# Patient Record
Sex: Female | Born: 1965
Health system: Southern US, Community
[De-identification: ages and names within clinical notes are randomized; demographics above are authoritative.]

## PROBLEM LIST (undated history)

## (undated) DIAGNOSIS — Z8742 Personal history of other diseases of the female genital tract: Secondary | ICD-10-CM

## (undated) DIAGNOSIS — E785 Hyperlipidemia, unspecified: Secondary | ICD-10-CM

## (undated) DIAGNOSIS — J329 Chronic sinusitis, unspecified: Secondary | ICD-10-CM

## (undated) DIAGNOSIS — F32A Depression, unspecified: Secondary | ICD-10-CM

## (undated) DIAGNOSIS — R739 Hyperglycemia, unspecified: Secondary | ICD-10-CM

## (undated) DIAGNOSIS — F329 Major depressive disorder, single episode, unspecified: Secondary | ICD-10-CM

## (undated) DIAGNOSIS — N390 Urinary tract infection, site not specified: Secondary | ICD-10-CM

## (undated) HISTORY — DX: Urinary tract infection, site not specified: N39.0

## (undated) HISTORY — PX: CERVICAL BIOPSY: SHX590

## (undated) HISTORY — DX: Personal history of other diseases of the female genital tract: Z87.42

## (undated) HISTORY — DX: Depression, unspecified: F32.A

## (undated) HISTORY — PX: BREAST EXCISIONAL BIOPSY: SUR124

## (undated) HISTORY — DX: Hyperlipidemia, unspecified: E78.5

## (undated) HISTORY — DX: Hyperglycemia, unspecified: R73.9

## (undated) HISTORY — DX: Chronic sinusitis, unspecified: J32.9

## (undated) HISTORY — DX: Major depressive disorder, single episode, unspecified: F32.9

---

## 1985-12-31 HISTORY — PX: BREAST BIOPSY: SHX20

## 2000-02-14 ENCOUNTER — Other Ambulatory Visit: Admission: RE | Admit: 2000-02-14 | Discharge: 2000-02-14 | Payer: Self-pay | Admitting: Internal Medicine

## 2001-03-03 ENCOUNTER — Other Ambulatory Visit: Admission: RE | Admit: 2001-03-03 | Discharge: 2001-03-03 | Payer: Self-pay | Admitting: Internal Medicine

## 2002-06-22 ENCOUNTER — Other Ambulatory Visit: Admission: RE | Admit: 2002-06-22 | Discharge: 2002-06-22 | Payer: Self-pay | Admitting: Internal Medicine

## 2004-05-23 ENCOUNTER — Other Ambulatory Visit: Admission: RE | Admit: 2004-05-23 | Discharge: 2004-05-23 | Payer: Self-pay | Admitting: Internal Medicine

## 2004-09-07 ENCOUNTER — Encounter: Admission: RE | Admit: 2004-09-07 | Discharge: 2004-09-07 | Payer: Self-pay | Admitting: Internal Medicine

## 2004-11-08 ENCOUNTER — Ambulatory Visit: Payer: Self-pay | Admitting: Internal Medicine

## 2005-06-12 ENCOUNTER — Other Ambulatory Visit: Admission: RE | Admit: 2005-06-12 | Discharge: 2005-06-12 | Payer: Self-pay | Admitting: Internal Medicine

## 2005-06-12 ENCOUNTER — Ambulatory Visit: Payer: Self-pay | Admitting: Internal Medicine

## 2005-06-25 ENCOUNTER — Ambulatory Visit: Payer: Self-pay | Admitting: Internal Medicine

## 2005-08-13 ENCOUNTER — Ambulatory Visit: Payer: Self-pay | Admitting: Internal Medicine

## 2005-08-20 ENCOUNTER — Ambulatory Visit: Payer: Self-pay | Admitting: Internal Medicine

## 2005-09-13 ENCOUNTER — Encounter: Admission: RE | Admit: 2005-09-13 | Discharge: 2005-09-13 | Payer: Self-pay | Admitting: Internal Medicine

## 2006-07-23 ENCOUNTER — Ambulatory Visit: Payer: Self-pay | Admitting: Internal Medicine

## 2006-07-30 ENCOUNTER — Ambulatory Visit: Payer: Self-pay | Admitting: Internal Medicine

## 2006-07-30 ENCOUNTER — Encounter: Payer: Self-pay | Admitting: Internal Medicine

## 2006-07-30 ENCOUNTER — Other Ambulatory Visit: Admission: RE | Admit: 2006-07-30 | Discharge: 2006-07-30 | Payer: Self-pay | Admitting: Internal Medicine

## 2006-09-16 ENCOUNTER — Encounter: Admission: RE | Admit: 2006-09-16 | Discharge: 2006-09-16 | Payer: Self-pay | Admitting: Internal Medicine

## 2006-09-16 IMAGING — MG MM SCREEN MAMMOGRAM BILATERAL
4 series · 4 of 4 positions shown · non-contrast
Comparison: none

DG SCREEN MAMMOGRAM BILATERAL
Bilateral CC and MLO view(s) were taken.

DIGITAL SCREENING MAMMOGRAM WITH CAD:
There is a fibroglandular pattern.  No masses or malignant type calcifications are identified.  
Compared with prior studies.

[R CC]
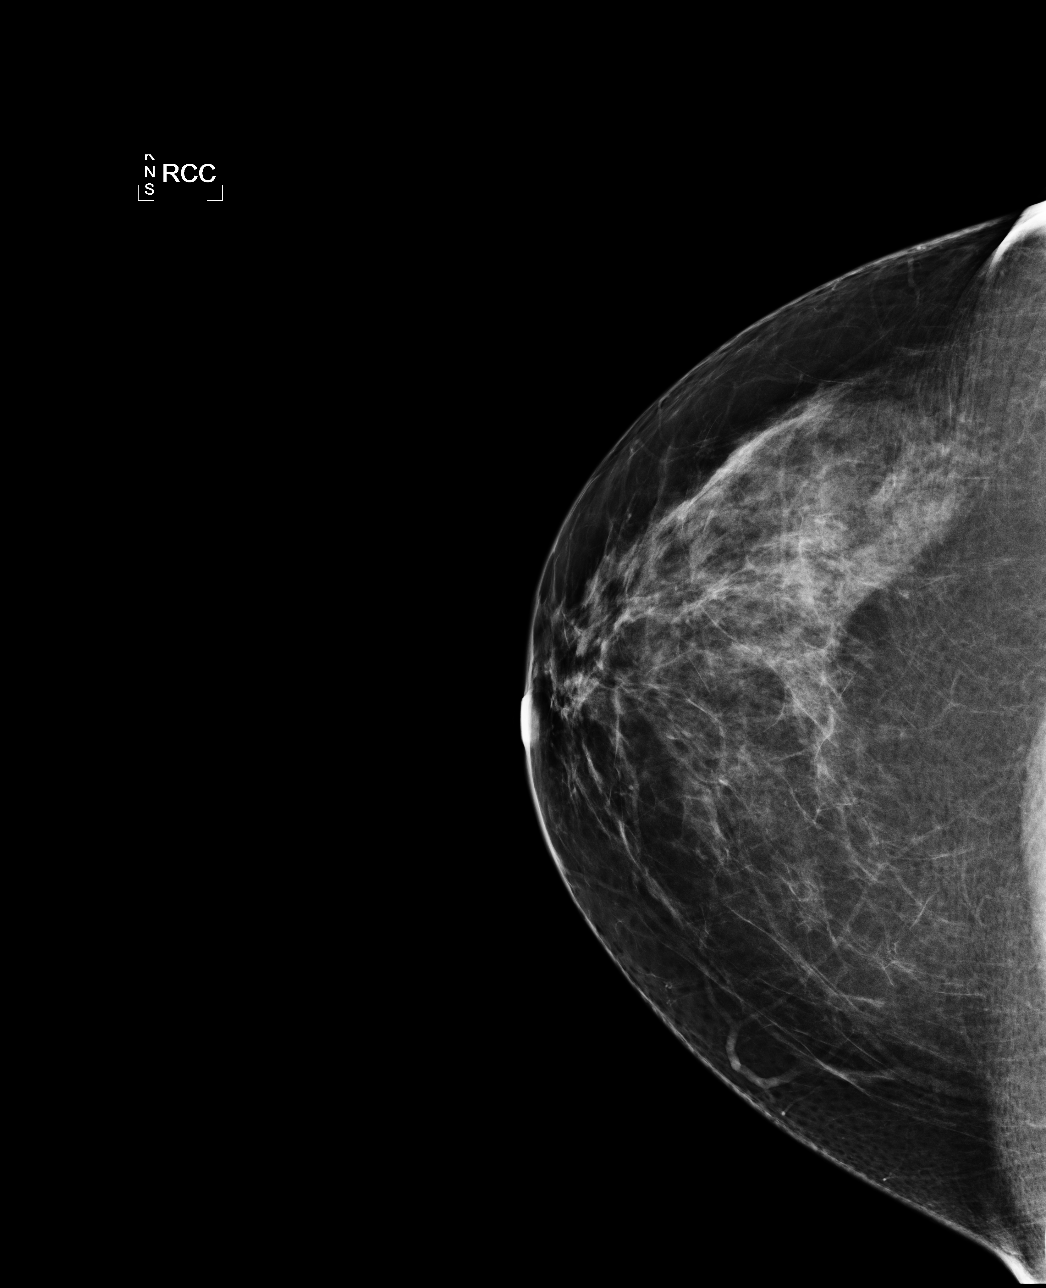

[L CC]
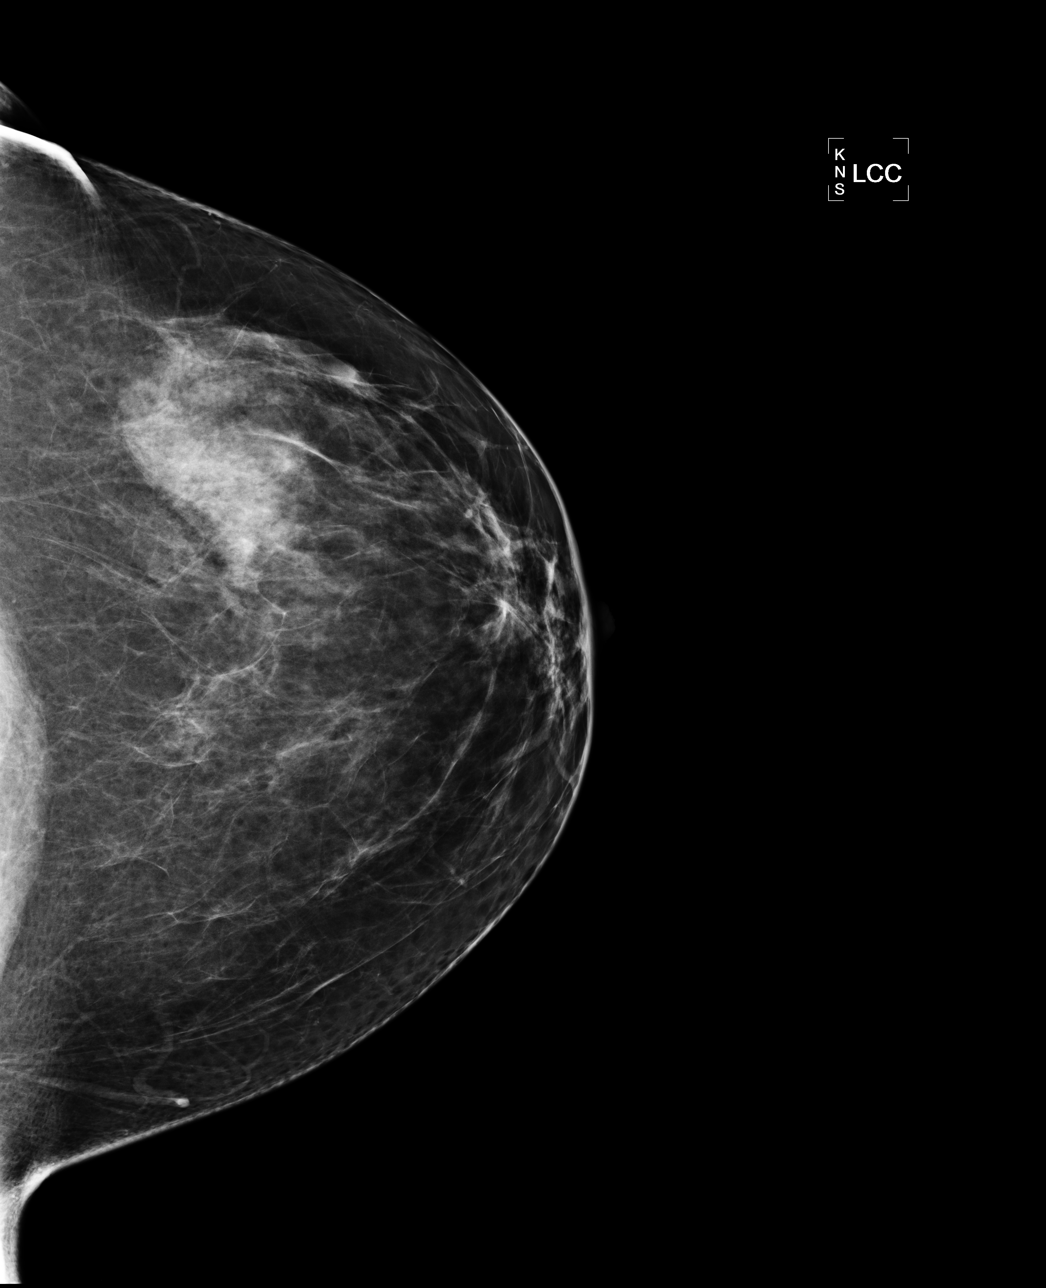

[L MLO]
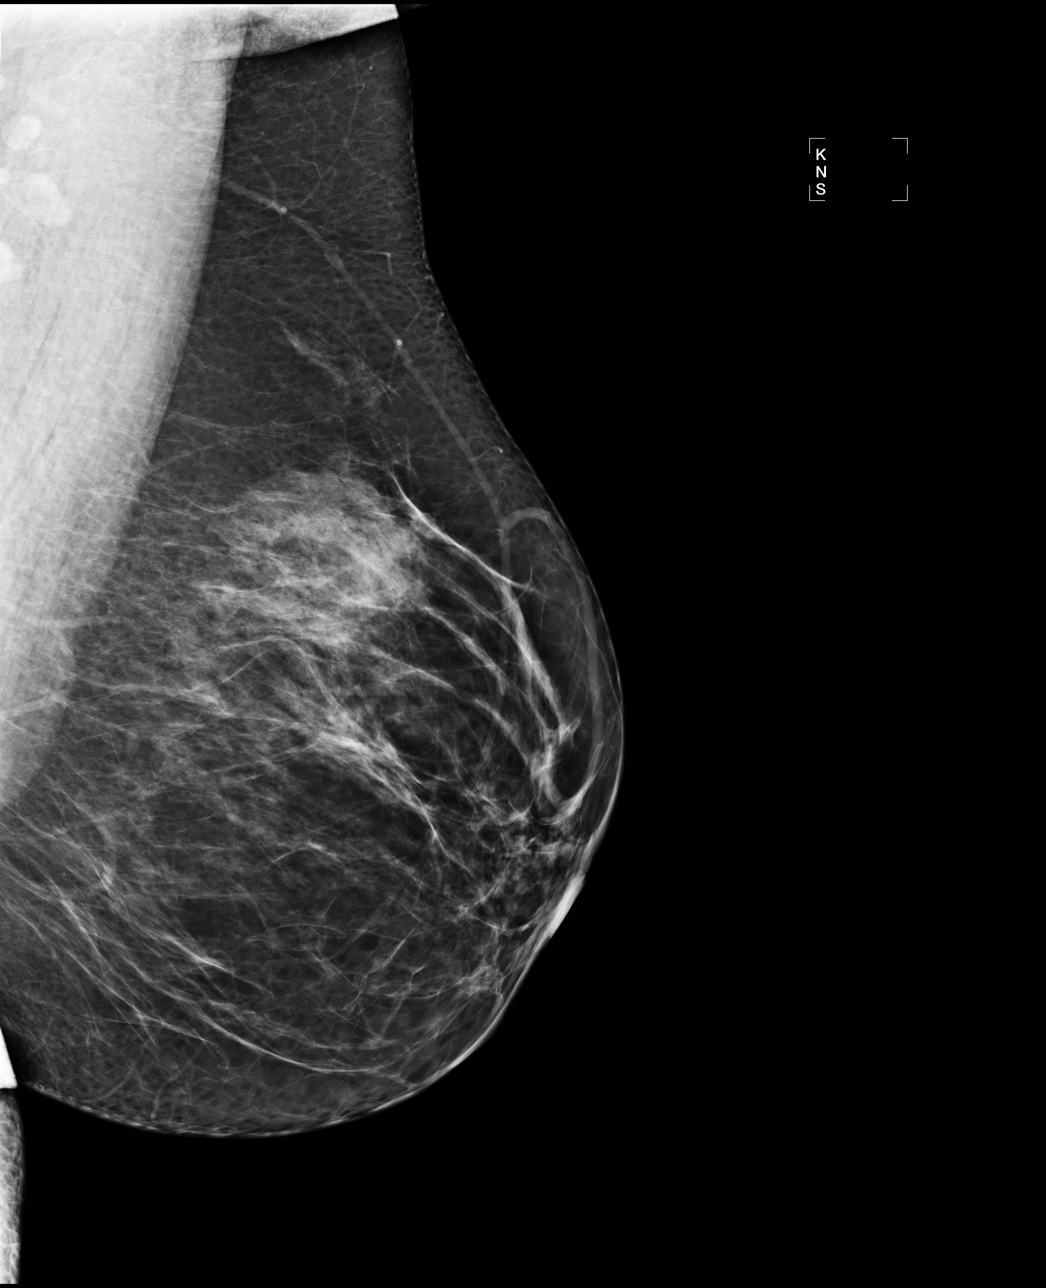

[R MLO]
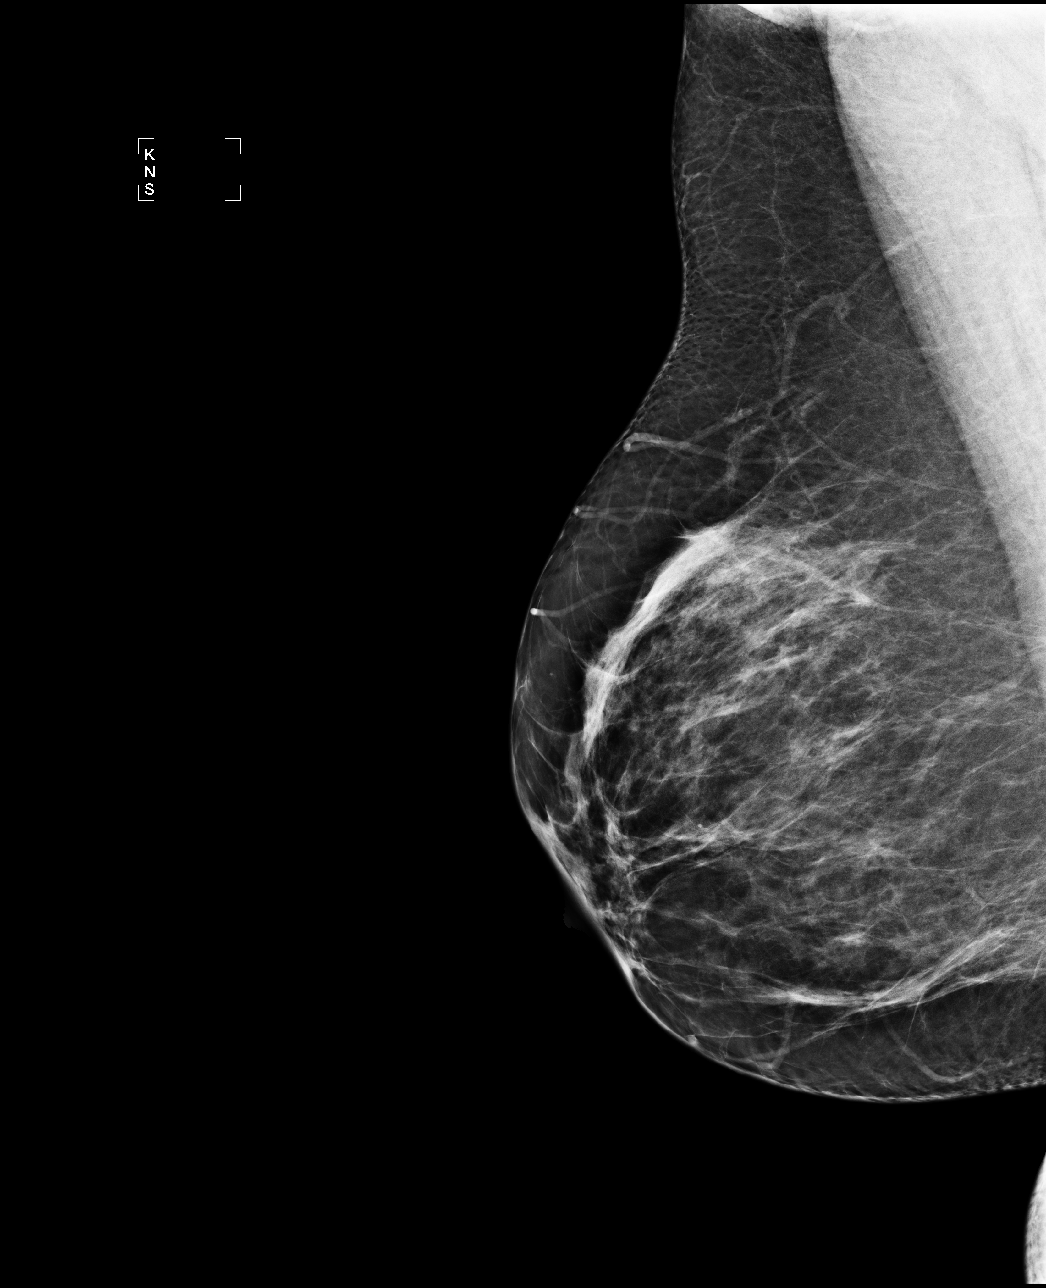

[4 of 4 positions shown; findings below may reference images not displayed]

IMPRESSION: No specific mammographic evidence of malignancy.  Next screening mammogram is recommended in one 
year.

ASSESSMENT: Negative - BI-RADS 1

Screening mammogram in 1 year.
ANALYZED BY COMPUTER AIDED DETECTION. , THIS PROCEDURE WAS A DIGITAL MAMMOGRAM.

## 2006-10-25 ENCOUNTER — Ambulatory Visit: Payer: Self-pay | Admitting: Internal Medicine

## 2007-09-18 ENCOUNTER — Encounter: Admission: RE | Admit: 2007-09-18 | Discharge: 2007-09-18 | Payer: Self-pay | Admitting: Internal Medicine

## 2007-09-18 IMAGING — MG MM SCREEN MAMMOGRAM BILATERAL
4 series · 4 of 4 positions shown · non-contrast
Comparison: none

DG SCREEN MAMMOGRAM BILATERAL
Bilateral CC and MLO view(s) were taken.

DIGITAL SCREENING MAMMOGRAM WITH CAD:
There are scattered fibroglandular densities.  No masses or malignant type calcifications are 
identified.  Compared with prior studies.

[R CC]
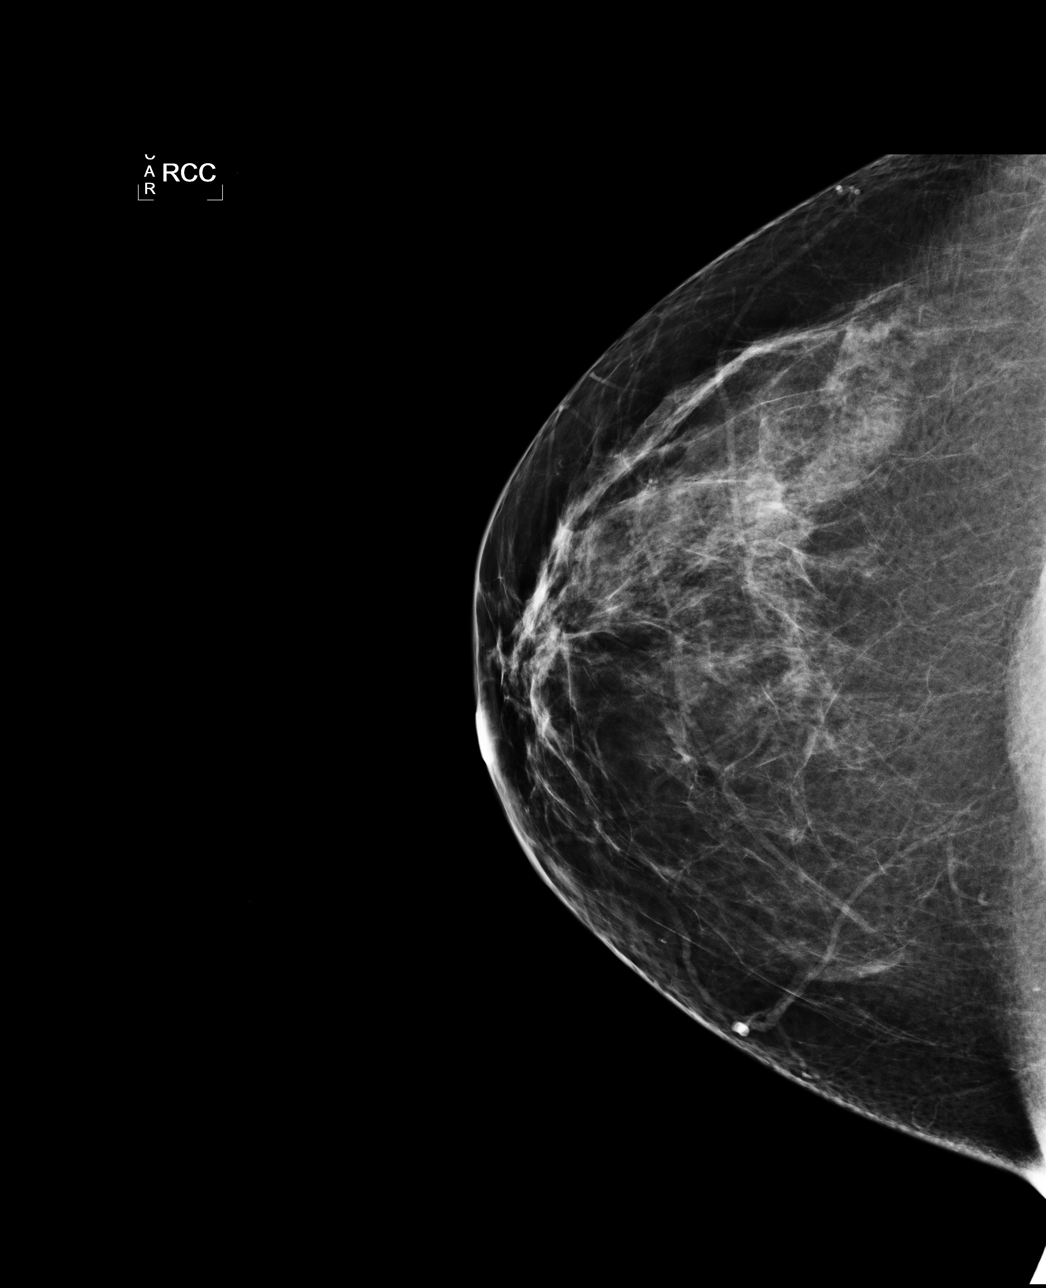

[L CC]
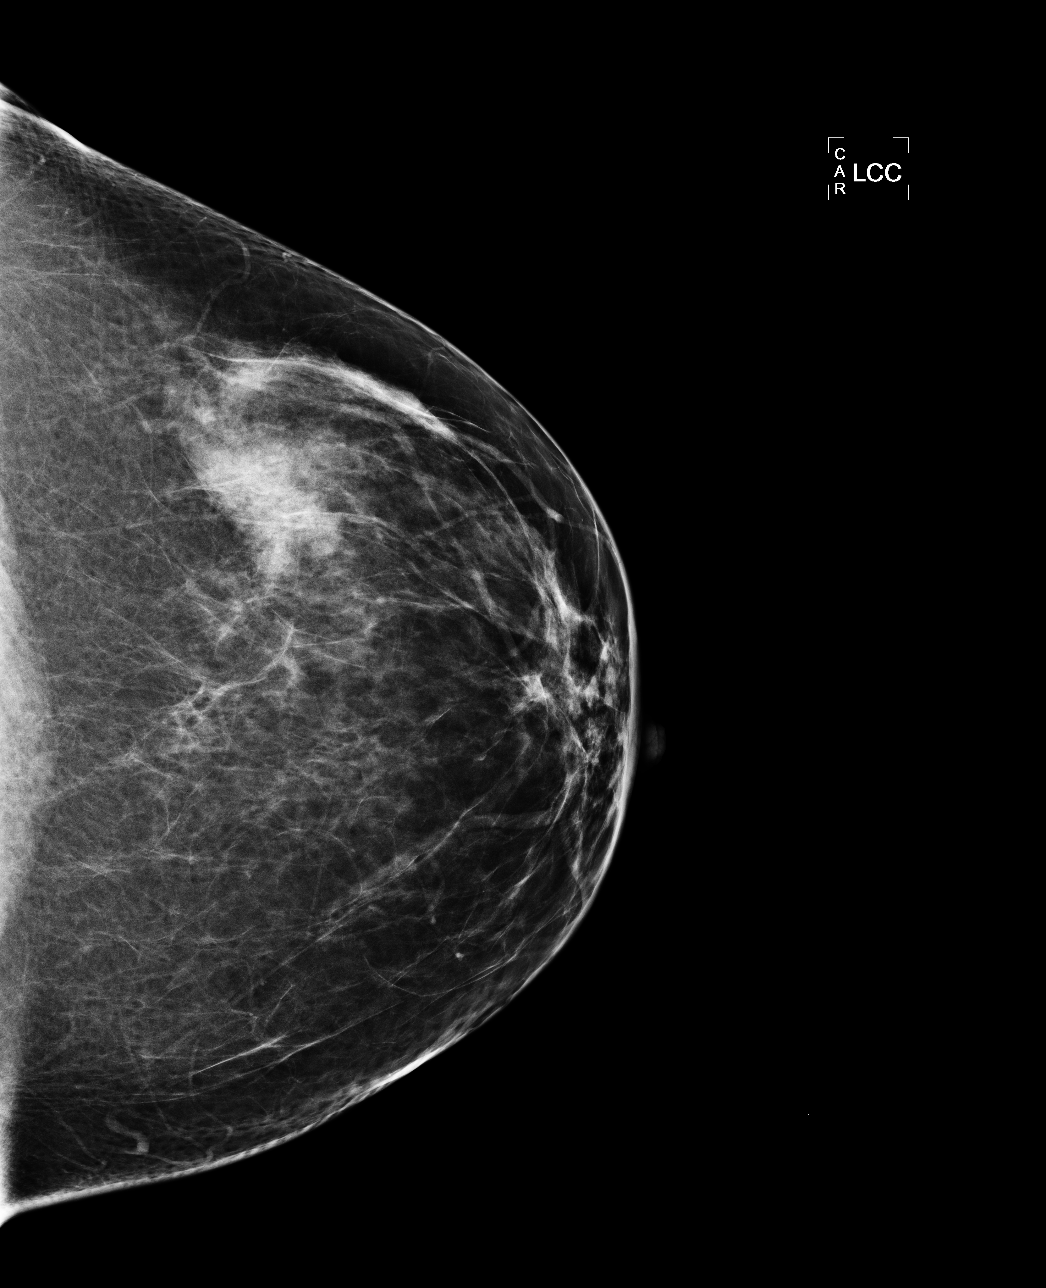

[L MLO]
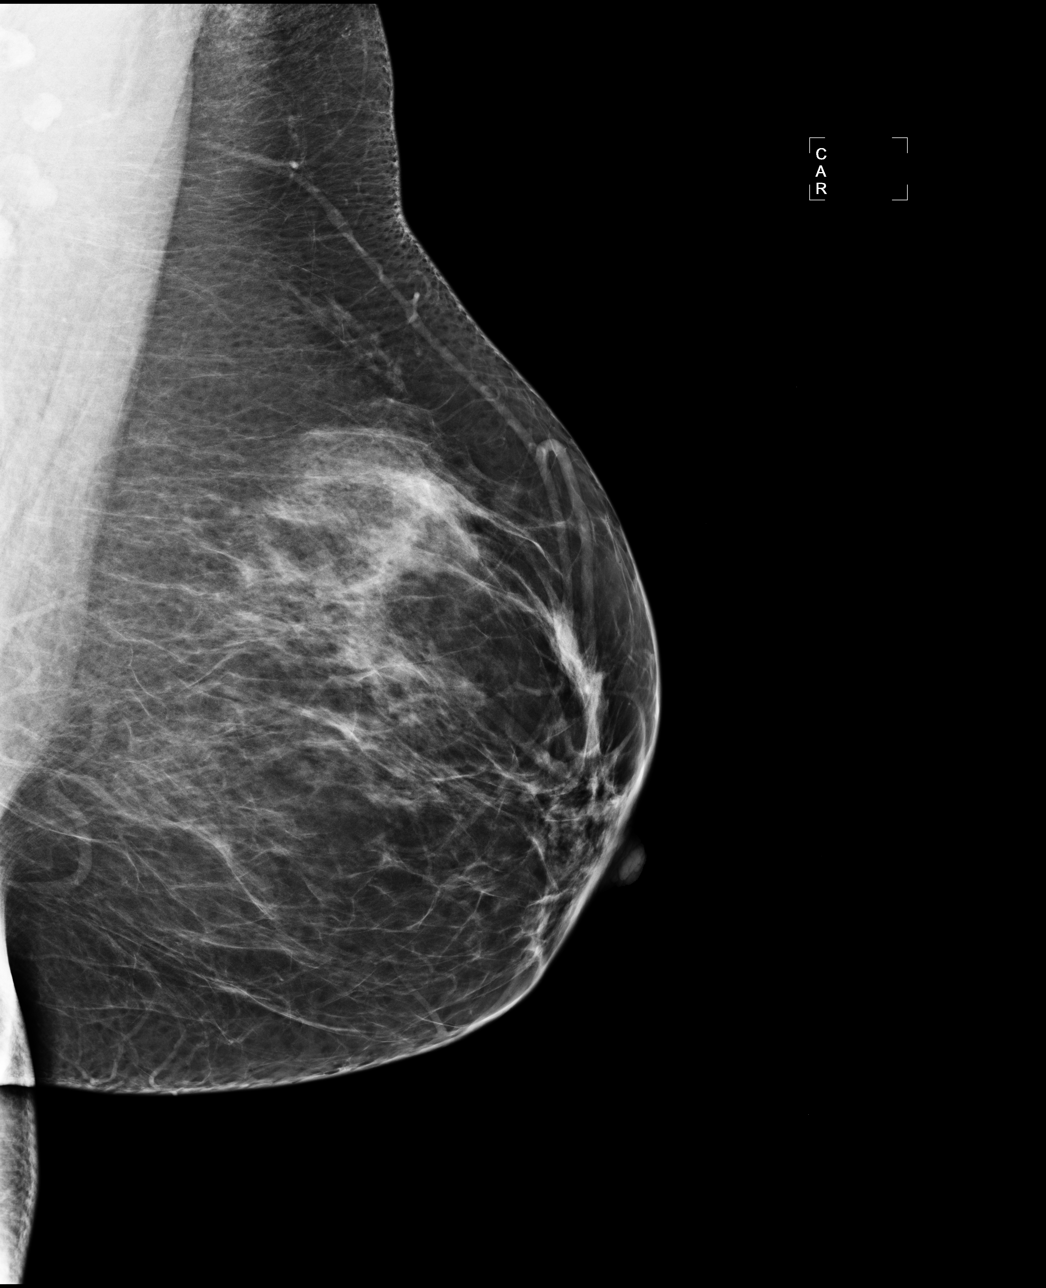

[R MLO]
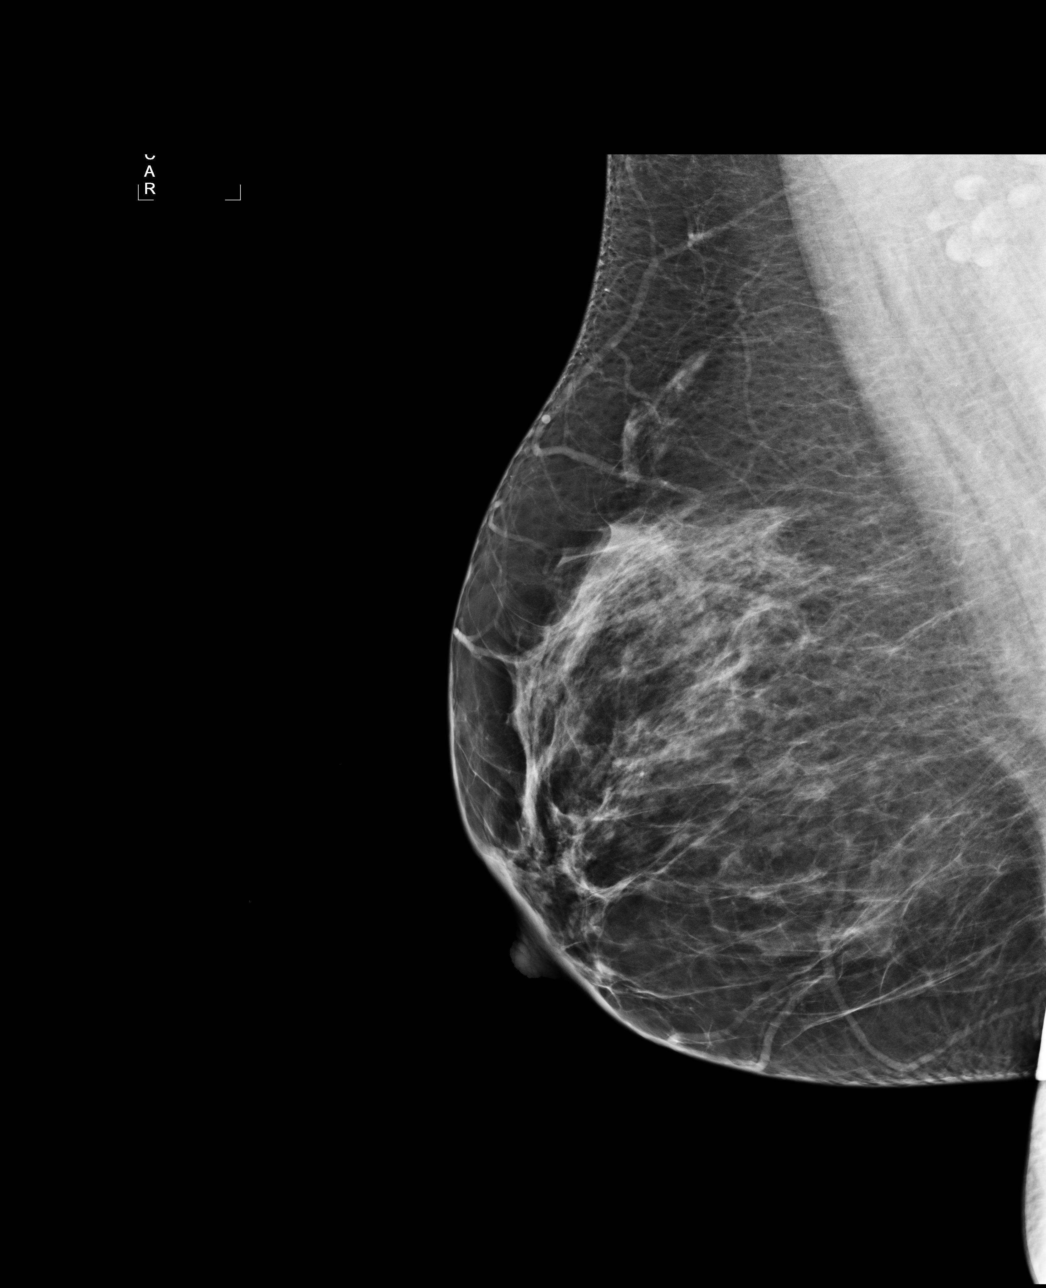

[4 of 4 positions shown; findings below may reference images not displayed]

IMPRESSION: No specific mammographic evidence of malignancy.  Next screening mammogram is recommended in one 
year.

ASSESSMENT: Negative - BI-RADS 1

Screening mammogram in 1 year.
ANALYZED BY COMPUTER AIDED DETECTION. , THIS PROCEDURE WAS A DIGITAL MAMMOGRAM.

## 2007-10-01 DIAGNOSIS — F329 Major depressive disorder, single episode, unspecified: Secondary | ICD-10-CM | POA: Insufficient documentation

## 2007-10-01 DIAGNOSIS — J309 Allergic rhinitis, unspecified: Secondary | ICD-10-CM | POA: Insufficient documentation

## 2007-10-30 ENCOUNTER — Ambulatory Visit: Payer: Self-pay | Admitting: Internal Medicine

## 2007-12-02 ENCOUNTER — Telehealth: Payer: Self-pay | Admitting: *Deleted

## 2007-12-22 ENCOUNTER — Ambulatory Visit: Payer: Self-pay | Admitting: Internal Medicine

## 2007-12-22 LAB — CONVERTED CEMR LAB
Bilirubin Urine: NEGATIVE
Glucose, Urine, Semiquant: NEGATIVE
Ketones, urine, test strip: NEGATIVE
Nitrite: NEGATIVE
Protein, U semiquant: NEGATIVE
Specific Gravity, Urine: 1.02
Urobilinogen, UA: 0.2
WBC Urine, dipstick: NEGATIVE
pH: 5

## 2007-12-25 LAB — CONVERTED CEMR LAB
ALT: 31 units/L (ref 0–35)
AST: 27 units/L (ref 0–37)
Albumin: 4.2 g/dL (ref 3.5–5.2)
Alkaline Phosphatase: 51 units/L (ref 39–117)
BUN: 12 mg/dL (ref 6–23)
Basophils Absolute: 0 10*3/uL (ref 0.0–0.1)
Basophils Relative: 0.3 % (ref 0.0–1.0)
Bilirubin, Direct: 0.1 mg/dL (ref 0.0–0.3)
CO2: 25 meq/L (ref 19–32)
Calcium: 9.3 mg/dL (ref 8.4–10.5)
Chloride: 104 meq/L (ref 96–112)
Cholesterol: 145 mg/dL (ref 0–200)
Creatinine, Ser: 0.9 mg/dL (ref 0.4–1.2)
Eosinophils Absolute: 0.3 10*3/uL (ref 0.0–0.6)
Eosinophils Relative: 4.2 % (ref 0.0–5.0)
GFR calc Af Amer: 88 mL/min
GFR calc non Af Amer: 73 mL/min
Glucose, Bld: 108 mg/dL — ABNORMAL HIGH (ref 70–99)
HCT: 38.5 % (ref 36.0–46.0)
HDL: 27.8 mg/dL — ABNORMAL LOW (ref >39.0)
Hemoglobin: 13.2 g/dL (ref 12.0–15.0)
LDL Cholesterol: 105 mg/dL — ABNORMAL HIGH (ref 0–99)
Lymphocytes Relative: 28.9 % (ref 12.0–46.0)
MCHC: 34.1 g/dL (ref 30.0–36.0)
MCV: 88.8 fL (ref 78.0–100.0)
Monocytes Absolute: 0.5 10*3/uL (ref 0.2–0.7)
Monocytes Relative: 7.7 % (ref 3.0–11.0)
Neutro Abs: 3.5 10*3/uL (ref 1.4–7.7)
Neutrophils Relative %: 58.9 % (ref 43.0–77.0)
Platelets: 246 10*3/uL (ref 150–400)
Potassium: 4.2 meq/L (ref 3.5–5.1)
RBC: 4.34 M/uL (ref 3.87–5.11)
RDW: 12.8 % (ref 11.5–14.6)
Sodium: 138 meq/L (ref 135–145)
TSH: 3.48 microintl units/mL (ref 0.35–5.50)
Total Bilirubin: 0.7 mg/dL (ref 0.3–1.2)
Total CHOL/HDL Ratio: 5.2
Total Protein: 6.4 g/dL (ref 6.0–8.3)
Triglycerides: 62 mg/dL (ref 0–149)
VLDL: 12 mg/dL (ref 0–40)
WBC: 6.1 10*3/uL (ref 4.5–10.5)

## 2007-12-30 ENCOUNTER — Other Ambulatory Visit: Admission: RE | Admit: 2007-12-30 | Discharge: 2007-12-30 | Payer: Self-pay | Admitting: Internal Medicine

## 2007-12-30 ENCOUNTER — Encounter: Payer: Self-pay | Admitting: Internal Medicine

## 2007-12-30 ENCOUNTER — Ambulatory Visit: Payer: Self-pay | Admitting: Internal Medicine

## 2007-12-30 DIAGNOSIS — E782 Mixed hyperlipidemia: Secondary | ICD-10-CM | POA: Insufficient documentation

## 2007-12-30 DIAGNOSIS — N946 Dysmenorrhea, unspecified: Secondary | ICD-10-CM | POA: Insufficient documentation

## 2007-12-30 DIAGNOSIS — R7309 Other abnormal glucose: Secondary | ICD-10-CM | POA: Insufficient documentation

## 2008-09-20 ENCOUNTER — Encounter: Admission: RE | Admit: 2008-09-20 | Discharge: 2008-09-20 | Payer: Self-pay | Admitting: Internal Medicine

## 2008-09-20 IMAGING — MG MM SCREEN MAMMOGRAM BILATERAL
4 series · 4 of 4 positions shown · non-contrast
Comparison: Prior studies.

DG SCREEN MAMMOGRAM BILATERAL
Bilateral CC and MLO view(s) were taken.
Prior study comparison: September 16, 2006, DG screen mammogram bilateral.

DIGITAL SCREENING MAMMOGRAM WITH CAD:

[R CC]
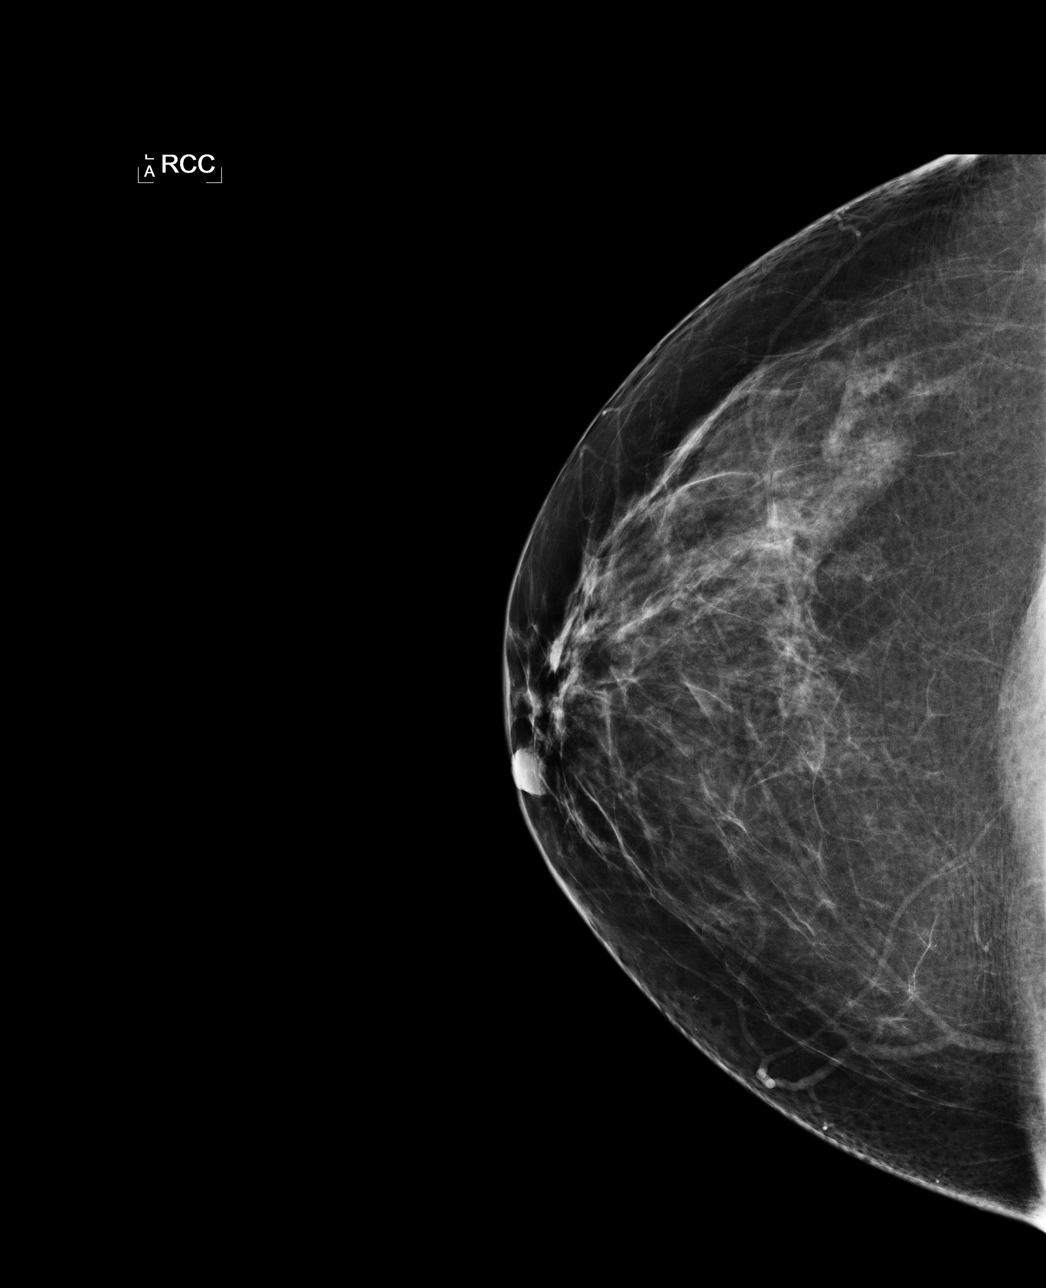

[L CC]
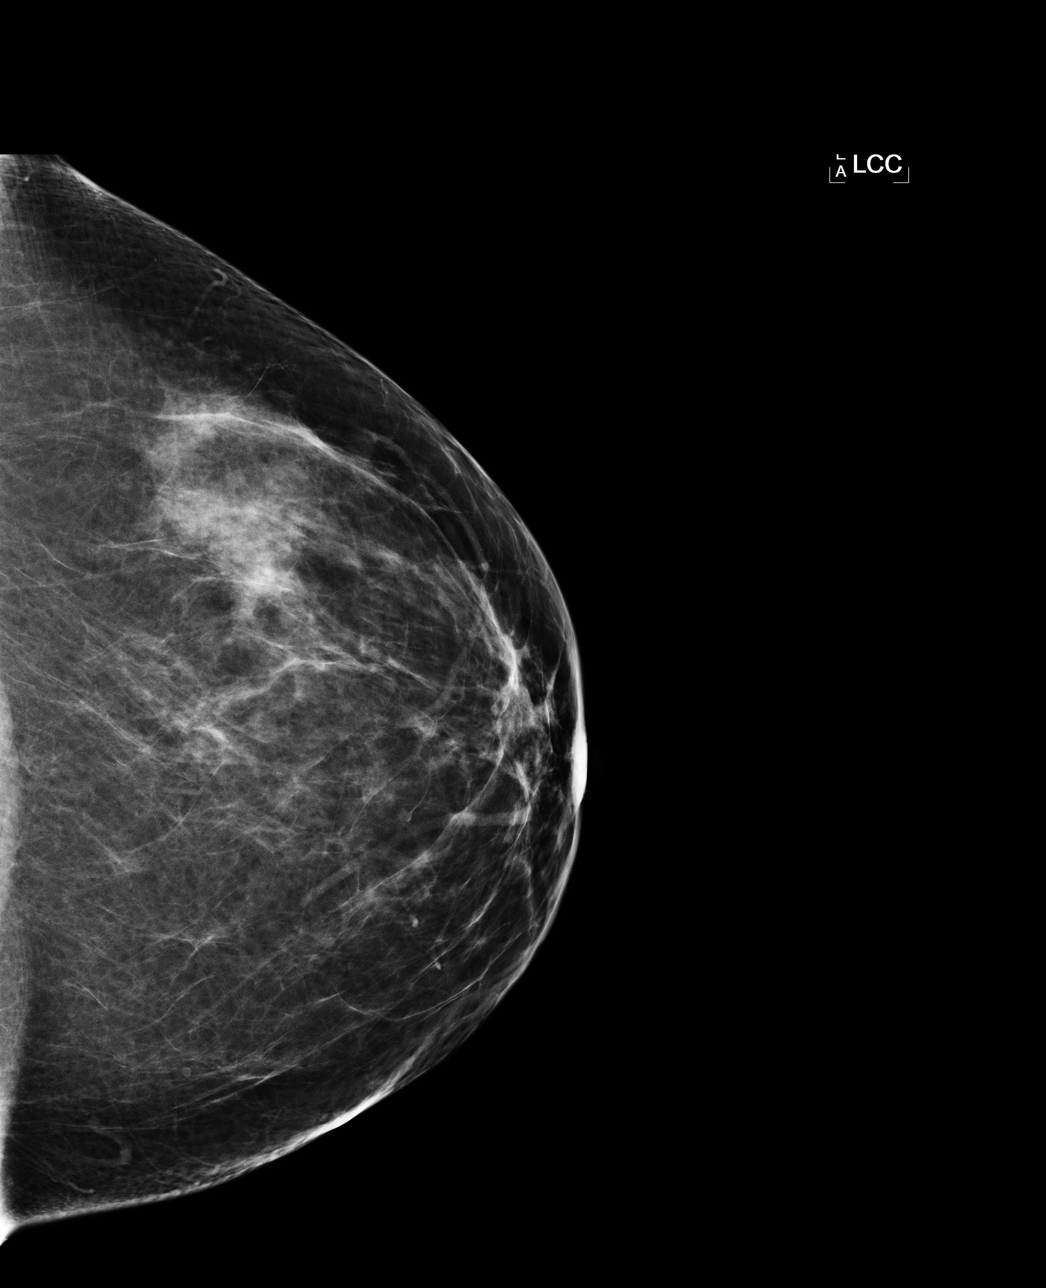

[L MLO]
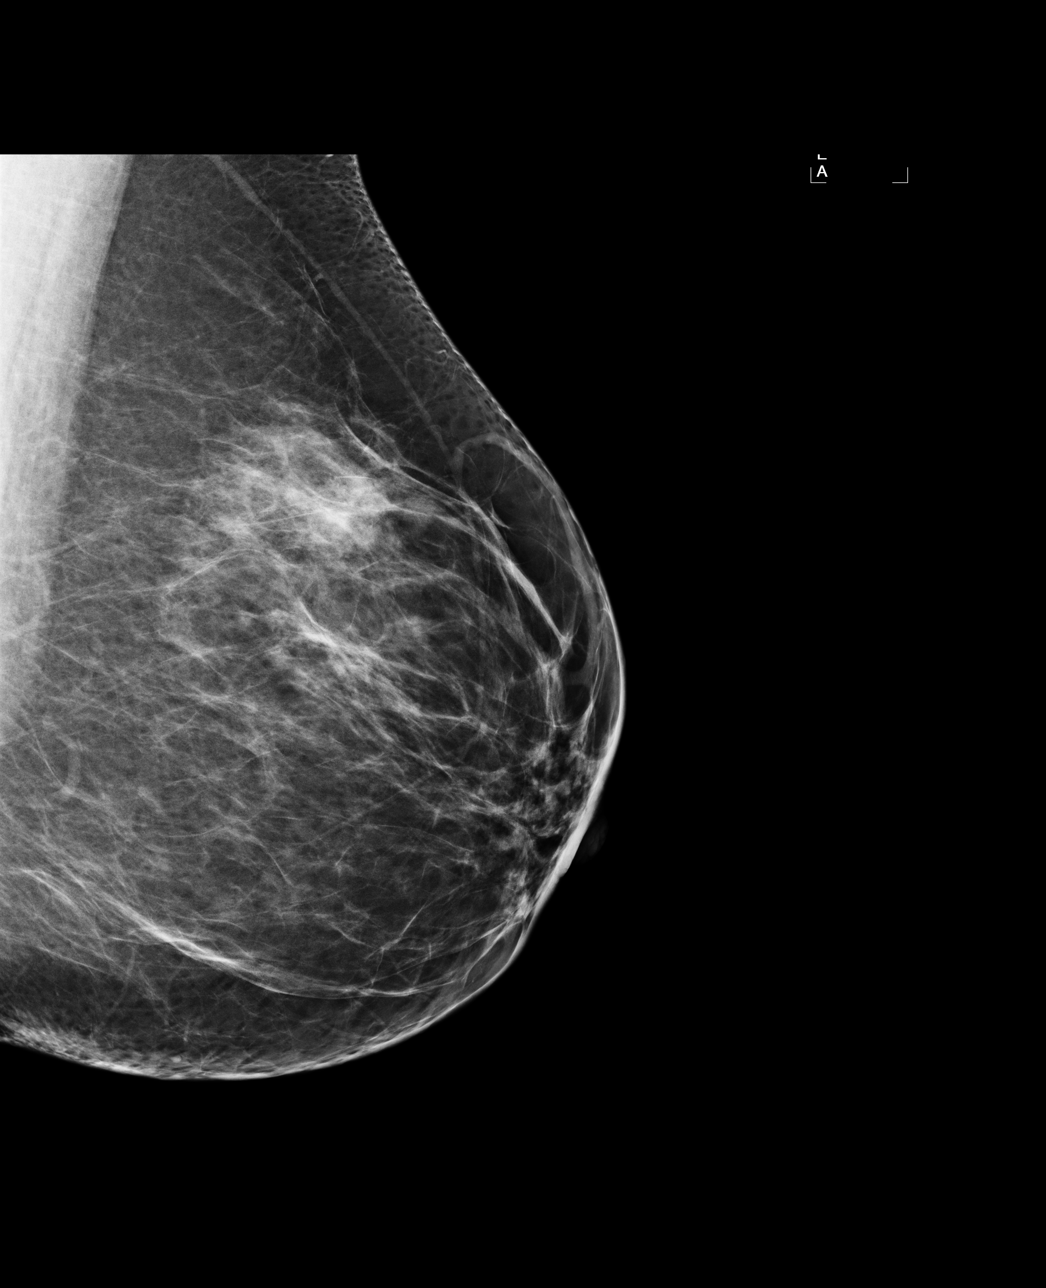

[R MLO]
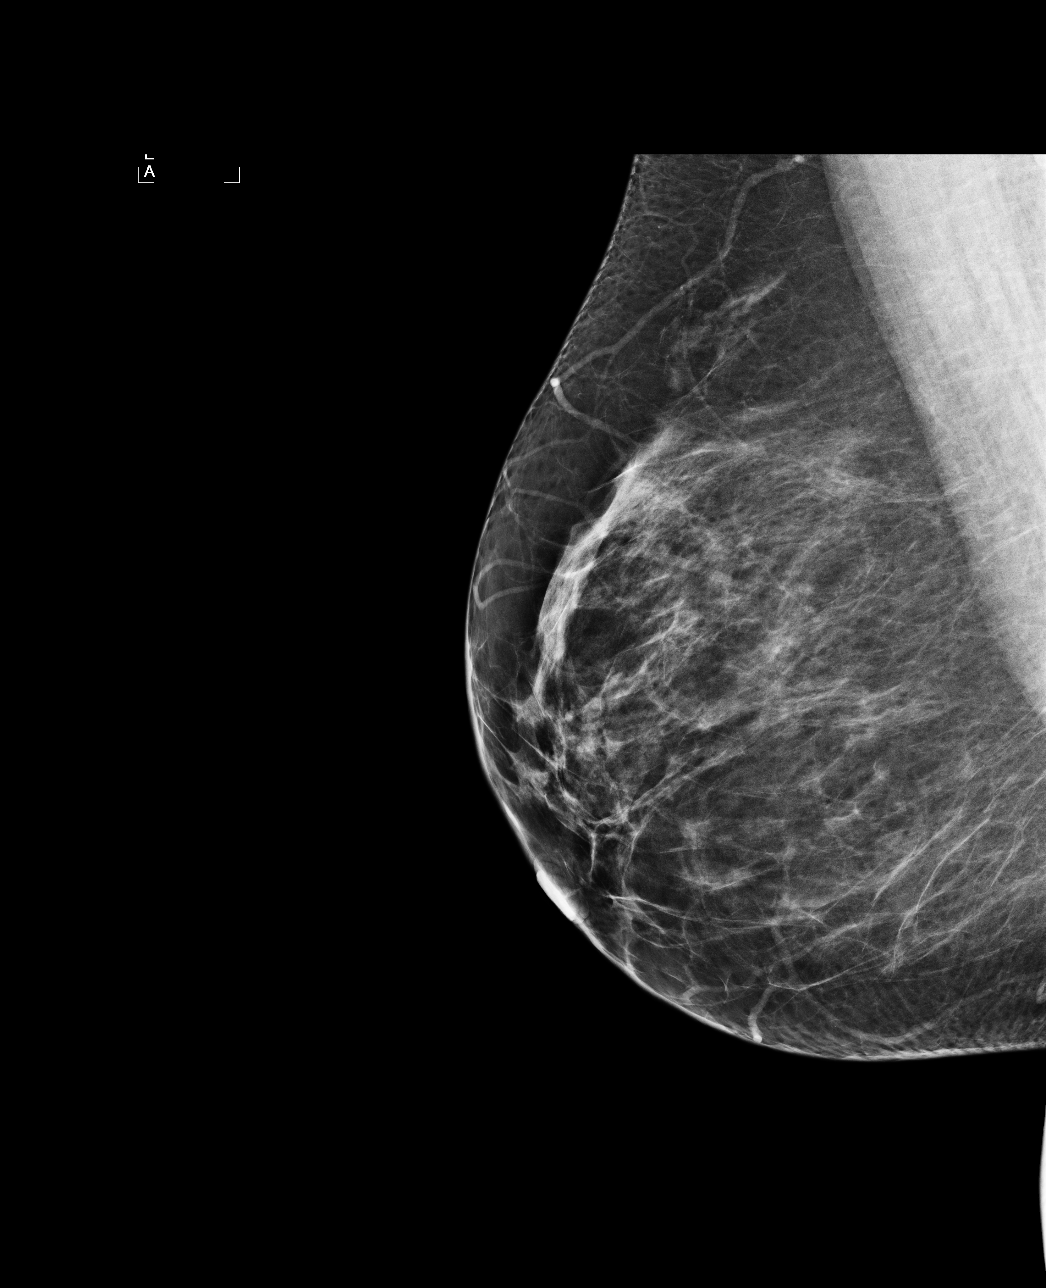

[4 of 4 positions shown; findings below may reference images not displayed]

There are scattered fibroglandular densities.  There is no dominant mass, architectural distortion 
or calcification to suggest malignancy.
IMPRESSION: No mammographic evidence of malignancy.  Suggest yearly screening mammography.

ASSESSMENT: Negative - BI-RADS 1

Screening mammogram in 1 year.
ANALYZED BY COMPUTER AIDED DETECTION. , THIS PROCEDURE WAS A DIGITAL MAMMOGRAM.

## 2008-10-01 ENCOUNTER — Ambulatory Visit: Payer: Self-pay | Admitting: Internal Medicine

## 2008-12-15 ENCOUNTER — Ambulatory Visit: Payer: Self-pay | Admitting: Internal Medicine

## 2008-12-15 LAB — CONVERTED CEMR LAB
ALT: 32 units/L (ref 0–35)
AST: 27 units/L (ref 0–37)
Albumin: 3.9 g/dL (ref 3.5–5.2)
Alkaline Phosphatase: 50 units/L (ref 39–117)
BUN: 15 mg/dL (ref 6–23)
Basophils Relative: 0 % (ref 0.0–3.0)
Bilirubin Urine: NEGATIVE
Bilirubin, Direct: 0.1 mg/dL (ref 0.0–0.3)
CO2: 27 meq/L (ref 19–32)
Calcium: 9.1 mg/dL (ref 8.4–10.5)
Chloride: 109 meq/L (ref 96–112)
Cholesterol: 124 mg/dL (ref 0–200)
Creatinine, Ser: 0.9 mg/dL (ref 0.4–1.2)
Eosinophils Relative: 5.5 % — ABNORMAL HIGH (ref 0.0–5.0)
GFR calc Af Amer: 87 mL/min
GFR calc non Af Amer: 72 mL/min
Glucose, Bld: 114 mg/dL — ABNORMAL HIGH (ref 70–99)
Glucose, Urine, Semiquant: NEGATIVE
HCT: 37.1 % (ref 36.0–46.0)
HDL: 36.1 mg/dL — ABNORMAL LOW (ref >39.0)
Hemoglobin: 12.6 g/dL (ref 12.0–15.0)
Ketones, urine, test strip: NEGATIVE
LDL Cholesterol: 75 mg/dL (ref 0–99)
Lymphocytes Relative: 30 % (ref 12.0–46.0)
MCHC: 34 g/dL (ref 30.0–36.0)
MCV: 88.8 fL (ref 78.0–100.0)
Monocytes Relative: 7.8 % (ref 3.0–12.0)
Neutrophils Relative %: 56.7 % (ref 43.0–77.0)
Nitrite: NEGATIVE
Platelets: 213 10*3/uL (ref 150–400)
Potassium: 4.2 meq/L (ref 3.5–5.1)
Protein, U semiquant: NEGATIVE
RBC: 4.18 M/uL (ref 3.87–5.11)
RDW: 12.5 % (ref 11.5–14.6)
Sodium: 141 meq/L (ref 135–145)
Specific Gravity, Urine: 1.01
TSH: 2.72 microintl units/mL (ref 0.35–5.50)
Total Bilirubin: 0.5 mg/dL (ref 0.3–1.2)
Total CHOL/HDL Ratio: 3.4
Total Protein: 6.5 g/dL (ref 6.0–8.3)
Triglycerides: 65 mg/dL (ref 0–149)
Urobilinogen, UA: 0.2
VLDL: 13 mg/dL (ref 0–40)
WBC Urine, dipstick: NEGATIVE
WBC: 5 10*3/uL (ref 4.5–10.5)
pH: 5.5

## 2008-12-22 ENCOUNTER — Ambulatory Visit: Payer: Self-pay | Admitting: Internal Medicine

## 2008-12-22 ENCOUNTER — Other Ambulatory Visit: Admission: RE | Admit: 2008-12-22 | Discharge: 2008-12-22 | Payer: Self-pay | Admitting: Internal Medicine

## 2009-09-22 ENCOUNTER — Ambulatory Visit: Payer: Self-pay | Admitting: Internal Medicine

## 2009-09-26 ENCOUNTER — Encounter: Admission: RE | Admit: 2009-09-26 | Discharge: 2009-09-26 | Payer: Self-pay | Admitting: Internal Medicine

## 2009-09-26 IMAGING — MG MM SCREEN MAMMOGRAM BILATERAL
4 series · 4 of 4 positions shown · non-contrast
Comparison: Prior studies.

DG SCREEN MAMMOGRAM BILATERAL
Bilateral CC and MLO view(s) were taken.

DIGITAL SCREENING MAMMOGRAM WITH CAD:

[R CC]
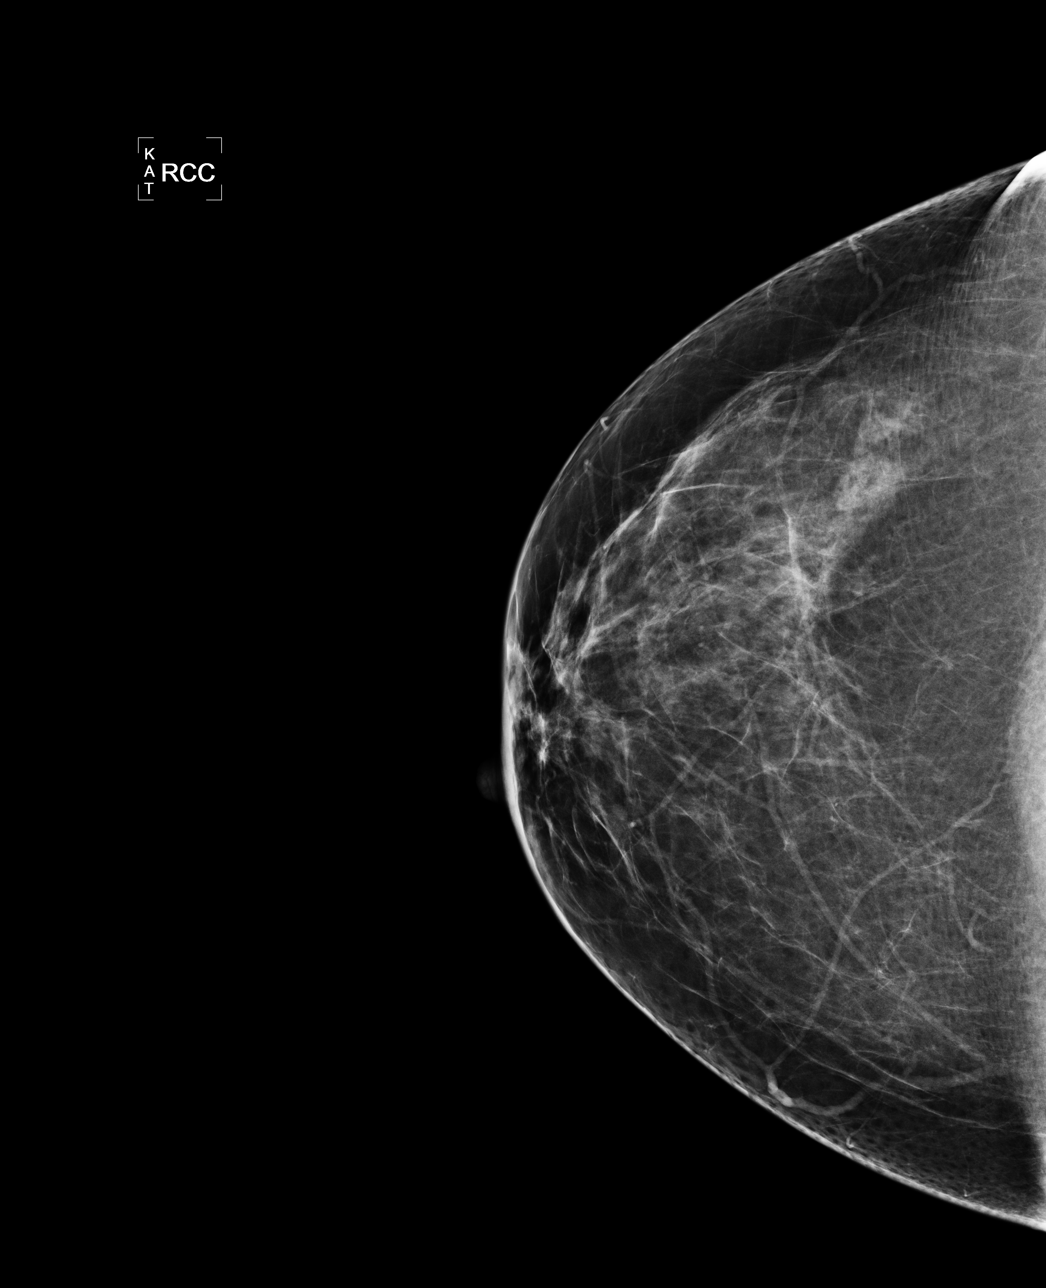

[L CC]
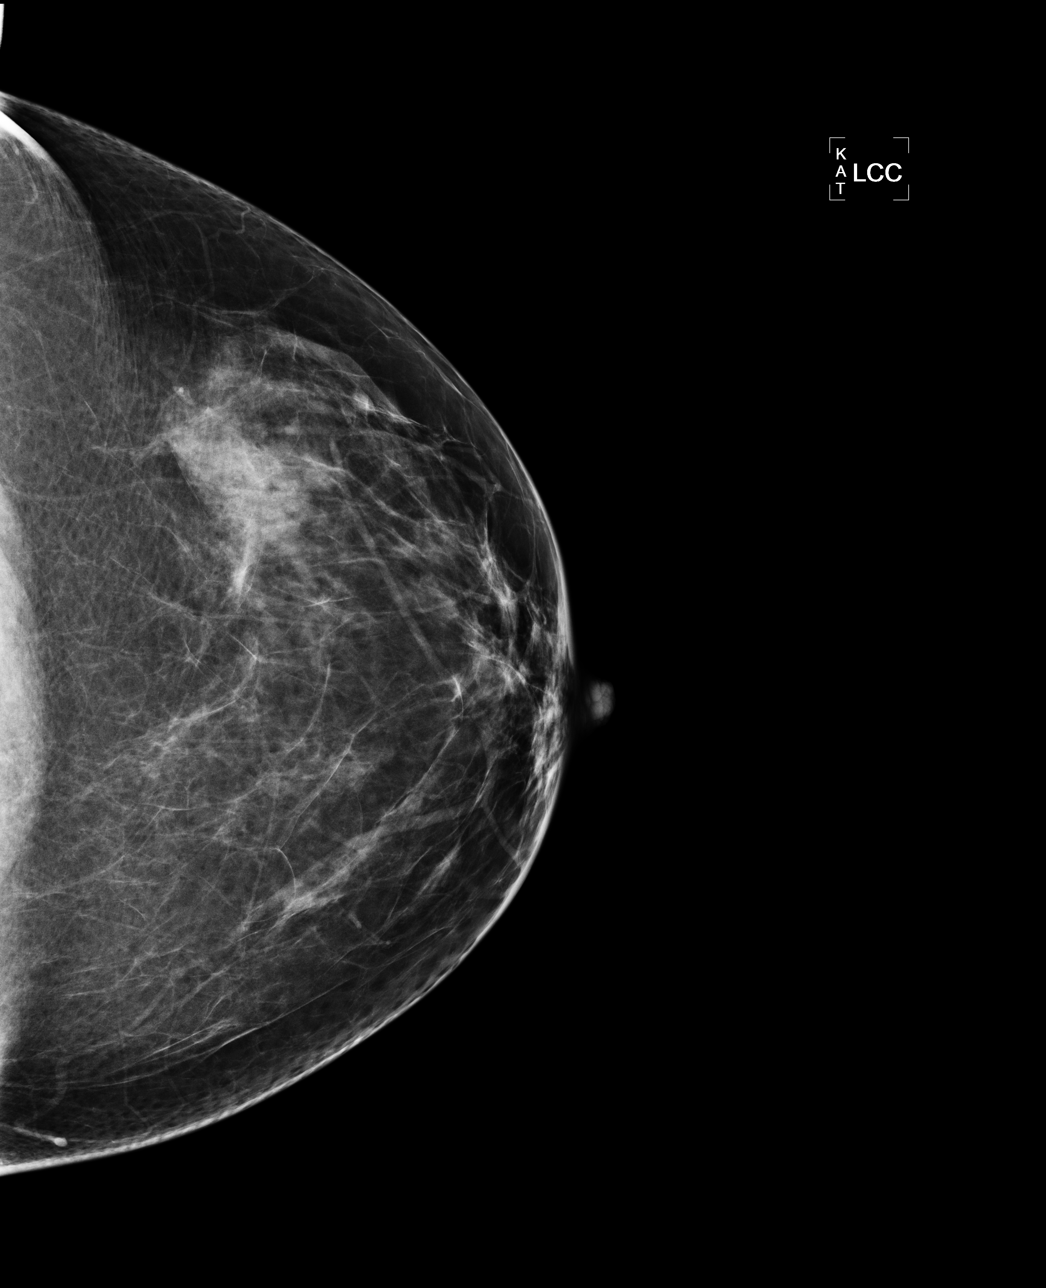

[L MLO]
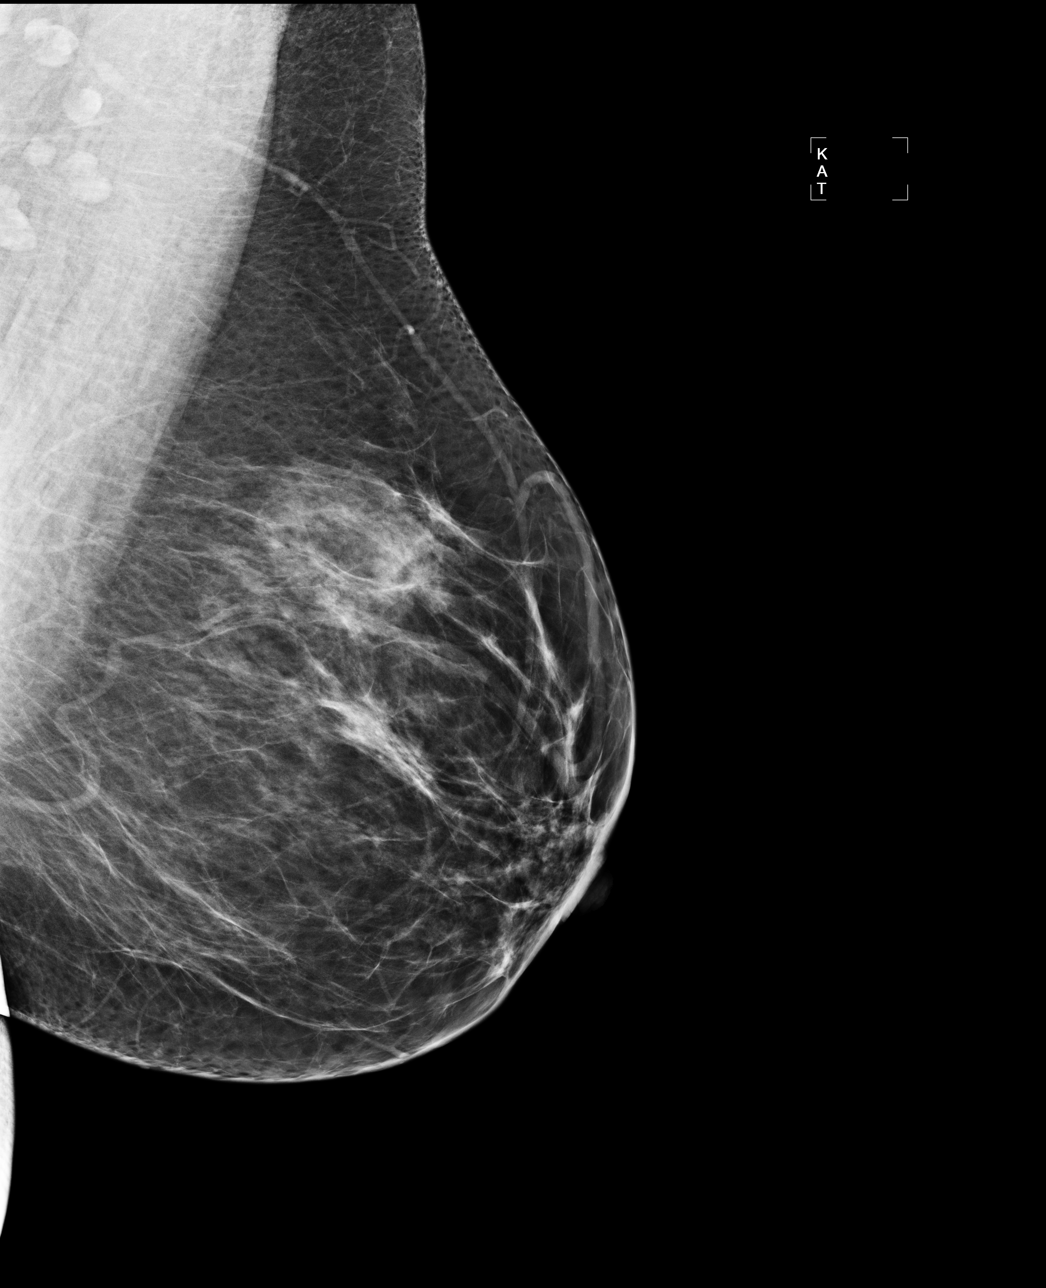

[R MLO]
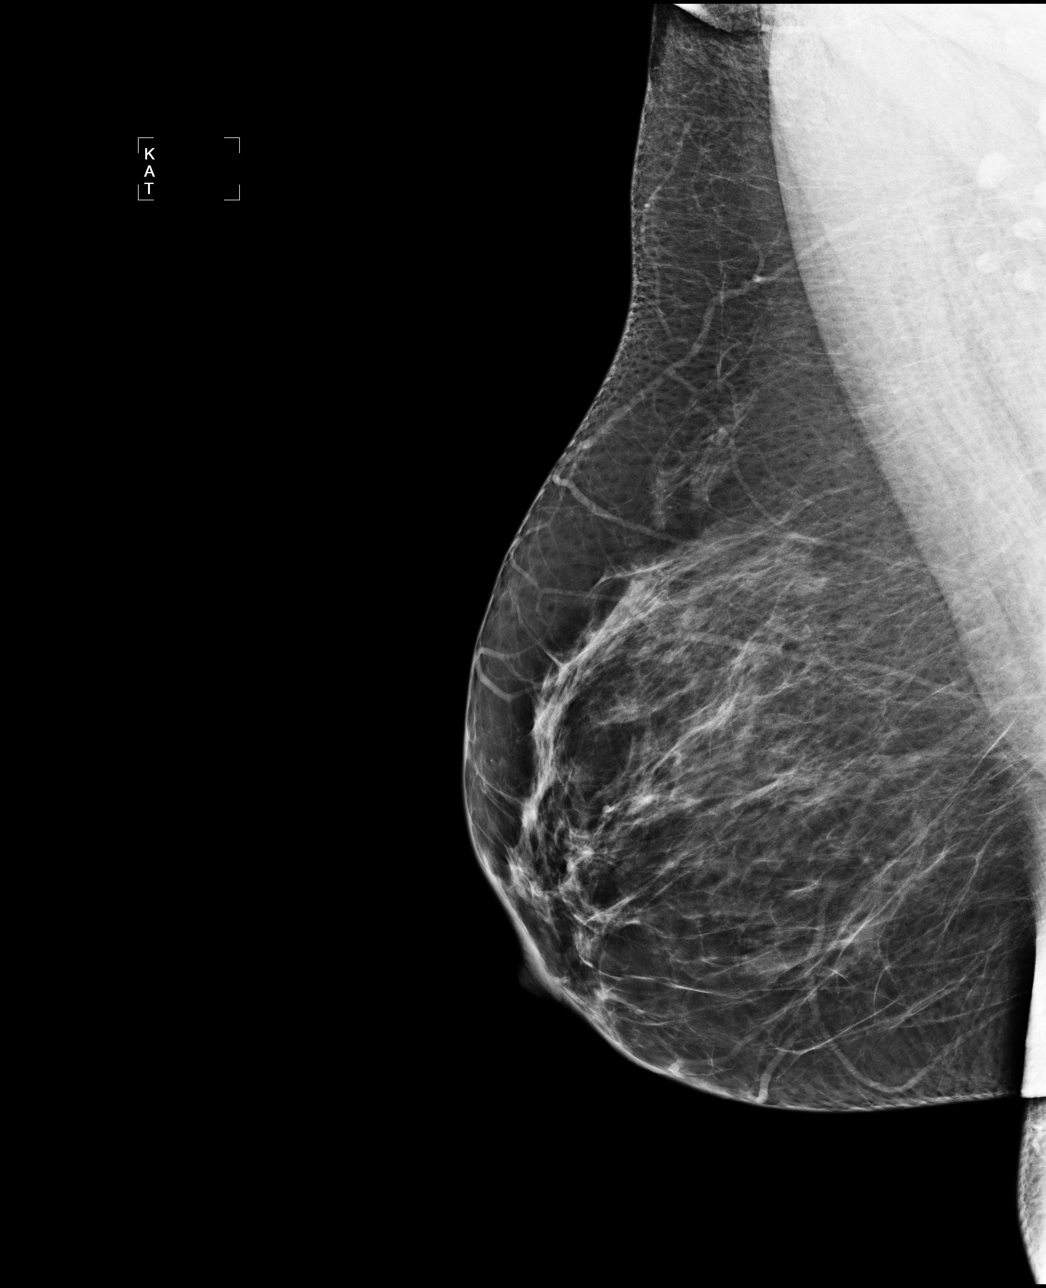

[4 of 4 positions shown; findings below may reference images not displayed]

There are scattered fibroglandular densities.  There is no dominant mass, architectural distortion 
or calcification to suggest malignancy.

Images were processed with CAD.
IMPRESSION: No mammographic evidence of malignancy.  Suggest yearly screening mammography.

A result letter of this screening mammogram will be mailed directly to the patient.

ASSESSMENT: Negative - BI-RADS 1

Screening mammogram in 1 year.
,

## 2010-01-30 ENCOUNTER — Ambulatory Visit: Payer: Self-pay | Admitting: Internal Medicine

## 2010-01-30 LAB — CONVERTED CEMR LAB
ALT: 27 units/L (ref 0–35)
AST: 24 units/L (ref 0–37)
Albumin: 4.2 g/dL (ref 3.5–5.2)
Alkaline Phosphatase: 53 units/L (ref 39–117)
BUN: 13 mg/dL (ref 6–23)
Basophils Absolute: 0 10*3/uL (ref 0.0–0.1)
Basophils Relative: 0.6 % (ref 0.0–3.0)
Bilirubin Urine: NEGATIVE
Bilirubin, Direct: 0.1 mg/dL (ref 0.0–0.3)
CO2: 26 meq/L (ref 19–32)
Calcium: 9 mg/dL (ref 8.4–10.5)
Chloride: 107 meq/L (ref 96–112)
Cholesterol: 147 mg/dL (ref 0–200)
Creatinine, Ser: 0.9 mg/dL (ref 0.4–1.2)
Eosinophils Absolute: 0.3 10*3/uL (ref 0.0–0.7)
Eosinophils Relative: 4.8 % (ref 0.0–5.0)
GFR calc non Af Amer: 71.7 mL/min (ref >60)
Glucose, Bld: 111 mg/dL — ABNORMAL HIGH (ref 70–99)
Glucose, Urine, Semiquant: NEGATIVE
HCT: 37.6 % (ref 36.0–46.0)
HDL: 49.1 mg/dL (ref >39.00)
Hemoglobin: 12.9 g/dL (ref 12.0–15.0)
Ketones, urine, test strip: NEGATIVE
LDL Cholesterol: 81 mg/dL (ref 0–99)
Lymphocytes Relative: 25.2 % (ref 12.0–46.0)
Lymphs Abs: 1.3 10*3/uL (ref 0.7–4.0)
MCHC: 34.4 g/dL (ref 30.0–36.0)
MCV: 89.4 fL (ref 78.0–100.0)
Monocytes Absolute: 0.4 10*3/uL (ref 0.1–1.0)
Monocytes Relative: 7.1 % (ref 3.0–12.0)
Neutro Abs: 3.3 10*3/uL (ref 1.4–7.7)
Neutrophils Relative %: 62.3 % (ref 43.0–77.0)
Nitrite: NEGATIVE
Platelets: 220 10*3/uL (ref 150.0–400.0)
Potassium: 4 meq/L (ref 3.5–5.1)
Protein, U semiquant: NEGATIVE
RBC: 4.2 M/uL (ref 3.87–5.11)
RDW: 12.8 % (ref 11.5–14.6)
Sodium: 138 meq/L (ref 135–145)
Specific Gravity, Urine: 1.01
TSH: 3.73 microintl units/mL (ref 0.35–5.50)
Total Bilirubin: 0.3 mg/dL (ref 0.3–1.2)
Total CHOL/HDL Ratio: 3
Total Protein: 7 g/dL (ref 6.0–8.3)
Triglycerides: 83 mg/dL (ref 0.0–149.0)
Urobilinogen, UA: 0.2
VLDL: 16.6 mg/dL (ref 0.0–40.0)
WBC Urine, dipstick: NEGATIVE
WBC: 5.3 10*3/uL (ref 4.5–10.5)
pH: 5

## 2010-02-07 ENCOUNTER — Other Ambulatory Visit: Admission: RE | Admit: 2010-02-07 | Discharge: 2010-02-07 | Payer: Self-pay | Admitting: Internal Medicine

## 2010-02-07 ENCOUNTER — Ambulatory Visit: Payer: Self-pay | Admitting: Internal Medicine

## 2010-02-07 DIAGNOSIS — R82998 Other abnormal findings in urine: Secondary | ICD-10-CM | POA: Insufficient documentation

## 2010-02-07 LAB — CONVERTED CEMR LAB
Bilirubin Urine: NEGATIVE
Glucose, Urine, Semiquant: NEGATIVE
Ketones, urine, test strip: NEGATIVE
Nitrite: NEGATIVE
Protein, U semiquant: NEGATIVE
Specific Gravity, Urine: >=1.03
Urobilinogen, UA: 0.2
WBC Urine, dipstick: NEGATIVE
pH: 5

## 2010-02-07 LAB — HM PAP SMEAR

## 2010-02-13 LAB — CONVERTED CEMR LAB: Pap Smear: NEGATIVE

## 2010-02-20 ENCOUNTER — Ambulatory Visit: Payer: Self-pay | Admitting: Internal Medicine

## 2010-02-21 LAB — CONVERTED CEMR LAB
Bilirubin Urine: NEGATIVE
Ketones, ur: NEGATIVE mg/dL
Leukocytes, UA: NEGATIVE
Nitrite: NEGATIVE
Specific Gravity, Urine: 1.02 (ref 1.000–1.030)
Total Protein, Urine: NEGATIVE mg/dL
Urine Glucose: NEGATIVE mg/dL
Urobilinogen, UA: 0.2 (ref 0.0–1.0)
pH: 5.5 (ref 5.0–8.0)

## 2010-09-21 ENCOUNTER — Ambulatory Visit: Payer: Self-pay | Admitting: Internal Medicine

## 2010-10-02 ENCOUNTER — Encounter: Admission: RE | Admit: 2010-10-02 | Discharge: 2010-10-02 | Payer: Self-pay | Admitting: Internal Medicine

## 2010-10-02 LAB — HM MAMMOGRAPHY

## 2010-10-03 IMAGING — MG MM DIGITAL SCREENING
5 series · 5 of 5 positions shown · non-contrast
Comparison: none

DG SCREEN MAMMOGRAM BILATERAL
Bilateral CC and MLO view(s) were taken.

DIGITAL SCREENING MAMMOGRAM WITH CAD:
There are scattered fibroglandular densities.  No masses or malignant type calcifications are 
identified.  Compared with prior studies.
Images were processed with CAD.

[R CC (1 of 2)]
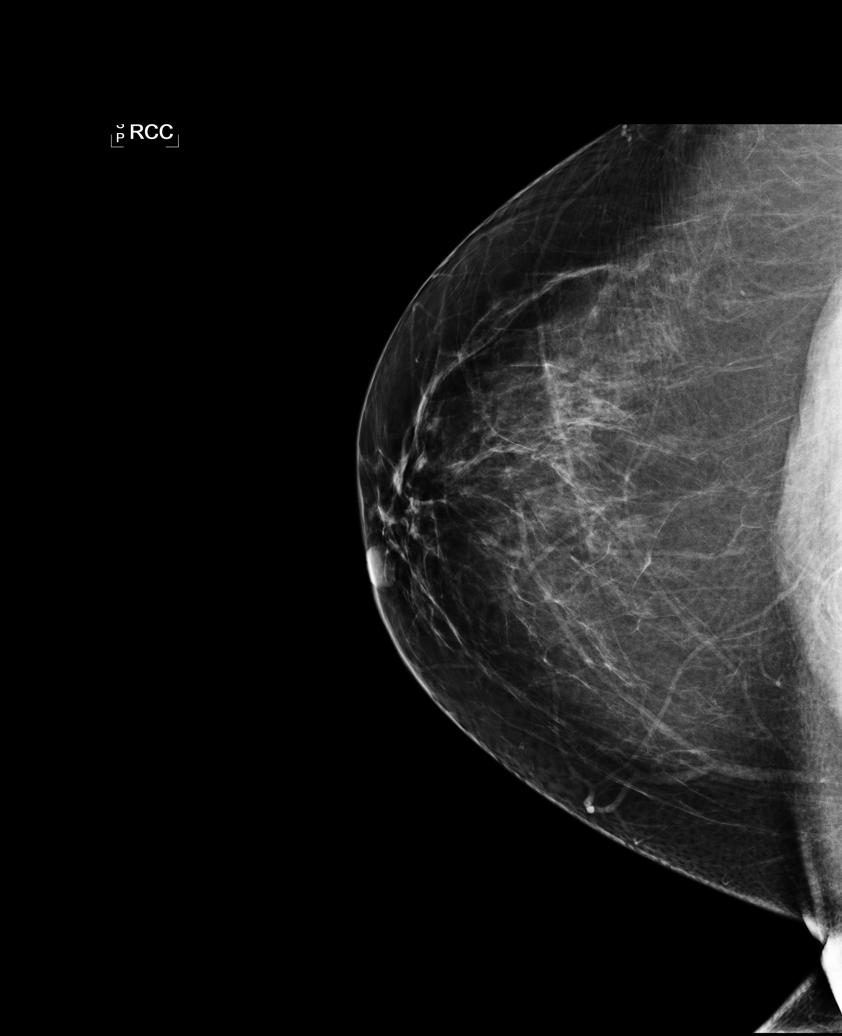

[L CC]
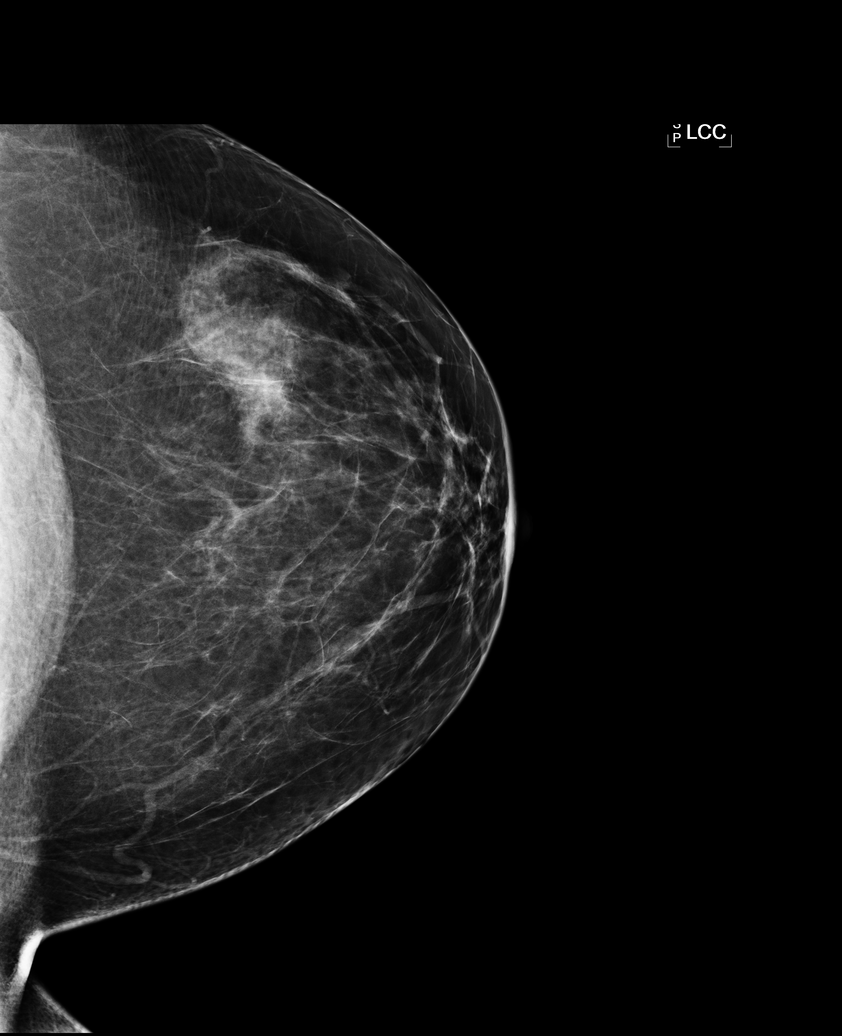

[L MLO]
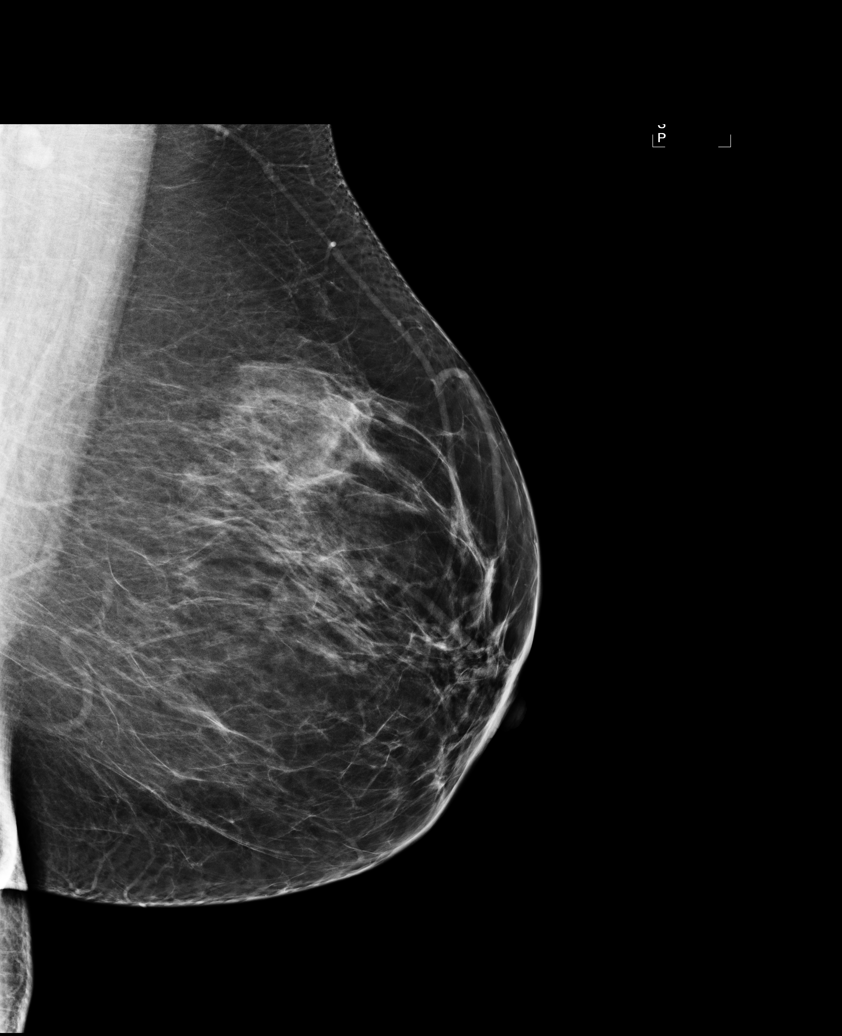

[R MLO]
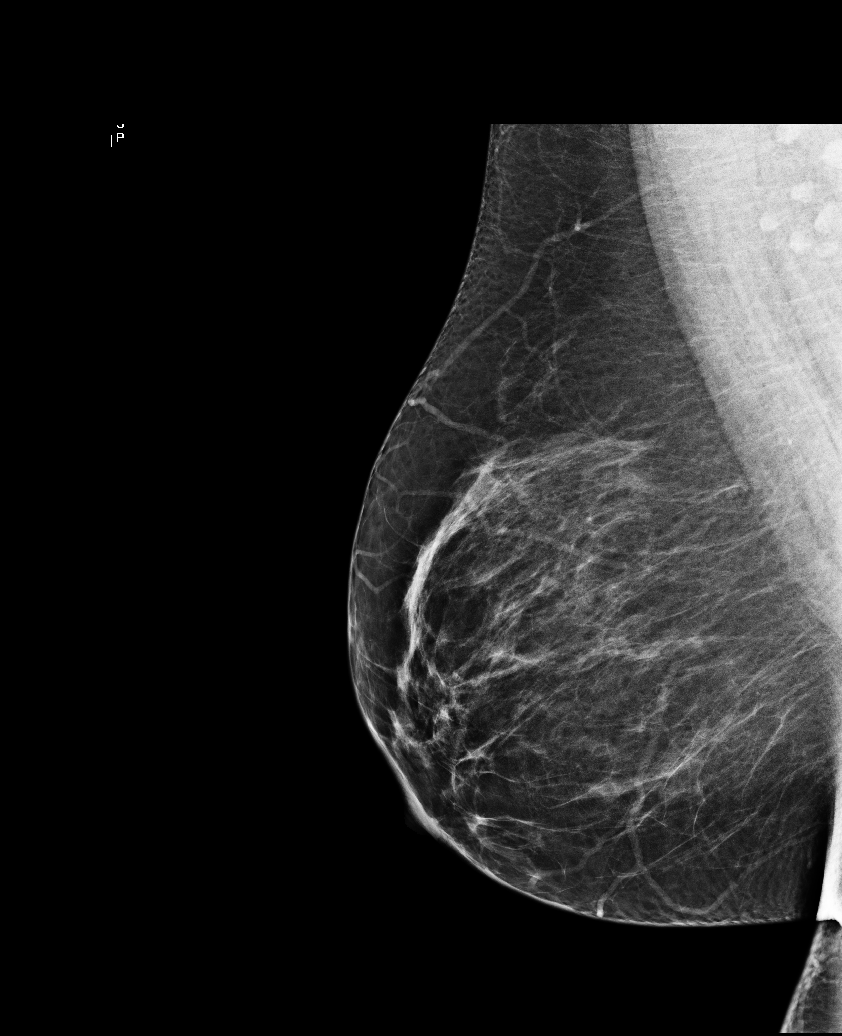

[R CC (2 of 2)]
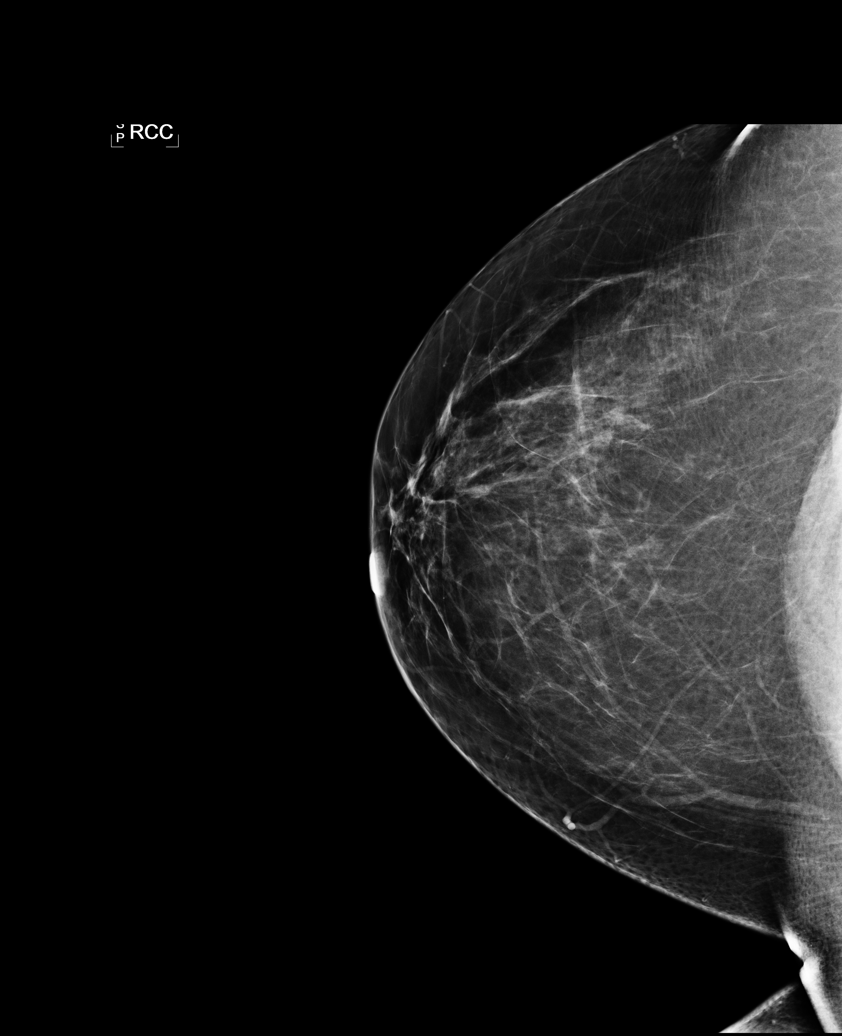

[5 of 5 positions shown; findings below may reference images not displayed]

IMPRESSION: No specific mammographic evidence of malignancy.  Next screening mammogram is recommended in one 
year.

A result letter of this screening mammogram will be mailed directly to the patient.

ASSESSMENT: Negative - BI-RADS 1

Screening mammogram in 1 year.
,

## 2011-01-28 LAB — CONVERTED CEMR LAB
Cholesterol, target level: 200 mg/dL
HDL goal, serum: 40 mg/dL
LDL Goal: 160 mg/dL

## 2011-01-30 NOTE — Assessment & Plan Note (Signed)
Summary: cpx/pap/cjr   Vital Signs:  Patient profile:   46 year old female Menstrual status:  irregular LMP:     01/14/2010 Height:      67 inches Weight:      233 pounds Pulse rate:   72 / minute Pulse rhythm:   regular BP sitting:   100 / 70  (right arm) Cuff size:   regular  Vitals Entered By: Romualdo Bolk, CMA (AAMA) (February 07, 2010 9:16 AM) CC: cpx with pap LMP (date): 01/14/2010 LMP - Character: heavy    Menses interval (days): 20-32 Menstrual flow (days): 5 Enter LMP: 01/14/2010 Last PAP Result NEGATIVE FOR INTRAEPITHELIAL LESIONS OR MALIGNANCY.   History of Present Illness: Victoria Carter comesin today for   fr preventive visit . Since last visit  here  there have been no major changes in health status  . She know she needs to lose weight.   she aslo has medical problems being followed. LIPIDS: nose of medications PMS mood signs   helps   was cycling the prozac but now is taking every day  .  no se of meds.  Takes diclofenac for her cramps.  needs refill     Preventive Care Screening  Prior Values:    Pap Smear:  NEGATIVE FOR INTRAEPITHELIAL LESIONS OR MALIGNANCY. (12/22/2008)    Mammogram:  ASSESSMENT: Negative - BI-RADS 1^MM DIGITAL SCREENING (09/26/2009)    Last Tetanus Booster:  Historical (05/31/2002)   Preventive Screening-Counseling & Management  Alcohol-Tobacco     Alcohol drinks/day: <1     Alcohol type: beer     Smoking Status: quit     Year Quit: 2003  Caffeine-Diet-Exercise     Caffeine use/day: 0     Does Patient Exercise: no  Hep-HIV-STD-Contraception     Dental Visit-last 6 months yes     Sun Exposure-Excessive: no  Safety-Violence-Falls     Seat Belt Use: yes     Firearms in the Home: no firearms in the home     Smoke Detectors: yes  EKG  Procedure date:  12/30/2007  Findings:      Normal sinus rhythm with rate of:  66  Current Medications (verified): 1)  Fluoxetine Hcl 10 Mg  Caps (Fluoxetine Hcl) .Marland Kitchen.. 1 By  Mouth Two Times A Day or As Directed. 2)  Diclofenac Potassium 50 Mg Tabs (Diclofenac Potassium) .Marland Kitchen.. 1 By Mouth Every 4 To 6 Hours As Needed Pain 3)  Lipitor 40 Mg  Tabs (Atorvastatin Calcium) .Marland Kitchen.. 1 By Mouth Once Daily  Allergies (verified): No Known Drug Allergies  Past History:  Past medical, surgical, family and social histories (including risk factors) reviewed, and no changes noted (except as noted below).  Past Medical History: Reviewed history from 12/22/2008 and no changes required. G2P2 Hx of abnormal pap  used cryo when first married and pregnant.  Sinusitis High cholesterol Allergic rhinitis Depression UTIs hyperglycemia        LAST LMP: 12/07/08 Husband with vascectomy Mammogram: 09/20/08 Pap: 12/29/08 Td: 05/2002 Colonscopy: n/a EKG: 12/19/08 Dexa: n/a Eye Exam: 2009 Smoking: former  Consults: Dr. Cain Saupe Dr Elmon Else  Past Surgical History: CB x2 Breast Biopsy   1987  Past History:  Care Management: Dermatology: Emily Filbert ENT: Christella Hartigan  Family History: Reviewed history from 12/22/2008 and no changes required. Family History Diabetes 1st degree relative Family History High cholesterol Brother had MI age 49.   Family History of CAD Female 1st degree relative <50 Father: None Mother:  None  Siblings:  Brother MI at age 43    Social History: Reviewed history from 12/22/2008 and no changes required. 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    Seat Belt Use:  yes Dental Care w/in 6 mos.:  yes Sun Exposure-Excessive:  no  Review of Systems  The patient denies anorexia, fever, weight loss, vision loss, decreased hearing, hoarseness, chest pain, syncope, dyspnea on exertion, peripheral edema, prolonged cough, headaches, hemoptysis, abdominal pain, melena, hematochezia, severe indigestion/heartburn, hematuria, incontinence, muscle weakness, difficulty walking,  unusual weight change, angioedema, and breast masses.         PMS signs   Physical Exam General Appearance: well developed, well nourished, no acute distress Eyes: conjunctiva and lids normal, PERRLA, EOMI,  WNL Ears, Nose, Mouth, Throat: TM clear, nares clear, oral exam WNL Neck: supple, no lymphadenopathy, no thyromegaly, no JVD Respiratory: clear to auscultation and percussion, respiratory effort normal Cardiovascular: regular rate and rhythm, S1-S2, no murmur, rub or gallop, no bruits, peripheral pulses normal and symmetric, no cyanosis, clubbing, edema or varicosities Chest: no scars, masses, tenderness; no asymmetry, skin changes, nipple discharge   Gastrointestinal: soft, non-tender; no hepatosplenomegaly, masses; active bowel sounds all quadrants, guaiac negative stool; no masses, tenderness, hemorrhoids  Genitourinary: no vaginal discharge, lesions; no masses or tenderness  pap done   Lymphatic: no cervical, axillary or inguinal adenopathy Musculoskeletal: gait normal, muscle tone and strength WNL, no joint swelling, effusions, discoloration, crepitus  Skin: clear, good turgor, color WNL, no rashes, lesions, or ulcerations Neurologic: normal mental status, normal reflexes, normal strength, sensation, and motion Psychiatric: alert; oriented to person, place and time Other Exam:   see labs     Impression & Recommendations:  Problem # 1:  PREVENTIVE HEALTH CARE (ICD-V70.0) Discussed nutrition,exercise,diet,healthy weight, vitamin D and calcium.   Problem # 2:  ROUTINE GYNECOLOGICAL EXAMINATION (ICD-V72.31)  pap done  mammo UTD  Orders: Pap Smear, Thin Prep ( Collection of) (Z5638)  Problem # 3:  HYPERLIPIDEMIA (ICD-272.2)  Her updated medication list for this problem includes:    Lipitor 40 Mg Tabs (Atorvastatin calcium) .Marland Kitchen... 1 by mouth once daily  Labs Reviewed: SGOT: 24 (01/30/2010)   SGPT: 27 (01/30/2010)  Lipid Goals: Chol Goal: 200 (12/30/2007)   HDL Goal: 40  (12/30/2007)   LDL Goal: 160 (12/30/2007)   TG Goal: 150 (12/30/2007)  Prior 10 Yr Risk Heart Disease: 3 % (12/30/2007)   HDL:49.10 (01/30/2010), 36.1 (12/15/2008)  LDL:81 (01/30/2010), 75 (12/15/2008)  Chol:147 (01/30/2010), 124 (12/15/2008)  Trig:83.0 (01/30/2010), 65 (12/15/2008)  Problem # 4:  HYPERGLYCEMIA, FASTING (ICD-790.29) continues    counseled about her risk of getting  Dm . lose weight and exercise.   has lost 7 # since last ov.  Problem # 5:  DYSMENORRHEA (ICD-625.3) continue meds as needed   Problem # 6:  DEPRESSION (ICD-311)   pms  aggravations  Assessment: Unchanged seems stable       continue meds for now   Problem # 7:  URINALYSIS, ABNORMAL (ICD-791.9) Assessment: New pos dx for blood     repeat today   but done after pelvic exam.   no symptoms . Orders: UA Dipstick w/o Micro (automated)  (81003)  Complete Medication List: 1)  Fluoxetine Hcl 10 Mg Caps (Fluoxetine hcl) .Marland Kitchen.. 1 by mouth two times a day or as directed. 2)  Diclofenac Potassium 50 Mg Tabs (Diclofenac potassium) .Marland Kitchen.. 1 by mouth every 4 to  6 hours as needed pain 3)  Lipitor 40 Mg Tabs (Atorvastatin calcium) .Marland Kitchen.. 1 by mouth once daily  Patient Instructions: 1)  You will be informed of lab results when available.  2)  Continue weight loss with life style intervention   decrease calories increase exercise.  to prevent diabetes and other health problems. 3)  cpx in a year  earlier if needed.     Prescriptions: DICLOFENAC POTASSIUM 50 MG TABS (DICLOFENAC POTASSIUM) 1 by mouth every 4 to 6 hours as needed pain  #30 x 6   Entered and Authorized by:   Madelin Headings MD   Signed by:   Madelin Headings MD on 02/07/2010   Method used:   Electronically to        [facility] [phone]* (retail)       [address]       [place], [place]  [address]       Ph: [phone] or [phone]       Fax: [phone]   RxID:   [id number] LIPITOR 40 MG  TABS (ATORVASTATIN CALCIUM) 1 by mouth once daily  #30 Tablet x  12   Entered and Authorized by:   Madelin Headings MD   Signed by:   Madelin Headings MD on 02/07/2010   Method used:   [facility]ronically to        [facility] [phone]* (retail)       [address]       [place], [place]  [address]       Ph: [phone] or [phone]       Fax: [phone]   RxID:   [id number]   Prevention & Chronic Care Immunizations   Influenza vaccine: Fluvax 3+  (10/01/2008)    Tetanus booster: 05/31/2002: Historical    Pneumococcal vaccine: Not documented  Other Screening   Pap smear: NEGATIVE FOR INTRAEPITHELIAL LESIONS OR MALIGNANCY.  (12/22/2008)    Mammogram: ASSESSMENT: Negative - BI-RADS 1^MM DIGITAL SCREENING  (09/26/2009)   Smoking status: quit  (02/07/2010)  Lipids   Total Cholesterol: 147  (01/30/2010)   LDL: 81  (01/30/2010)   LDL Direct: Not documented   HDL: 49.10  (01/30/2010)   Triglycerides: 83.0  (01/30/2010)    SGOT (AST): 24  (01/30/2010)   SGPT (ALT): 27  (01/30/2010)   Alkaline phosphatase: 53  (01/30/2010)   Total bilirubin: 0.3  (01/30/2010)  Self-Management Support :    Lipid self-management support: Not documented    Laboratory Results   Urine Tests    Routine Urinalysis   Color: yellow Glucose: negative   (Normal Range: Negative) Bilirubin: negative   (Normal Range: Negative) Ketone: negative   (Normal Range: Negative) Spec. Gravity: >=1.030   (Normal Range: 1.003-1.035) Blood: moderate   (Normal Range: Negative) pH: 5.0   (Normal Range: 5.0-8.0) Protein: negative   (Normal Range: Negative) Urobilinogen: 0.2   (Normal Range: 0-1) Nitrite: negative   (Normal Range: Negative) Leukocyte Esterace: negative   (Normal Range: Negative)    Comments: Romualdo Bolk, CMA (AAMA)  February 07, 2010 10:38 AM

## 2011-01-30 NOTE — Assessment & Plan Note (Signed)
Summary: cpx w/pap/ccm/PT RESCD FROM [facility] PER [facility]/CCM   Vital Signs:  Patient Profile:   46 Years Old Female Height:     66.75 inches Weight:      235 pounds Pulse rate:   78 / minute BP sitting:   100 / 60  (left arm) Cuff size:   regular  Vitals Entered By: Romualdo Bolk, CMA (December 30, 2007 10:11 AM)                 Chief Complaint:  Preventive Care.  History of Present Illness: Victoria Carter is here for CPX with pap. LMP: 12/14/07. Pt is starting to have irregulair periods. Pt needs rx on all meds. Losing weight and feeling better. 15 pounds since November. Doesn't drink milk or take vit d supp Sleep is good.    No interim hx.   Periods  irreg 20-40 days lasting max 5-6 days sometimes heavy.     Uses cataflam as needed cramps No CP sob.  Walking. Plans to exercise more. Fluoxetine once daily and two times a day premenstrually helps a lot. No side effect of lipitor  has calf stiffness in am sometimes.   Lipid Management History:      Positive NCEP/ATP III risk factors include HDL cholesterol less than 40 and family history for ischemic heart disease (males less than 39 years old).  Negative NCEP/ATP III risk factors include female age less than 77 years old, no history of early menopause without estrogen hormone replacement, non-diabetic, non-tobacco-user status, non-hypertensive, no ASHD (atherosclerotic heart disease), no prior stroke/TIA, no peripheral vascular disease, and no history of aortic aneurysm.        The patient states that she knows about the "Therapeutic Lifestyle Change" diet.  Adjunctive measures started by the patient include weight reduction.  The patient denies any symptoms to suggest myopathy or liver disease from her "statin" therapy.     Current Allergies (reviewed today): No known allergies   Past Medical History:    Sinusitis    High cholesterol    Allergic rhinitis    Depression    UTIs    hyperglycemia   Family  History:    Family History Diabetes 1st degree relative    Family History High cholesterol    Brother had MI age 84.      Family History of CAD Female 1st degree relative <50  Social History:    Occupation:   40per week minimum    Married    Former Smoker    Alcohol use-yes    Drug use-no    Regular exercise-yes   Risk Factors:     Type:  beer    Drinks per day:  <1    Comments:  socially Exercise:  no  Family History Risk Factors:    Family History of MI in males < 52 years old:  yes  Mammogram History:     Date of Last Mammogram:  08/01/2007   PAP Smear History:     Date of Last PAP Smear:  07/30/2006    Review of Systems  The patient denies anorexia, fever, vision loss, chest pain, syncope, dyspnea on exhertion, peripheral edema, prolonged cough, and abdominal pain.         no numbness or weakness.   Physical Exam  General:     alert, well-developed, well-nourished, and well-hydrated.   Head:     normocephalic and atraumatic.   Eyes:     vision grossly intact, pupils equal,  pupils round, and pupils reactive to light.   Ears:     R ear normal and L ear normal.   Nose:     no nasal discharge.   Mouth:     pharynx pink and moist.   Neck:     No deformities, masses, or tenderness noted. Breasts:     No mass, nodules, thickening, tenderness, bulging, retraction, inflamation, nipple discharge or skin changes noted.   Lungs:     Normal respiratory effort, chest expands symmetrically. Lungs are clear to auscultation, no crackles or wheezes. Heart:     Normal rate and regular rhythm. S1 and S2 normal without gallop, murmur, click, rub or other extra sounds. Abdomen:     Bowel sounds positive,abdomen soft and non-tender without masses, organomegaly or hernias noted. Rectal:     No external abnormalities noted. Normal sphincter tone. No rectal masses or tenderness. Genitalia:     Pelvic Exam:        External: normal female genitalia without lesions or masses         Vagina: normal without lesions .        Cervix: hard to visualize sems normal         Adnexa: normal bimanual exam without masses         Uterus: normal by palpation exam limited by abd wall        Pap smear: performed Msk:     no acute  changes or effusions Pulses:     R and L carotid,radial,femoral,dorsalis pedis and posterior tibial pulses are full and equal bilaterally Extremities:     no cce Neurologic:     grossly normal  Skin:     turgor normal and color normal.   Cervical Nodes:     No lymphadenopathy noted Axillary Nodes:     No palpable lymphadenopathy Inguinal Nodes:     No significant adenopathy Psych:     normally interactive, good eye contact, not anxious appearing, and not depressed appearing.   Additional Exam:     EKG nsr    Impression & Recommendations:  Problem # 1:  PREVENTIVE HEALTH CARE (ICD-V70.0) counseled  continue mammograms   diet low in vit d and will checlk vit d at next lab. Orders: EKG w/ Interpretation (93000)   Problem # 2:  ROUTINE GYNECOLOGICAL EXAMINATION (ICD-V72.31) PAP done today    Problem # 3:  HYPERLIPIDEMIA (ICD-272.2) Assessment: Deteriorated low hdl   intensify statin med and needs to lose weight and exercise     to reach goal   The following medications were removed from the medication list:    Lipitor 20 Mg Tabs (Atorvastatin calcium) .Marland Kitchen... 1 by mouth once daily  Her updated medication list for this problem includes:    Lipitor 40 Mg Tabs (Atorvastatin calcium) .Marland Kitchen... 1 by mouth once daily  Orders: EKG w/ Interpretation (93000)   Problem # 4:  DEPRESSION (ICD-311) with pms Assessment: Unchanged continue meds The following medications were removed from the medication list:    Prozac 20 Mg Caps (Fluoxetine hcl)  Her updated medication list for this problem includes:    Fluoxetine Hcl 10 Mg Caps (Fluoxetine hcl) .Marland Kitchen... 1 by mouth two times a day or as directed.   Problem # 5:  HYPERGLYCEMIA, FASTING  (ICD-790.29) Assessment: Unchanged counseled about continued weight loss and sugar avoidance to prevent diabetes. Orders: EKG w/ Interpretation (93000)   Problem # 6:  FAMILY HISTORY OF CAD FEMALE 1ST DEGREE RELATIVE <50 (ICD-V17.3) Assessment:  Comment Only  Problem # 7:  DYSMENORRHEA (ICD-625.3) Assessment: Unchanged refill nsaid and use as needed.  Complete Medication List: 1)  Fluoxetine Hcl 10 Mg Caps (Fluoxetine hcl) .Marland Kitchen.. 1 by mouth two times a day or as directed. 2)  Diclofenac Potassium 50 Mg Tabs (Diclofenac potassium) .Marland Kitchen.. 1 by mouth every 4 to 6 hours as needed pain 3)  Lipitor 40 Mg Tabs (Atorvastatin calcium) .Marland Kitchen.. 1 by mouth once daily  Lipid Assessment/Plan:      Based on NCEP/ATP III, the patient's risk factor category is "2 or more risk factors and a calculated 10 year CAD risk of < 20%".  From this information, the patient's calculated lipid goals are as follows: Total cholesterol goal is 200; LDL cholesterol goal is 130; HDL cholesterol goal is 40; Triglyceride goal is 150.     Patient Instructions: 1)  Lipid Panel prior to visit, ICD-9: 2)  HbgA1C prior to visit, ICD-9: 3)  Sgot SGPT  Vitamin d level 4)  Please schedule a follow-up appointment in 3 months. 5)  continue weight  loss begin exercise.    Prescriptions: FLUOXETINE HCL 10 MG  CAPS (FLUOXETINE HCL) 1 by mouth two times a day or as directed.  #60 x 12   Entered and Authorized by:   Madelin Headings MD   Signed by:   Madelin Headings MD on 12/30/2007   Method used:   [facility] to ...       [id number]       [address]       [place], [place]  [address]       Ph: [phone] or [phone]       Fax: [phone]   RxID:   [id number] DICLOFENAC POTASSIUM 50 MG TABS (DICLOFENAC POTASSIUM) 1 by mouth every 4 to 6 hours as needed pain  #30 x 6   Entered and Authorized by:   Madelin Headings MD   Signed by:   Madelin Headings MD on 12/30/2007   Method used:   Electronically sent to  ...       [facility] [id number]       [address]       [place], [place]  [address]       Ph: [phone] or [phone]       Fax: [phone]   RxID:   [id number] LIPITOR 40 MG  TABS (ATORVASTATIN CALCIUM) 1 by mouth once daily  #30 x 6   Entered and Authorized by:   Madelin Headings MD   Signed by:   Madelin Headings MD on 12/30/2007   Method used:   Electronically sent to ...       [facility] [id number]       [address]       [place], [place]  [address]       Ph: [phone] or [phone]       Fax: [phone]   RxID:   [id number]  ]

## 2011-01-30 NOTE — Assessment & Plan Note (Signed)
Summary: cpx/pap/jls pt rsc pt is aware last cpx not yr to date/njr   Vital Signs:  Patient Profile:   46 Years Old Female LMP:     12/07/2008 Height:     66.75 inches Weight:      237 pounds BMI:     37.53 Pulse rate:   78 / minute BP sitting:   100 / 70  (left arm) Cuff size:   regular  Vitals Entered By: Romualdo Bolk, CMA (December 22, 2008 11:04 AM)  Menstrual History: LMP (date): 12/07/2008 LMP - Character: heavy Menses interval: 20-32 days Menstrual flow: 5 days                 Chief Complaint:  Preventive Care.  History of Present Illness: Victoria Carter is here for a cpx with pap.  Current Problems:  HYPERGLYCEMIA, FASTING (ICD-790.29) HYPERLIPIDEMIA (ICD-272.2)  No se of meds  PREVENTIVE HEALTH CARE (ICD-V70.0) exercising now every day   and 1 month of changing diet.   Does mood eating. working on this.   takes mvi    ROUTINE GYNECOLOGICAL EXAMINATION (ICD-V72.31) FAMILY HISTORY OF CAD FEMALE 1ST DEGREE RELATIVE <50 (ICD-V17.3) FAMILY HISTORY DIABETES 1ST DEGREE RELATIVE (ICD-V18.0) DEPRESSION (ICD-311)  Doing well  on medication ALLERGIC RHINITIS (ICD-477.9)   Recently treated for cellulitis of uvula   better on augmentin.   saw Dr Christella Hartigan for this.       Prior Medications Reviewed Using: Patient Recall  Prior Medication List:  FLUOXETINE HCL 10 MG  CAPS (FLUOXETINE HCL) 1 by mouth two times a day or as directed. DICLOFENAC POTASSIUM 50 MG TABS (DICLOFENAC POTASSIUM) 1 by mouth every 4 to 6 hours as needed pain LIPITOR 40 MG  TABS (ATORVASTATIN CALCIUM) 1 by mouth once daily   Current Allergies (reviewed today): No known allergies   Past Medical History:    G2P2    Hx of abnormal pap  used cryo when first married and pregnant.     Sinusitis    High cholesterol    Allergic rhinitis    Depression    UTIs    hyperglycemia              LAST    LMP: 12/07/08 Husband with vascectomy    Mammogram: 09/20/08    Pap: 12/29/08    Td: 05/2002  Colonscopy: n/a    EKG: 12/19/08    Dexa: n/a    Eye Exam: 2009    Smoking: former        Consults:    Dr. Cain Saupe    Dr Elmon Else      Past Surgical History:    CB x2    Breast Biopsy     Family History:    Family History Diabetes 1st degree relative    Family History High cholesterol    Brother had MI age 46.      Family History of CAD Female 1st degree relative <50    Father: None    Mother:  None    Siblings:  Brother MI at age 85  Social History:    Occupation:   40per week minimum    Married    Former Smoker    Alcohol use-yes    Drug use-no    Regular exercise-yes        Risk Factors: Tobacco use:  quit    Year quit:  2003 Drug use:  no Alcohol use:  yes    Type:  beer  Drinks per day:  <1 Exercise:  no  Family History Risk Factors:    Family History of MI in males < 33 years old:  yes  Mammogram History:    Date of Last Mammogram:  08/01/2007  PAP Smear History:    Date of Last PAP Smear:  07/30/2006   Review of Systems  The patient denies anorexia, fever, hemoptysis, abdominal pain, melena, hematochezia, severe indigestion/heartburn, hematuria, incontinence, genital sores, muscle weakness, suspicious skin lesions, transient blindness, difficulty walking, depression, unusual weight change, abnormal bleeding, enlarged lymph nodes, angioedema, and breast masses.         bening   skin lesion   Physical Exam  General:     Well-developed,well-nourished,in no acute distress; alert,appropriate and cooperative throughout examination Head:     normocephalic and atraumatic.   Eyes:     vision grossly intact, pupils equal, and pupils round.   Ears:     R ear normal and L ear normal.   Nose:     External nasal examination shows no deformity or inflammation. Nasal mucosa are pink and moist without lesions or exudates. Neck:     No deformities, masses, or tenderness noted. Chest Wall:     No deformities, masses, or tenderness  noted. Breasts:     No mass, nodules, thickening, tenderness, bulging, retraction, inflamation, nipple discharge or skin changes noted.   Lungs:     Normal respiratory effort, chest expands symmetrically. Lungs are clear to auscultation, no crackles or wheezes. Heart:     Normal rate and regular rhythm. S1 and S2 normal without gallop, murmur, click, rub or other extra sounds. Abdomen:     Bowel sounds positive,abdomen soft and non-tender without masses, organomegaly or hernias noted. Rectal:     No external abnormalities noted. Normal sphincter tone. No rectal masses or tenderness. Genitalia:     Pelvic Exam:        External: normal female genitalia without lesions or masses        Vagina: normal without lesions or masses        Cervix: normal without lesions or masses posterior        Adnexa: normal bimanual exam without masses or fullness        Uterus: normal by palpation        Pap smear: performed Msk:     normal ROM, no joint swelling, no joint warmth, and no redness over joints.   Pulses:     R and L carotid,radial,femoral,dorsalis pedis and posterior tibial pulses are full and equal bilaterally Extremities:     no cce  Neurologic:     alert & oriented X3 and gait normal.  non focal Skin:     turgor normal, color normal, no ecchymoses, and no petechiae.   Cervical Nodes:     No lymphadenopathy noted Axillary Nodes:     No palpable lymphadenopathy Inguinal Nodes:     No significant adenopathy Psych:     Oriented X3, good eye contact, and not anxious appearing.   Additional Exam:     see Labs     Impression & Recommendations:  Problem # 1:  PREVENTIVE HEALTH CARE (ICD-V70.0) Discussed nutrition,exercise,diet,healthy weight, vitamin D and calcium.    Problem # 2:  ROUTINE GYNECOLOGICAL EXAMINATION (ICD-V72.31)  Orders: Obtaining Screening PAP Smear (Y6948)   Problem # 3:  DYSMENORRHEA (ICD-625.3) Assessment: Comment Only  Problem # 4:  HYPERGLYCEMIA,  FASTING (ICD-790.29)  Labs Reviewed: Creat: 0.9 (12/15/2008)  Problem # 5:  HYPERLIPIDEMIA (ICD-272.2) no se of meds Her updated medication list for this problem includes:    Lipitor 40 Mg Tabs (Atorvastatin calcium) .Marland Kitchen... 1 by mouth once daily  Labs Reviewed: Chol: 124 (12/15/2008)   HDL: 36.1 (12/15/2008)   LDL: 75 (12/15/2008)   TG: 65 (12/15/2008) SGOT: 27 (12/15/2008)   SGPT: 32 (12/15/2008)  Lipid Goals: Chol Goal: 200 (12/30/2007)   HDL Goal: 40 (12/30/2007)   LDL Goal: 160 (12/30/2007)   TG Goal: 150 (12/30/2007)  Prior 10 Yr Risk Heart Disease: 3 % (12/30/2007)   Problem # 6:  FAMILY HISTORY DIABETES 1ST DEGREE RELATIVE (ICD-V18.0) Assessment: Comment Only  Problem # 7:  DEPRESSION (ICD-311) stable  Her updated medication list for this problem includes:    Fluoxetine Hcl 10 Mg Caps (Fluoxetine hcl) .Marland Kitchen... 1 by mouth two times a day or as directed.   Complete Medication List: 1)  Fluoxetine Hcl 10 Mg Caps (Fluoxetine hcl) .Marland Kitchen.. 1 by mouth two times a day or as directed. 2)  Diclofenac Potassium 50 Mg Tabs (Diclofenac potassium) .Marland Kitchen.. 1 by mouth every 4 to 6 hours as needed pain 3)  Lipitor 40 Mg Tabs (Atorvastatin calcium) .Marland Kitchen.. 1 by mouth once daily   Patient Instructions: 1)  goal is 800 to 1000 international units of vit d per day.  2)  Decrease sugar and simple carbohydrates to avoid diabetes . Continue to exercise and lose weight.    Prescriptions: DICLOFENAC POTASSIUM 50 MG TABS (DICLOFENAC POTASSIUM) 1 by mouth every 4 to 6 hours as needed pain  #30 x 6   Entered and Authorized by:   Madelin Headings MD   Signed by:   Madelin Headings MD on 12/22/2008   Method used:   Electronically to        [facility] [phone][facility] (retail)       [address]       [place], [place]  [address]       Ph: [phone] or [phone]       Fax: [phone]   RxID:   [id number]  ]

## 2011-01-30 NOTE — Assessment & Plan Note (Signed)
Summary: sore throat//ccm   Vital Signs:  Patient profile:   46 year old female Menstrual status:  irregular LMP:     09/20/2009 Weight:      240 pounds BMI:     38.01 Temp:     98.1 degrees F oral Pulse rate:   66 / minute BP sitting:   100 / 70  (right arm) Cuff size:   regular  Vitals Entered By: Romualdo Bolk, CMA (AAMA) (September 22, 2009 9:44 AM) CC: Sore throat and uvlua is swollen. This started on 09/21/09. Pt states that this happened back in 11/2008 and was [facility] nystatin and a antibiotic. LMP (date): 09/20/2009 LMP - Character: heavy    Menses interval (days): 20-32 Menstrual flow (days): 5 Menstrual Status irregular Enter LMP: 09/20/2009 Last PAP Result NEGATIVE FOR INTRAEPITHELIAL LESIONS OR MALIGNANCY.   History of Present Illness: Victoria Carter comes  in today for   for above   awoke with swollen uvual again and uncomfortable but not as bad as in the past when she had cellulitis of uvula and necrosis findings . required antibiotic and MMW.    Worried that it could recutr.  No ulcers or cold sores . no fever now. Has had mild ur congestion drainage but no pain otherwise. No sore throat otherwise.   No exposures.  No NVD  Preventive Screening-Counseling & Management  Alcohol-Tobacco     Alcohol drinks/day: <1     Alcohol type: beer     Smoking Status: quit     Year Quit: 2003  Caffeine-Diet-Exercise     Caffeine use/day: 0     Does Patient Exercise: no  Current Medications (verified): 1)  Fluoxetine Hcl 10 Mg  Caps (Fluoxetine Hcl) .Marland Kitchen.. 1 By Mouth Two Times A Day or As Directed. 2)  Diclofenac Potassium 50 Mg Tabs (Diclofenac Potassium) .Marland Kitchen.. 1 By Mouth Every 4 To 6 Hours As Needed Pain 3)  Lipitor 40 Mg  Tabs (Atorvastatin Calcium) .Marland Kitchen.. 1 By Mouth Once Daily  Allergies (verified): No Known Drug Allergies  Past History:  Past medical, surgical, family and social histories (including risk factors) reviewed, and no changes noted (except as noted  below).  Past Medical History: Reviewed history from 12/22/2008 and no changes required. G2P2 Hx of abnormal pap  used cryo when first married and pregnant.  Sinusitis High cholesterol Allergic rhinitis Depression UTIs hyperglycemia        LAST LMP: 12/07/08 Husband with vascectomy Mammogram: 09/20/08 Pap: 12/29/08 Td: 05/2002 Colonscopy: n/a EKG: 12/19/08 Dexa: n/a Eye Exam: 2009 Smoking: former  Consults: Dr. Cain Saupe Dr Elmon Else  Past Surgical History: Reviewed history from 12/22/2008 and no changes required. CB x2 Breast Biopsy    Past History:  Care Management: Dermatology: Emily Filbert ENT: Christella Hartigan  Family History: Reviewed history from 12/22/2008 and no changes required. Family History Diabetes 1st degree relative Family History High cholesterol Brother had MI age 55.   Family History of CAD Female 1st degree relative <50 Father: None Mother:  None Siblings:  Brother MI at age 11  Social History: Reviewed history from 12/22/2008 and no changes required. Occupation:   40per week minimum Married Former Smoker Alcohol use-yes Drug use-no Regular exercise-yes  Caffeine use/day:  0  Review of Systems  The patient denies anorexia, fever, weight loss, weight gain, chest pain, prolonged cough, abdominal pain, difficulty walking, unusual weight change, abnormal bleeding, enlarged lymph nodes, syncope, dyspnea on exertion, peripheral edema, and hemoptysis.    Physical Exam  General:  Well-developed,well-nourished,in no acute distress; alert,appropriate and cooperative throughout examination Head:  normocephalic, atraumatic, and no abnormalities observed.   Eyes:  vision grossly intact, pupils equal, and pupils round.   Ears:  R ear normal, L ear normal, and no external deformities.   Nose:  no external deformity, no external erythema, and no nasal discharge.  minimal congestion and no face pain Mouth:  good dentition.  uvula 1+ edema with redness and  noulcer or  renanthem otherwise . i see nodrainage.   airway looks good.  Neck:  shoddy nodes  supple  Lungs:  normal respiratory effort, no intercostal retractions, and no accessory muscle use.   Heart:  normal rate and regular rhythm.   Skin:  turgor normal, color normal, no petechiae, and no purpura.   Cervical Nodes:  shoddy ac no pc nodes  Psych:  Oriented X3, good eye contact, not anxious appearing, and not depressed appearing.     Impression & Recommendations:  Problem # 1:  CELLULITIS  OF ORAL SOFT TISSUES UVULITIS (ICD-528.3) this appears to be mild today but early and by hx had a sever case  in the past . disc options  and can treat empirically  for this at present.  call if persistent and progressive   or alarm symptoms . Her updated medication list for this problem includes:    Keflex 500 Mg Caps (Cephalexin) .Marland Kitchen... 1 by mouth three times a day or as directed  Complete Medication List: 1)  Fluoxetine Hcl 10 Mg Caps (Fluoxetine hcl) .Marland Kitchen.. 1 by mouth two times a day or as directed. 2)  Diclofenac Potassium 50 Mg Tabs (Diclofenac potassium) .Marland Kitchen.. 1 by mouth every 4 to 6 hours as needed pain 3)  Lipitor 40 Mg Tabs (Atorvastatin calcium) .Marland Kitchen.. 1 by mouth once daily 4)  Keflex 500 Mg Caps (Cephalexin) .Marland Kitchen.. 1 by mouth three times a day or as directed  Patient Instructions: 1)  magic mouth wash  gargles  have pharmacy call for refill if needed. 2)  Ibuprofen if needed 3)  andtibiotic as directed .   4)  if recurring call    consider another ENT check.  Prescriptions: KEFLEX 500 MG CAPS (CEPHALEXIN) 1 by mouth three times a day or as directed  #30 x 0   Entered and Authorized by:   Madelin Headings MD   Signed by:   Madelin Headings MD on 09/22/2009   Method used:   [facility] to        [facility] [phone][facility] (retail)       [address]       [place], [place]  [address]       Ph: [phone] or [phone]       Fax: [phone]   RxID:   [id number]

## 2011-01-30 NOTE — Assessment & Plan Note (Signed)
Summary: flu shot/jls  Nurse Visit  Flu Vaccine Consent Questions     Do you have a history of severe allergic reactions to this vaccine? no    Any prior history of allergic reactions to egg and/or gelatin? no    Do you have a sensitivity to the preservative Thimersol? no    Do you have a past history of Guillan-Barre Syndrome? no    Do you currently have an acute febrile illness? no    Have you ever had a severe reaction to latex? no    Vaccine information Levert and explained to patient? yes    Are you currently pregnant? no    Lot Number:[id number]   Site Spink  Left Deltoid IM   Prior Medications: FLUOXETINE HCL 10 MG  CAPS (FLUOXETINE HCL) 1 by mouth two times a day or as directed. DICLOFENAC POTASSIUM 50 MG TABS (DICLOFENAC POTASSIUM) 1 by mouth every 4 to 6 hours as needed pain LIPITOR 40 MG  TABS (ATORVASTATIN CALCIUM) 1 by mouth once daily Current Allergies: No known allergies     Orders Added: 1)  Admin 1st Vaccine [90471] 2)  Flu Vaccine 51yrs + Baden.Dew    ]

## 2011-01-30 NOTE — Progress Notes (Signed)
Summary: refill  Phone Note Call from Patient Call back at Home Phone [phone]   Caller: patient triage message Call For: panosh Summary of Call: has appt for 12 30 for physical  her fluoxetine HCL and Lipitor will run out before the appt  please call in an interim amount of meds Initial call taken by: Roselle Locus,  December 02, 2007 9:45 AM  Follow-up for Phone Call        already sent thru emr. Follow-up by: Romualdo Bolk, CMA,  December 02, 2007 10:03 AM

## 2011-01-30 NOTE — Assessment & Plan Note (Signed)
Summary: flu shot/cjr  Nurse Visit   Allergies: No Known Drug Allergies  Orders Added: 1)  Admin 1st Vaccine [90471] 2)  Flu Vaccine 60yrs + [09811] Flu Vaccine Consent Questions     Do you have a history of severe allergic reactions to this vaccine? no    Any prior history of allergic reactions to egg and/or gelatin? no    Do you have a sensitivity to the preservative Thimersol? no    Do you have a past history of Guillan-Barre Syndrome? no    Do you currently have an acute febrile illness? no    Have you ever had a severe reaction to latex? no    Vaccine information Schone and explained to patient? yes    Are you currently pregnant? no    Lot Number:[id number]   Exp Date:06/30/2011   Site Adolph  Left Deltoid IM 2)  Flu Vaccine 64yrs + [91478] .lbflu

## 2011-01-30 NOTE — Assessment & Plan Note (Signed)
Summary: flu shot/njr  Nurse Visit    Prior Medications: FLUOXETINE HCL 10 MG  CAPS (FLUOXETINE HCL) 1 by mouth two times a day or as directed. Pt needs to schedule a follow up before next refill DICLOFENAC POTASSIUM 50 MG TABS (DICLOFENAC POTASSIUM)  LIPITOR 20 MG TABS (ATORVASTATIN CALCIUM)  PROZAC 20 MG  CAPS (FLUOXETINE HCL)  CATAFLAM 50 MG  TABS (DICLOFENAC POTASSIUM)  Current Allergies: No known allergies    Influenza Vaccine    Vaccine Type: Fluvax Non-MCR    [place] by: Romualdo Bolk, CMA  Flu Vaccine Consent Questions    Do you have a history of severe allergic reactions to this vaccine? no    Any prior history of allergic reactions to egg and/or gelatin? no    Do you have a sensitivity to the preservative Thimersol? no    Do you have a past history of Guillan-Barre Syndrome? no    Do you currently have an acute febrile illness? no    Have you ever had a severe reaction to latex? no    Vaccine information Sinning and explained to patient? yes    Are you currently pregnant? no   Orders Added: 1)  Influenza Vaccine NON MCR [00028]   Impression & Recommendations:  lot [id number], EXP 30 jun 09, sanofi pasteur left deltoid IM, 0.5 cc. ..................................................................Marland KitchenRomualdo Bolk, CMA  October 30, 2007 3:23 PM   Complete Medication List: 1)  Fluoxetine Hcl 10 Mg Caps (Fluoxetine hcl) .Marland Kitchen.. 1 by mouth two times a day or as directed. pt needs to schedule a follow up before next refill 2)  Diclofenac Potassium 50 Mg Tabs (Diclofenac potassium) 3)  Lipitor 20 Mg Tabs (Atorvastatin calcium) 4)  Prozac 20 Mg Caps (Fluoxetine hcl) 5)  Cataflam 50 Mg Tabs (Diclofenac potassium)    ]

## 2011-02-28 ENCOUNTER — Telehealth: Payer: Self-pay | Admitting: *Deleted

## 2011-02-28 MED ORDER — ATORVASTATIN CALCIUM 80 MG PO TABS: 40.0000 mg | ORAL_TABLET | Freq: Every day | ORAL | Status: DC

## 2011-02-28 NOTE — Telephone Encounter (Signed)
Ok to do this but   Realize  No guarantee that it is less expensive . The manufacturer says don't do this but it is a common practice

## 2011-02-28 NOTE — Telephone Encounter (Signed)
Pt aware of this. She is going to see how much the cost is.

## 2011-02-28 NOTE — Telephone Encounter (Signed)
Pt receives generic lipitor 40mg . She wants a rx wrote for 80mg  and take 1/2 tab daily due to cost. Can we do this?

## 2011-03-04 ENCOUNTER — Other Ambulatory Visit: Payer: Self-pay | Admitting: Internal Medicine

## 2011-03-29 ENCOUNTER — Other Ambulatory Visit: Payer: Self-pay | Admitting: Internal Medicine

## 2011-05-21 ENCOUNTER — Other Ambulatory Visit (INDEPENDENT_AMBULATORY_CARE_PROVIDER_SITE_OTHER): Payer: Managed Care, Other (non HMO) | Admitting: Internal Medicine

## 2011-05-21 DIAGNOSIS — Z0000 Encounter for general adult medical examination without abnormal findings: Secondary | ICD-10-CM

## 2011-05-21 LAB — TSH: TSH: 4.68 u[IU]/mL (ref 0.35–5.50)

## 2011-05-21 LAB — BASIC METABOLIC PANEL
BUN: 15 mg/dL (ref 6–23)
CO2: 25 mEq/L (ref 19–32)
Calcium: 8.7 mg/dL (ref 8.4–10.5)
Chloride: 101 mEq/L (ref 96–112)
Creatinine, Ser: 0.9 mg/dL (ref 0.4–1.2)
GFR: 75.13 mL/min (ref >60.00)
Glucose, Bld: 99 mg/dL (ref 70–99)
Potassium: 4.5 mEq/L (ref 3.5–5.1)
Sodium: 135 mEq/L (ref 135–145)

## 2011-05-21 LAB — LIPID PANEL
Cholesterol: 139 mg/dL (ref 0–200)
HDL: 42 mg/dL (ref >39.00)
LDL Cholesterol: 80 mg/dL (ref 0–99)
Total CHOL/HDL Ratio: 3
Triglycerides: 86 mg/dL (ref 0.0–149.0)
VLDL: 17.2 mg/dL (ref 0.0–40.0)

## 2011-05-21 LAB — POCT URINALYSIS DIPSTICK
Bilirubin, UA: NEGATIVE
Blood, UA: NEGATIVE
Glucose, UA: NEGATIVE
Ketones, UA: NEGATIVE
Leukocytes, UA: NEGATIVE
Nitrite, UA: NEGATIVE
Protein, UA: NEGATIVE
Spec Grav, UA: 1.015
Urobilinogen, UA: 0.2
pH, UA: 5

## 2011-05-21 LAB — CBC WITH DIFFERENTIAL/PLATELET
Basophils Absolute: 0 10*3/uL (ref 0.0–0.1)
Basophils Relative: 0.4 % (ref 0.0–3.0)
Eosinophils Absolute: 0.2 10*3/uL (ref 0.0–0.7)
Eosinophils Relative: 3 % (ref 0.0–5.0)
HCT: 36.3 % (ref 36.0–46.0)
Hemoglobin: 12.4 g/dL (ref 12.0–15.0)
Lymphocytes Relative: 26.2 % (ref 12.0–46.0)
Lymphs Abs: 1.6 10*3/uL (ref 0.7–4.0)
MCHC: 34.1 g/dL (ref 30.0–36.0)
MCV: 88.3 fl (ref 78.0–100.0)
Monocytes Absolute: 0.5 10*3/uL (ref 0.1–1.0)
Monocytes Relative: 8.5 % (ref 3.0–12.0)
Neutro Abs: 3.8 10*3/uL (ref 1.4–7.7)
Neutrophils Relative %: 61.9 % (ref 43.0–77.0)
Platelets: 242 10*3/uL (ref 150.0–400.0)
RBC: 4.11 Mil/uL (ref 3.87–5.11)
RDW: 14.3 % (ref 11.5–14.6)
WBC: 6.2 10*3/uL (ref 4.5–10.5)

## 2011-05-21 LAB — HEPATIC FUNCTION PANEL
ALT: 26 U/L (ref 0–35)
AST: 23 U/L (ref 0–37)
Albumin: 3.7 g/dL (ref 3.5–5.2)
Alkaline Phosphatase: 66 U/L (ref 39–117)
Bilirubin, Direct: 0.1 mg/dL (ref 0.0–0.3)
Total Bilirubin: 0.3 mg/dL (ref 0.3–1.2)
Total Protein: 6.1 g/dL (ref 6.0–8.3)

## 2011-06-01 ENCOUNTER — Encounter: Payer: Self-pay | Admitting: Internal Medicine

## 2011-06-01 DIAGNOSIS — Z8742 Personal history of other diseases of the female genital tract: Secondary | ICD-10-CM | POA: Insufficient documentation

## 2011-06-05 ENCOUNTER — Ambulatory Visit (INDEPENDENT_AMBULATORY_CARE_PROVIDER_SITE_OTHER): Payer: Managed Care, Other (non HMO) | Admitting: Internal Medicine

## 2011-06-05 ENCOUNTER — Encounter: Payer: Self-pay | Admitting: Internal Medicine

## 2011-06-05 VITALS — BP 110/70 | HR 66 | Ht 66.75 in | Wt 250.0 lb

## 2011-06-05 DIAGNOSIS — E669 Obesity, unspecified: Secondary | ICD-10-CM

## 2011-06-05 DIAGNOSIS — F329 Major depressive disorder, single episode, unspecified: Secondary | ICD-10-CM

## 2011-06-05 DIAGNOSIS — Z0000 Encounter for general adult medical examination without abnormal findings: Secondary | ICD-10-CM | POA: Insufficient documentation

## 2011-06-05 DIAGNOSIS — E782 Mixed hyperlipidemia: Secondary | ICD-10-CM

## 2011-06-05 DIAGNOSIS — Z8249 Family history of ischemic heart disease and other diseases of the circulatory system: Secondary | ICD-10-CM

## 2011-06-05 DIAGNOSIS — R7309 Other abnormal glucose: Secondary | ICD-10-CM

## 2011-06-05 NOTE — Patient Instructions (Signed)
Ok to try 10 mg prozac  2 weeks of months and  20 mg rest of month.   lifestyle intervention healthy eating and exercise . As we discussed Pap and check up in a year.

## 2011-06-05 NOTE — Progress Notes (Signed)
  Subjective:    Patient ID: Victoria Carter, female    DOB: 03/09/1965, 46 y.o.   MRN: [id number]  HPI Patient comes in today for preventive visit. Also followup of medicine management. Since her last visit she has done generally well and no major changes in her health status. She has been struggling with her weight. She has lost some weight and now has put it back on and she describes herself as a mood eater.   No chest pain shortness of breath sleep apnea symptoms. No major injuries. She is on fluoxetine 20 mg a day. Is doing well with this denies a side affect Lipids:  No side effects of medication. Review of Systems Negative for hearing vision breathing new joint problems skin changes. Bleeding. She has a remote history of an abnormal Pap when she got married otherwise all her Paps have been normal since then and her last one was a year ago.    Objective:   Physical Exam Physical Exam: Vital signs reviewed BJY:NWGN is a well-developed well-nourished alert cooperative  white female who appears her stated age in no acute distress.  HEENT: normocephalic  traumatic , Eyes: PERRL EOM's full, conjunctiva clear, Nares: paten,t no deformity discharge or tenderness., Ears: no deformity EAC's clear TMs with normal landmarks. Mouth: clear OP, no lesions, edema.  Moist mucous membranes. Dentition in adequate repair. NECK: supple without masses, thyromegaly or bruits. Breast: normal by inspection . No dimpling, discharge, masses, tenderness or discharge . LN: no cervical axillary inguinal adenopathy. CHEST/PULM:  Clear to auscultation and percussion breath sounds equal no wheeze , rales or rhonchi. No chest wall deformities or tenderness. CV: PMI is nondisplaced, S1 S2 no gallops, murmurs, rubs. Peripheral pulses are full without delay.No JVD .  ABDOMEN: Bowel sounds normal nontender  No guard or rebound, no hepato splenomegal no CVA tenderness.  No hernia. Extremtities:  No clubbing cyanosis or edema,  no acute joint swelling or redness no focal atrophy NEURO:  Oriented x3, cranial nerves 3-12 appear to be intact, no obvious focal weakness,gait within normal limits no abnormal reflexes or asymmetrical SKIN: No acute rashes normal turgor, color, no bruising or petechiae. PSYCH: Oriented, good eye contact, no obvious depression anxiety, cognition and judgment appear normal.    Labs reviewed  EKG nsr  Assessment & Plan:  Preventive Health Care Up to date Counseled regarding healthy nutrition, exercise, sleep, injury prevention, calcium vit d and healthy weight . Can get pap next year LIPIDS  No se of meds    OBesity.  Problematic    Counseled.  Mood:    Stable  Consider decreasing and using for pms.  Only but ok to remain on meds  As doing well.   Hyperglycemia better

## 2011-06-06 ENCOUNTER — Encounter: Payer: Self-pay | Admitting: Internal Medicine

## 2011-06-06 DIAGNOSIS — Z8249 Family history of ischemic heart disease and other diseases of the circulatory system: Secondary | ICD-10-CM | POA: Insufficient documentation

## 2011-06-06 DIAGNOSIS — E669 Obesity, unspecified: Secondary | ICD-10-CM | POA: Insufficient documentation

## 2011-09-01 LAB — HM MAMMOGRAPHY: HM Mammogram: NORMAL

## 2011-09-28 ENCOUNTER — Other Ambulatory Visit: Payer: Self-pay | Admitting: Internal Medicine

## 2011-09-28 DIAGNOSIS — Z1231 Encounter for screening mammogram for malignant neoplasm of breast: Secondary | ICD-10-CM

## 2011-10-08 ENCOUNTER — Ambulatory Visit
Admission: RE | Admit: 2011-10-08 | Discharge: 2011-10-08 | Disposition: A | Discharge status: Home / Self Care | Payer: Managed Care, Other (non HMO) | Source: Ambulatory Visit | Attending: Internal Medicine | Admitting: Internal Medicine

## 2011-10-08 DIAGNOSIS — Z1231 Encounter for screening mammogram for malignant neoplasm of breast: Secondary | ICD-10-CM

## 2011-10-09 IMAGING — MG MM DIGITAL SCREENING {BCG}
4 series · 4 of 4 positions shown · non-contrast
Comparison: Prior studies.

DG SCREEN MAMMOGRAM BILATERAL
Bilateral CC and MLO view(s) were taken.

DIGITAL SCREENING MAMMOGRAM WITH CAD:

[R CC]
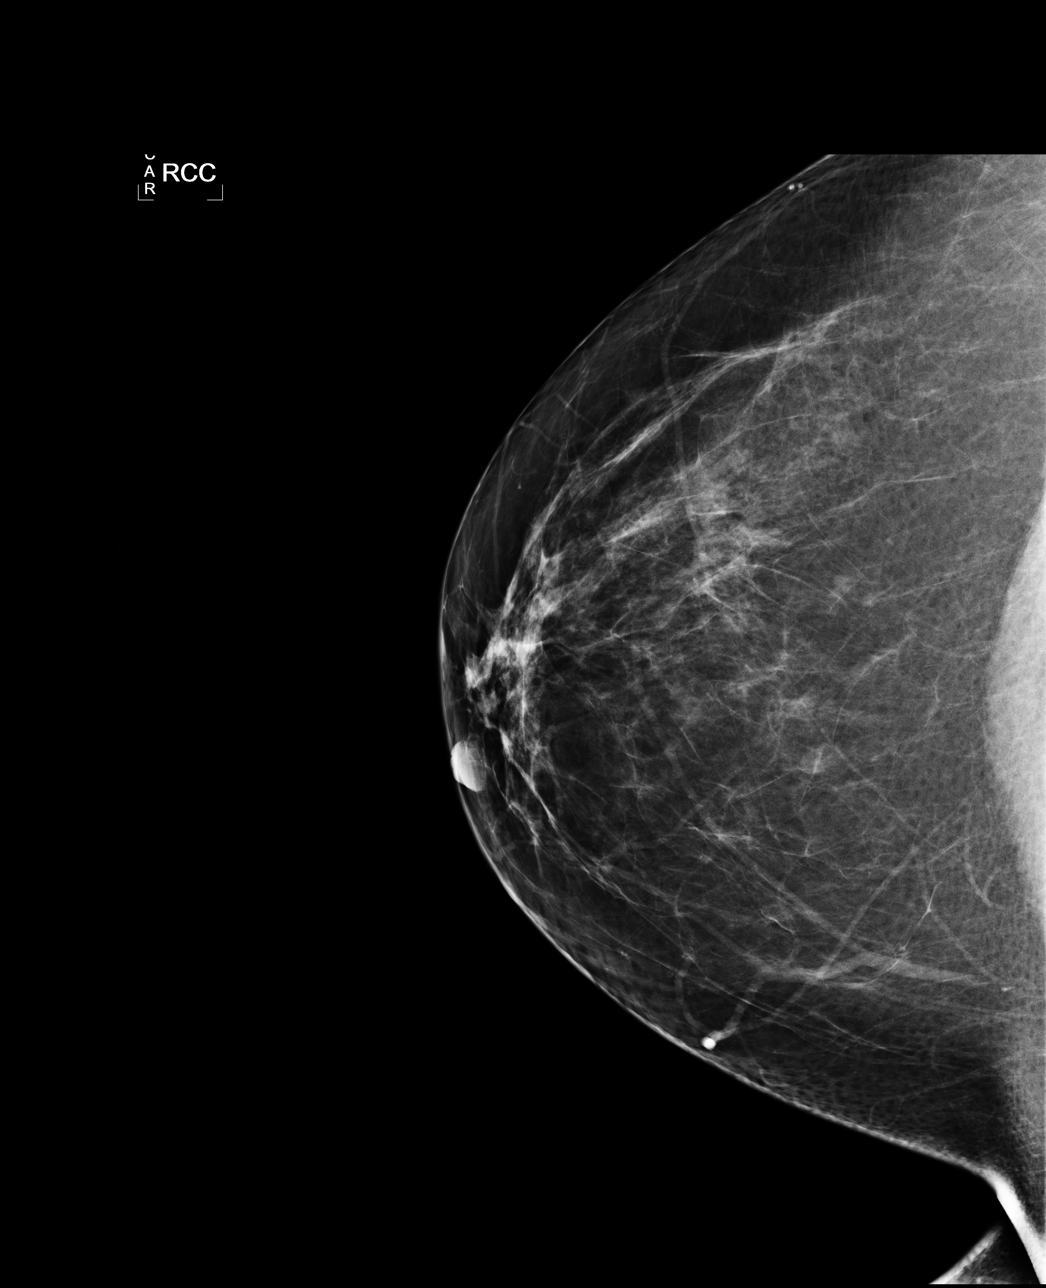

[L CC]
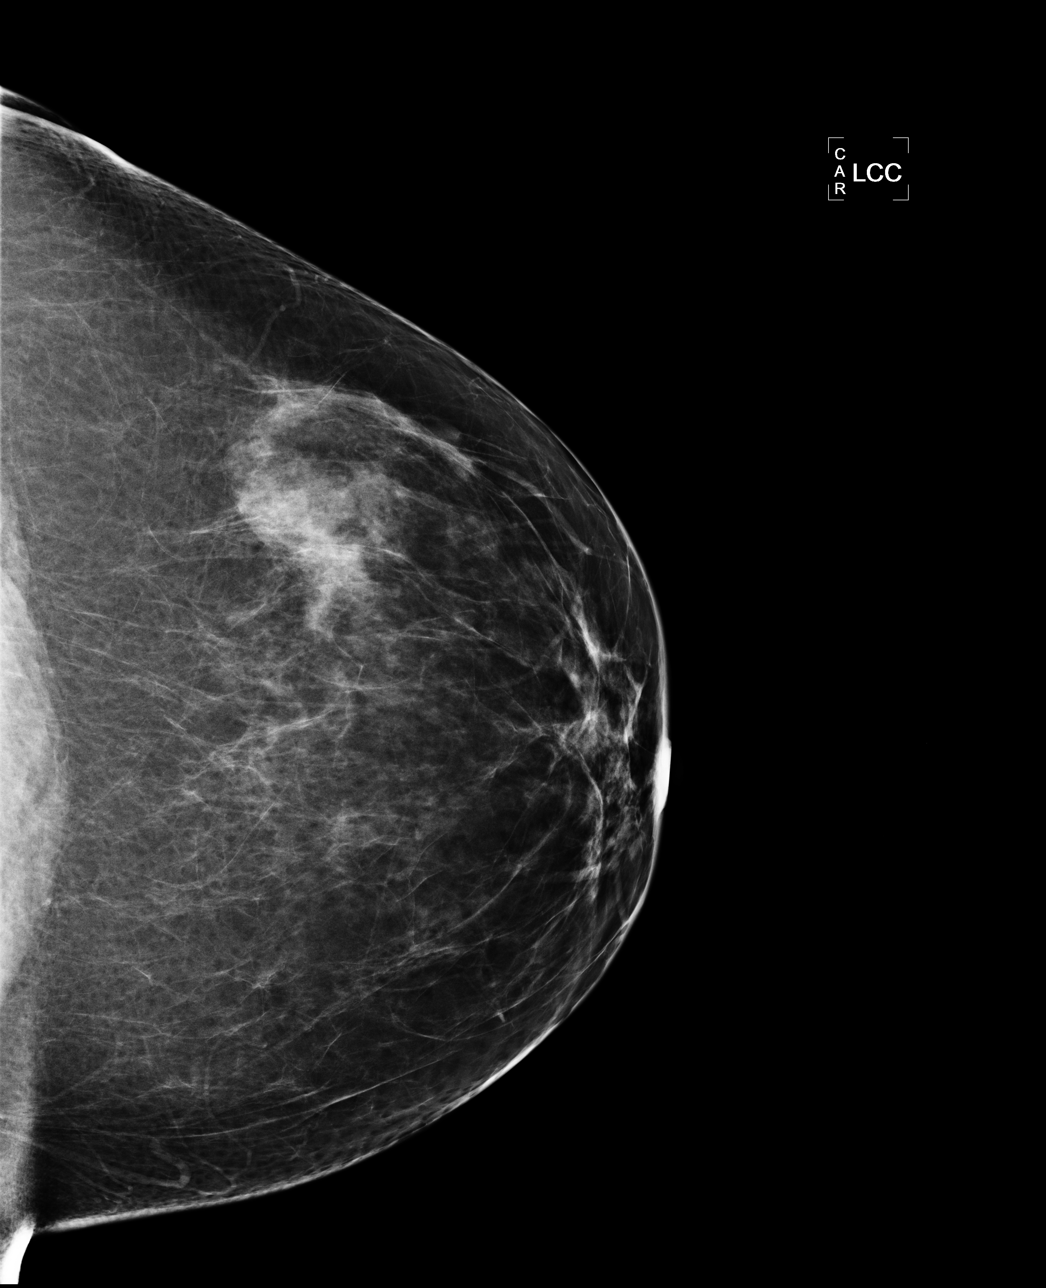

[L MLO]
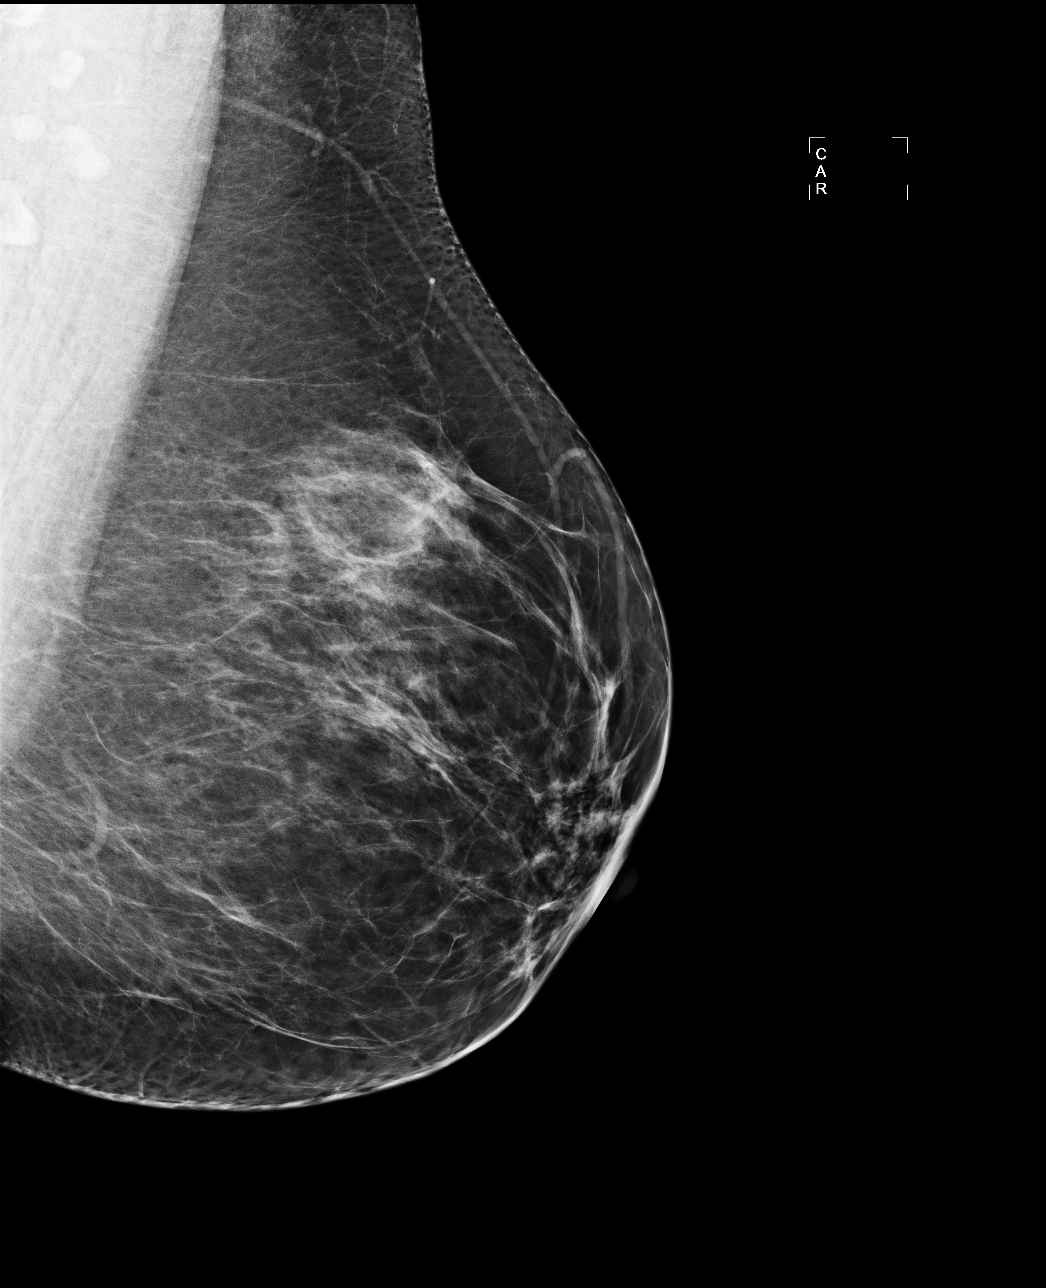

[R MLO]
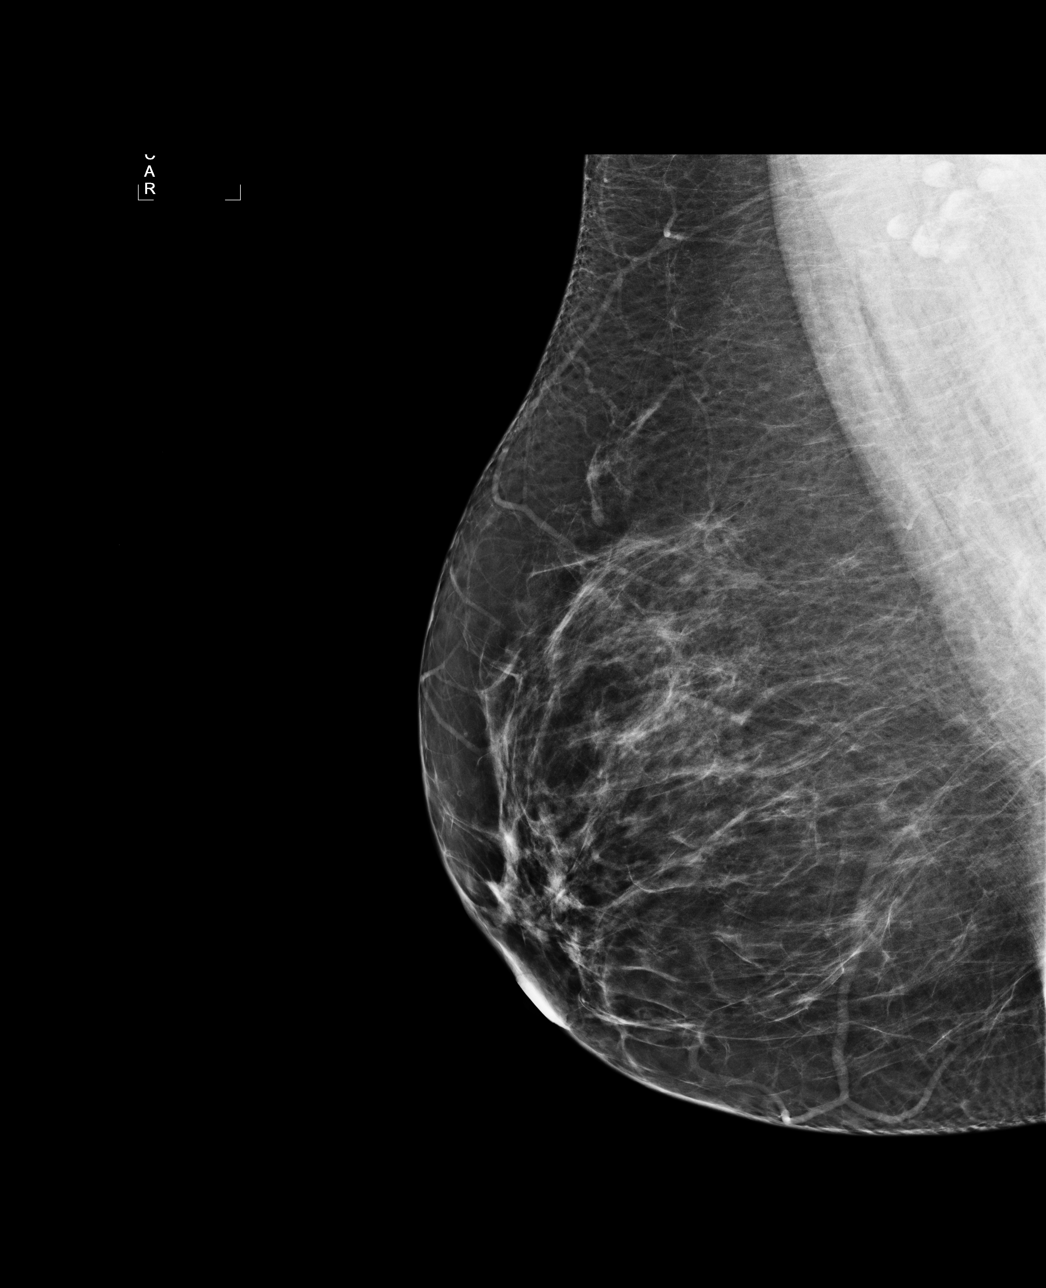

[4 of 4 positions shown; findings below may reference images not displayed]

There are scattered fibroglandular densities.  There is no dominant mass, architectural distortion 
or calcification to suggest malignancy.

Images were processed with CAD.
IMPRESSION: No mammographic evidence of malignancy.  Suggest yearly screening mammography.

A result letter of this screening mammogram will be mailed directly to the patient.

ASSESSMENT: Negative - BI-RADS 1

Screening mammogram in 1 year.
,

## 2011-10-11 ENCOUNTER — Ambulatory Visit (INDEPENDENT_AMBULATORY_CARE_PROVIDER_SITE_OTHER): Payer: Managed Care, Other (non HMO)

## 2011-10-11 DIAGNOSIS — Z23 Encounter for immunization: Secondary | ICD-10-CM

## 2011-11-07 ENCOUNTER — Other Ambulatory Visit: Payer: Self-pay | Admitting: Internal Medicine

## 2011-11-07 DIAGNOSIS — N946 Dysmenorrhea, unspecified: Secondary | ICD-10-CM | POA: Insufficient documentation

## 2011-11-07 NOTE — Telephone Encounter (Signed)
Ok x 1  Please get dx in eht about  Diagnosis

## 2011-11-07 NOTE — Telephone Encounter (Signed)
Spoke to pt- she takes this for menstrual cramps. Rx sent to pharmacy.

## 2012-02-15 ENCOUNTER — Other Ambulatory Visit: Payer: Self-pay | Admitting: Internal Medicine

## 2012-05-21 ENCOUNTER — Other Ambulatory Visit: Payer: Self-pay

## 2012-05-21 NOTE — Telephone Encounter (Signed)
Rx request for fluoxetine hcl 10 mg #90. Pt last seen 06/05/11.  Pt has an upcoming cpx on 06/11/12.  Pls advise.

## 2012-05-22 NOTE — Telephone Encounter (Signed)
Refill x 90 so she doesn't run out.

## 2012-05-23 MED ORDER — FLUOXETINE HCL 10 MG PO CAPS: 10.0000 mg | ORAL_CAPSULE | Freq: Every day | ORAL | Status: DC

## 2012-05-23 NOTE — Telephone Encounter (Signed)
Rx sent to pharmacy   

## 2012-06-04 ENCOUNTER — Other Ambulatory Visit (INDEPENDENT_AMBULATORY_CARE_PROVIDER_SITE_OTHER): Payer: Managed Care, Other (non HMO)

## 2012-06-04 DIAGNOSIS — Z0000 Encounter for general adult medical examination without abnormal findings: Secondary | ICD-10-CM

## 2012-06-04 LAB — HEPATIC FUNCTION PANEL
ALT: 19 U/L (ref 0–35)
AST: 20 U/L (ref 0–37)
Albumin: 4.2 g/dL (ref 3.5–5.2)
Alkaline Phosphatase: 76 U/L (ref 39–117)
Bilirubin, Direct: 0 mg/dL (ref 0.0–0.3)
Total Bilirubin: 0.5 mg/dL (ref 0.3–1.2)
Total Protein: 6.8 g/dL (ref 6.0–8.3)

## 2012-06-04 LAB — LIPID PANEL
Cholesterol: 141 mg/dL (ref 0–200)
HDL: 44.3 mg/dL (ref >39.00)
LDL Cholesterol: 76 mg/dL (ref 0–99)
Total CHOL/HDL Ratio: 3
Triglycerides: 106 mg/dL (ref 0.0–149.0)
VLDL: 21.2 mg/dL (ref 0.0–40.0)

## 2012-06-04 LAB — POCT URINALYSIS DIPSTICK
Bilirubin, UA: NEGATIVE
Glucose, UA: NEGATIVE
Ketones, UA: NEGATIVE
Leukocytes, UA: NEGATIVE
Nitrite, UA: NEGATIVE
Protein, UA: NEGATIVE
Spec Grav, UA: <=1.005
Urobilinogen, UA: 0.2
pH, UA: 5.5

## 2012-06-04 LAB — TSH: TSH: 4.13 u[IU]/mL (ref 0.35–5.50)

## 2012-06-04 LAB — CBC WITH DIFFERENTIAL/PLATELET
Basophils Absolute: 0 10*3/uL (ref 0.0–0.1)
Basophils Relative: 0.5 % (ref 0.0–3.0)
Eosinophils Absolute: 0.2 10*3/uL (ref 0.0–0.7)
Eosinophils Relative: 3.6 % (ref 0.0–5.0)
HCT: 36.5 % (ref 36.0–46.0)
Hemoglobin: 11.9 g/dL — ABNORMAL LOW (ref 12.0–15.0)
Lymphocytes Relative: 26.9 % (ref 12.0–46.0)
Lymphs Abs: 1.3 10*3/uL (ref 0.7–4.0)
MCHC: 32.8 g/dL (ref 30.0–36.0)
MCV: 88.3 fl (ref 78.0–100.0)
Monocytes Absolute: 0.3 10*3/uL (ref 0.1–1.0)
Monocytes Relative: 7.1 % (ref 3.0–12.0)
Neutro Abs: 3 10*3/uL (ref 1.4–7.7)
Neutrophils Relative %: 61.9 % (ref 43.0–77.0)
Platelets: 229 10*3/uL (ref 150.0–400.0)
RBC: 4.13 Mil/uL (ref 3.87–5.11)
RDW: 13.7 % (ref 11.5–14.6)
WBC: 4.8 10*3/uL (ref 4.5–10.5)

## 2012-06-04 LAB — BASIC METABOLIC PANEL
BUN: 20 mg/dL (ref 6–23)
CO2: 26 mEq/L (ref 19–32)
Calcium: 9 mg/dL (ref 8.4–10.5)
Chloride: 107 mEq/L (ref 96–112)
Creatinine, Ser: 1 mg/dL (ref 0.4–1.2)
GFR: 60.74 mL/min (ref >60.00)
Glucose, Bld: 106 mg/dL — ABNORMAL HIGH (ref 70–99)
Potassium: 4.2 mEq/L (ref 3.5–5.1)
Sodium: 142 mEq/L (ref 135–145)

## 2012-06-11 ENCOUNTER — Ambulatory Visit (INDEPENDENT_AMBULATORY_CARE_PROVIDER_SITE_OTHER): Payer: Managed Care, Other (non HMO) | Admitting: Internal Medicine

## 2012-06-11 ENCOUNTER — Other Ambulatory Visit (HOSPITAL_COMMUNITY)
Admission: RE | Admit: 2012-06-11 | Discharge: 2012-06-11 | Disposition: A | Discharge status: Home / Self Care | Payer: Managed Care, Other (non HMO) | Source: Ambulatory Visit | Attending: Internal Medicine | Admitting: Internal Medicine

## 2012-06-11 ENCOUNTER — Encounter: Payer: Self-pay | Admitting: Internal Medicine

## 2012-06-11 VITALS — BP 106/72 | HR 96 | Temp 97.8°F | Ht 66.75 in | Wt 258.0 lb

## 2012-06-11 DIAGNOSIS — F329 Major depressive disorder, single episode, unspecified: Secondary | ICD-10-CM

## 2012-06-11 DIAGNOSIS — Z1159 Encounter for screening for other viral diseases: Secondary | ICD-10-CM | POA: Insufficient documentation

## 2012-06-11 DIAGNOSIS — N951 Menopausal and female climacteric states: Secondary | ICD-10-CM

## 2012-06-11 DIAGNOSIS — Z23 Encounter for immunization: Secondary | ICD-10-CM

## 2012-06-11 DIAGNOSIS — R7309 Other abnormal glucose: Secondary | ICD-10-CM

## 2012-06-11 DIAGNOSIS — Z01419 Encounter for gynecological examination (general) (routine) without abnormal findings: Secondary | ICD-10-CM

## 2012-06-11 DIAGNOSIS — Z0000 Encounter for general adult medical examination without abnormal findings: Secondary | ICD-10-CM

## 2012-06-11 DIAGNOSIS — E669 Obesity, unspecified: Secondary | ICD-10-CM

## 2012-06-11 DIAGNOSIS — Z8742 Personal history of other diseases of the female genital tract: Secondary | ICD-10-CM

## 2012-06-11 DIAGNOSIS — E782 Mixed hyperlipidemia: Secondary | ICD-10-CM

## 2012-06-11 DIAGNOSIS — R7303 Prediabetes: Secondary | ICD-10-CM

## 2012-06-11 MED ORDER — FLUOXETINE HCL 20 MG PO CAPS: 20.0000 mg | ORAL_CAPSULE | Freq: Every day | ORAL | Status: DC

## 2012-06-11 NOTE — Patient Instructions (Addendum)
Weight loss would help your medical problems. Your laboratory studies are in the prediabetic range. Weight loss may also help your skin areas and flushes.  3500 calories is the energy content of a pound of body weight .Must have a 3500 cal deficit to lose one pound . Thus decrease 500 calorie equivalent per day in food or drink intake / or exercise  for 7 days to lose one pound.  Will inform you when Pap results are back.  If HPV is negative in your Pap is normal we can go to every 5 years on the Pap smear.  However I would recommend followup visit in 6 months regard to blood sugar monitoring  When you run out of the 10 mg of fluoxetine you can switch over to 20 mg and only take one a day

## 2012-06-11 NOTE — Addendum Note (Signed)
Addended by: Raj Janus on: 06/11/2012 04:03 PM   Modules accepted: Orders

## 2012-06-11 NOTE — Progress Notes (Signed)
Subjective:    Patient ID: Victoria Carter, female    DOB: 1965/02/04, 47 y.o.   MRN: [id number]  HPI Patient comes in today for Preventive Health Care visit  Fu of other issues Mood:  Mood helped on meds. Taking 20 mg fluoxetine most days lots of stress at work layoffs tends to made eat Weight has been op because of mood eating has been him to lose weight in the past but not recently. Some decrease in sleep LIPIDS: No side effects of medication taking 40 mg of Lipitor . Somewhat irregular last period April 15  no cramps since than occasional hot flashes. Gets bumps boils occasionally tender in the thigh area say she doesn't shave in that area unsure she is to something about it they do not drain. No injuries no tobacco She donates blood Review of Systems ROS:  GEN/ HEENT: No fever, headaches vision problems hearing changes, CV/ PULM; No chest pain shortness of breath cough, syncope,edema  change in exercise tolerance. GI /GU: No adominal pain, vomiting, change in bowel habits. No blood in the stool. No significant GU symptoms. SKIN/HEME: ,no acute skin rashes suspicious lesions or bleeding. No lymphadenopathy, nodules, masses.  NEURO/ PSYCH:  No neurologic signs such as weakness numbness. No depression anxiety. IMM/ Allergy: No unusual infections.  Allergy .   REST of 12 system review negative except as per HPI  Past history family history social history reviewed in the electronic medical record. Outpatient Encounter Prescriptions as of 06/11/2012  Medication Sig Dispense Refill  . atorvastatin (LIPITOR) 80 MG tablet TAKE 1/2 TABLET BY MOUTH EVERY DAY  30 tablet  3  . calcium-vitamin D (OSCAL WITH D) 250-125 MG-UNIT per tablet Take 2 tablets by mouth daily.        . diclofenac (CATAFLAM) 50 MG tablet TAKE 1 TABLET EVERY 4 TO 6 HOURS AS NEEDED FOR PAIN  30 tablet  0  . FLUoxetine (PROZAC) 10 MG capsule Take 1 capsule (10 mg total) by mouth daily.  90 capsule  0  . MULTIPLE VITAMIN PO  Take by mouth.              Objective:   Physical Exam BP 106/72  Pulse 96  Temp 97.8 F (36.6 C) (Oral)  Ht 5' 6.75" (1.695 m)  Wt 258 lb (117.028 kg)  BMI 40.71 kg/m2  LMP 04/14/2012  Wt Readings from Last 3 Encounters:  06/11/12 258 lb (117.028 kg)  06/05/11 250 lb (113.399 kg)  02/07/10 233 lb (105.688 kg)    Physical Exam: Vital signs reviewed [id number] is a well-developed well-nourished alert cooperative  white female who appears her stated age in no acute distress.  HEENT: normocephalic atraumatic , Eyes: PERRL EOM's full, conjunctiva clear, Nares: paten,t no deformity discharge or tenderness., Ears: no deformity EAC's clear TMs with normal landmarks. Mouth: clear OP, no lesions, edema.  Moist mucous membranes. Dentition in adequate repair. NECK: supple without masses, thyromegaly or bruits. CHEST/PULM:  Clear to auscultation and percussion breath sounds equal no wheeze , rales or rhonchi. No chest wall deformities or tenderness. Breast: normal by inspection . No dimpling, discharge, masses, tenderness or discharge . CV: PMI is nondisplaced, S1 S2 no gallops, murmurs, rubs. Peripheral pulses are full without delay.No JVD .  ABDOMEN: Bowel sounds normal nontender  No guard or rebound, no hepato splenomegal no CVA tenderness.  No hernia. Extremtities:  No clubbing cyanosis or edema, no acute joint swelling or redness no focal atrophy NEURO:  Oriented x3, cranial  nerves 3-12 appear to be intact, no obvious focal weakness,gait within normal limits no abnormal reflexes or asymmetrical SKIN: No acute rashes normal turgor, color, no bruising or petechiae. In the thigh area there are a few papules and one 8 mm subcutaneous cystic area that is dark nonfluctuant. No adenopathy PSYCH: Oriented, good eye contact, no obvious depression anxiety, cognition and judgment appear normal. LN: no cervical axillary inguinal adenopathy Pelvic: NL ext GU, labia clear without lesions or rash .  Vagina no lesions .Cervix: clear  UTERUS: Neg CMT Adnexa:  clear no masses . PAP done HPV high-risk   Lab Results  Component Value Date   WBC 4.8 06/04/2012   HGB 11.9* 06/04/2012   HCT 36.5 06/04/2012   PLT 229.0 06/04/2012   GLUCOSE 106* 06/04/2012   CHOL 141 06/04/2012   TRIG 106.0 06/04/2012   HDL 44.30 06/04/2012   LDLCALC 76 06/04/2012   ALT 19 06/04/2012   AST 20 06/04/2012   NA 142 06/04/2012   K 4.2 06/04/2012   CL 107 06/04/2012   CREATININE 1.0 06/04/2012   BUN 20 06/04/2012   CO2 26 06/04/2012   TSH 4.13 06/04/2012       Assessment & Plan:  Preventive Health Care Counseled regarding healthy nutrition, exercise, sleep, injury prevention, calcium vit d and healthy weight . Pap done today history of abnormal 20 years ago treated with laser has been normal since then.  HPV co testing done today. Tetanus booster today Donates blood  explanation for the borderline hemoglobin as well as perimenopausal. TSH high normal     Recheck at next labs. Hyperlipidemia family history of heart disease controlled OBESITY. worse stress eating discussed strategies importance for her health. Hyperglycemia was normal last year back into the prediabetic range this year importance of lifestyle intervention weight loss Mood doing well on the fluoxetine wants to stay on it at this point prescription rewritten for 20 mg to take at one time she is taking 20 mg most days.

## 2012-06-11 NOTE — Addendum Note (Signed)
Addended by: Alfred Levins D on: 06/11/2012 11:32 AM   Modules accepted: Orders

## 2012-06-18 ENCOUNTER — Encounter: Payer: Self-pay | Admitting: Family Medicine

## 2012-06-18 NOTE — Progress Notes (Signed)
Quick Note:  Tell patient PAP is normal. And High risk hpv negative. ______

## 2012-06-26 ENCOUNTER — Other Ambulatory Visit: Payer: Self-pay | Admitting: Internal Medicine

## 2012-09-04 ENCOUNTER — Other Ambulatory Visit: Payer: Self-pay | Admitting: Internal Medicine

## 2012-09-04 DIAGNOSIS — Z1231 Encounter for screening mammogram for malignant neoplasm of breast: Secondary | ICD-10-CM

## 2012-09-11 ENCOUNTER — Ambulatory Visit (INDEPENDENT_AMBULATORY_CARE_PROVIDER_SITE_OTHER): Payer: Managed Care, Other (non HMO)

## 2012-09-11 DIAGNOSIS — Z23 Encounter for immunization: Secondary | ICD-10-CM

## 2012-10-08 ENCOUNTER — Ambulatory Visit
Admission: RE | Admit: 2012-10-08 | Discharge: 2012-10-08 | Disposition: A | Discharge status: Home / Self Care | Payer: Managed Care, Other (non HMO) | Source: Ambulatory Visit | Attending: Internal Medicine | Admitting: Internal Medicine

## 2012-10-08 DIAGNOSIS — Z1231 Encounter for screening mammogram for malignant neoplasm of breast: Secondary | ICD-10-CM

## 2012-10-09 IMAGING — MG MM DIGITAL SCREENING BILAT
8 series · 8 of 24 positions shown · non-contrast
Comparison: Previous exams.

CLINICAL DATA: Screening.

DIGITAL BILATERAL SCREENING MAMMOGRAM WITH CAD
DIGITAL BREAST TOMOSYNTHESIS
Digital breast tomosynthesis images are acquired in two
projections.  These images are reviewed in combination with the
digital mammogram, confirming the findings below.

[L MLO]
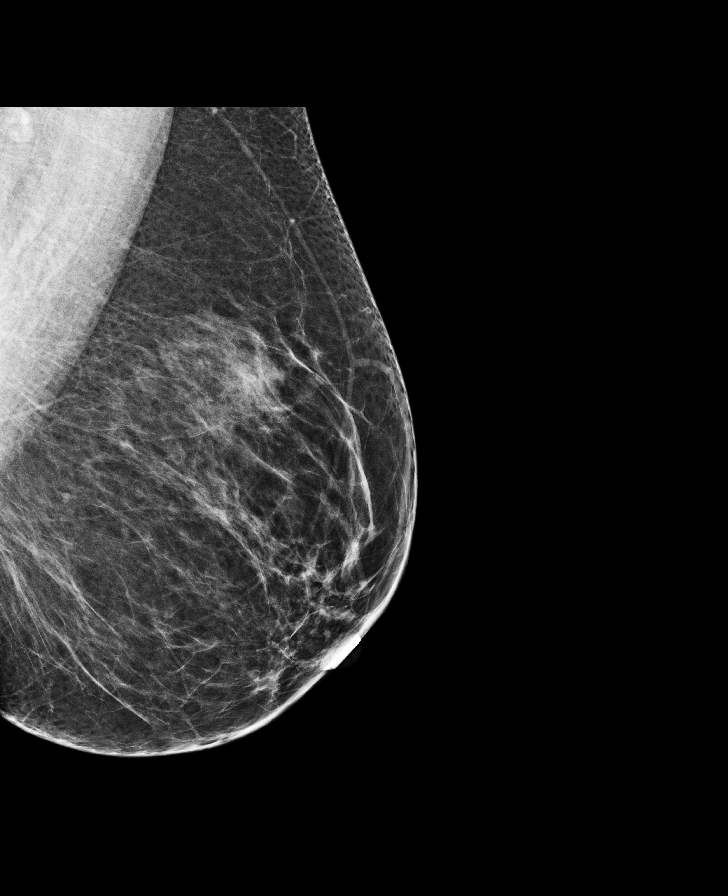

[R MLO]
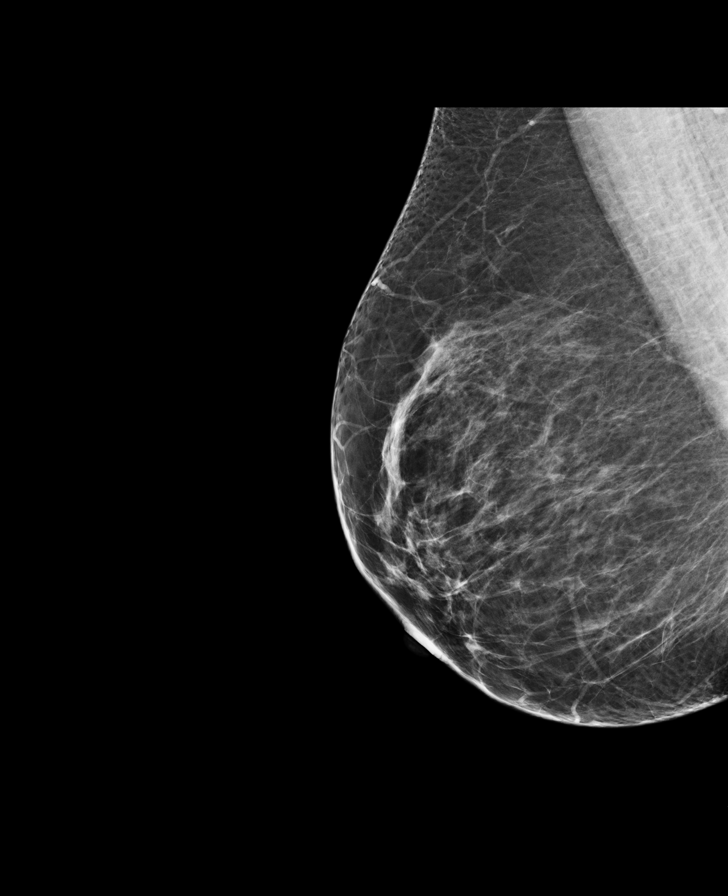

[R CC]
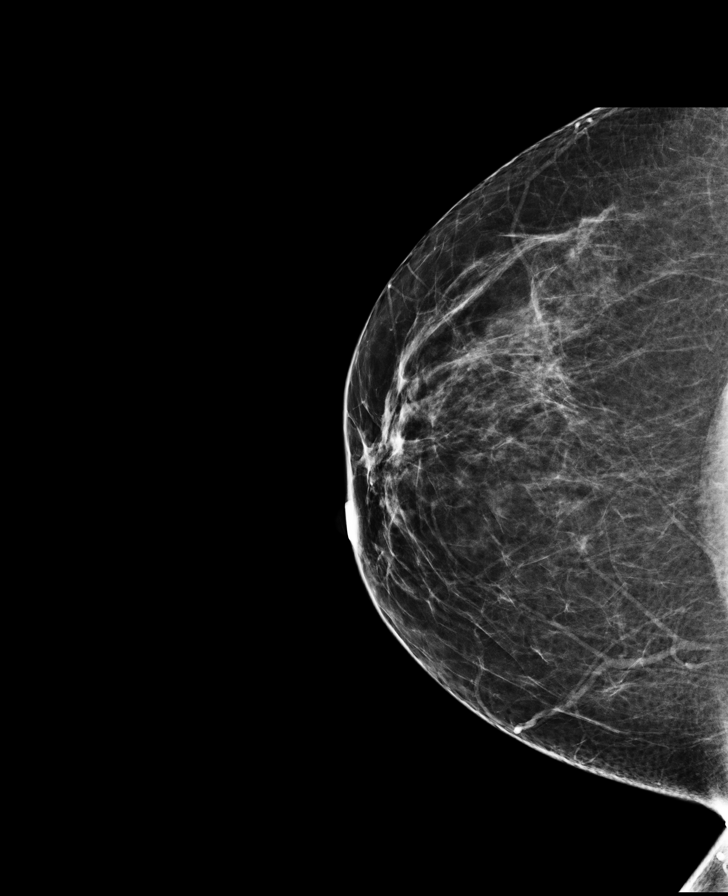

[L CC]
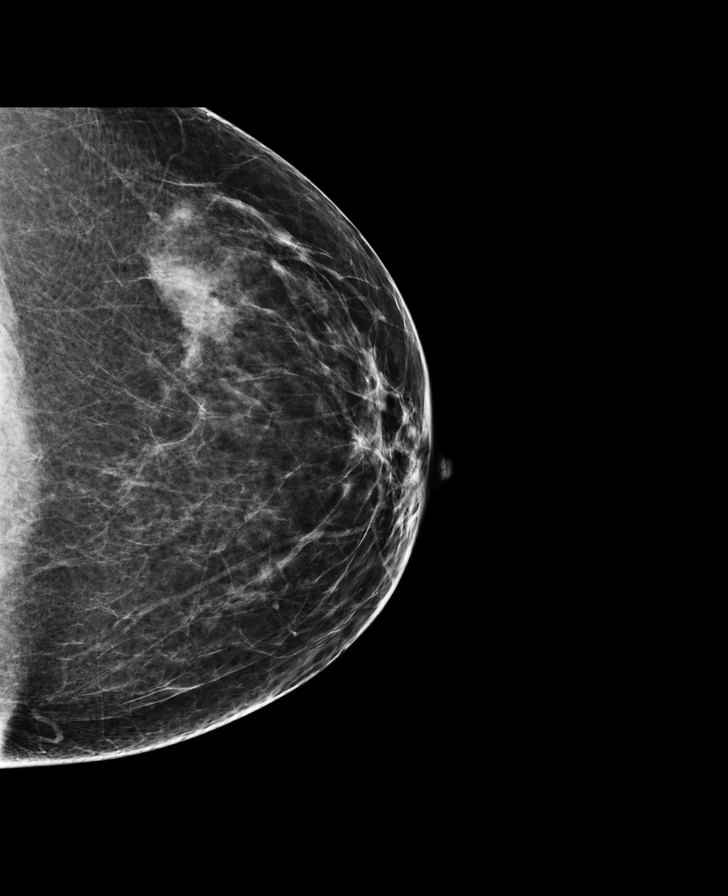

[RMLO COMBO BREAST TOMOSYNTHESIS IMAGE tomo · tomo slice 35/70.0]
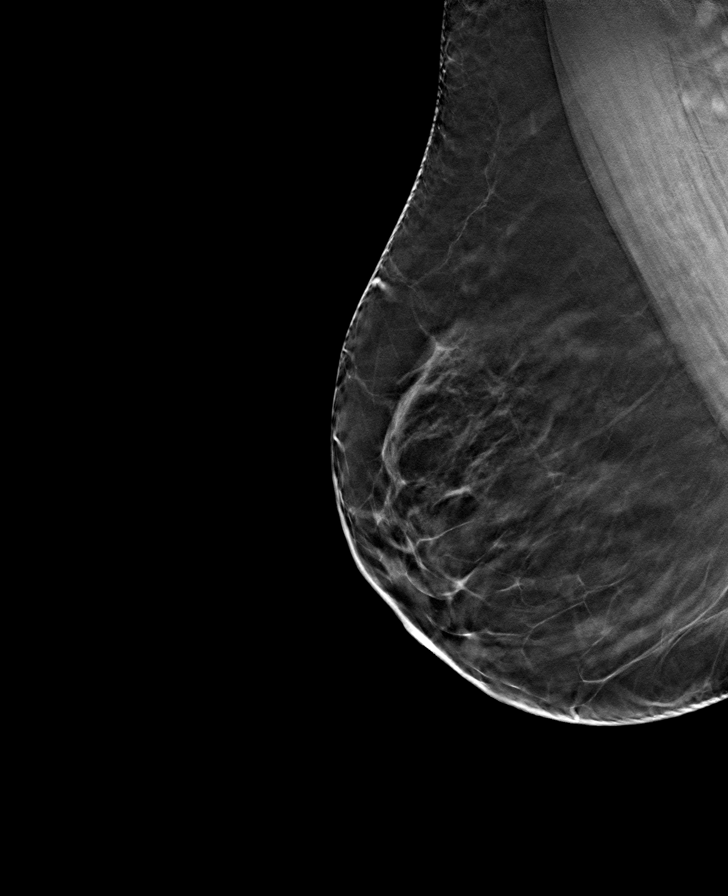

[LCC COMBO BREAST TOMOSYNTHESIS IMAGE tomo · tomo slice 35/68.0]
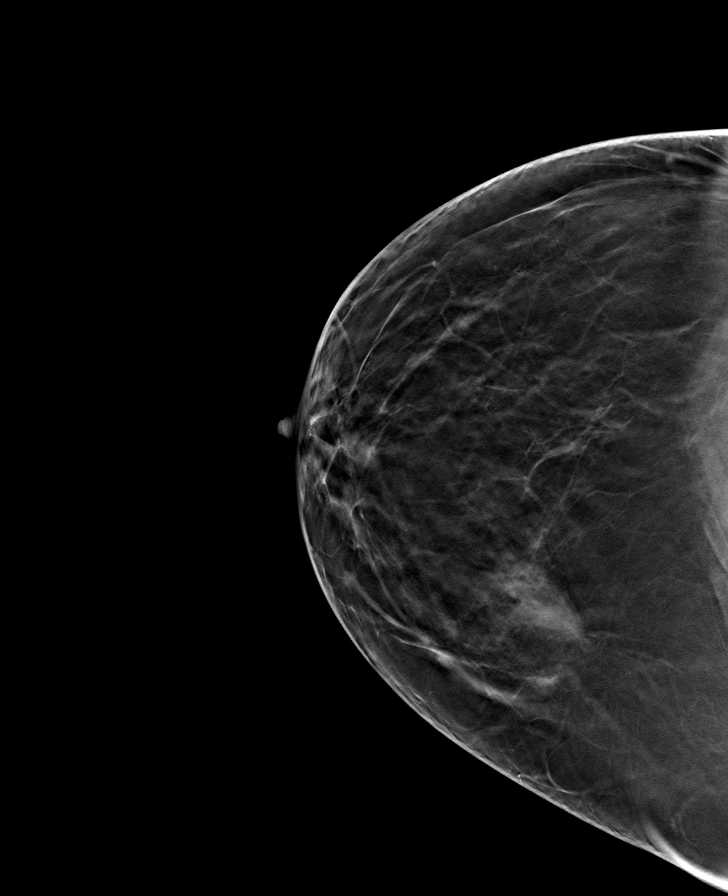

[LMLO COMBO BREAST TOMOSYNTHESIS IMAGE tomo · tomo slice 37/74.0]
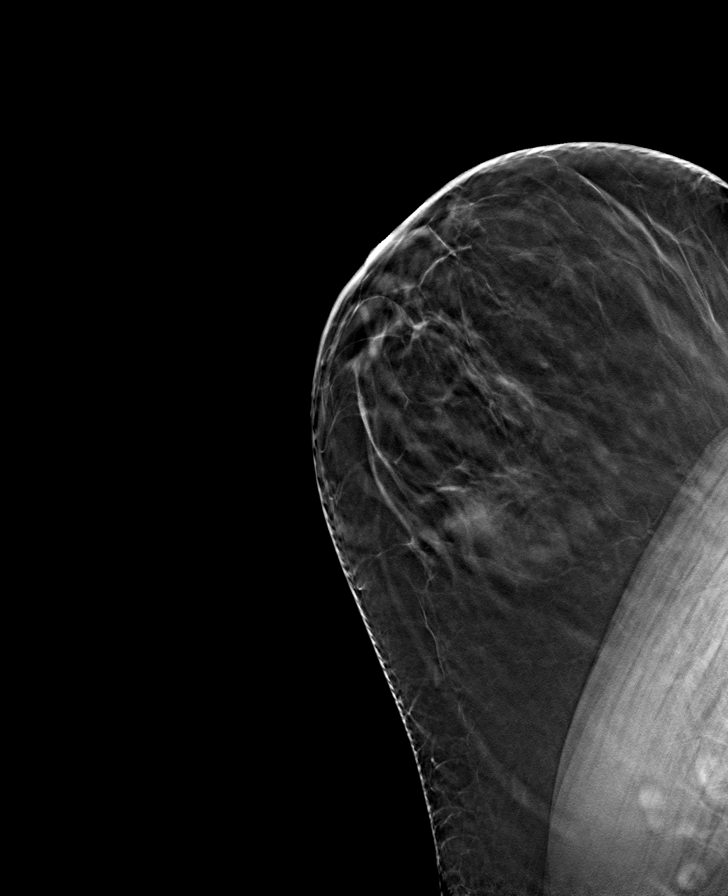

[RCC COMBO BREAST TOMOSYNTHESIS IMAGE tomo · tomo slice 31/62.0]
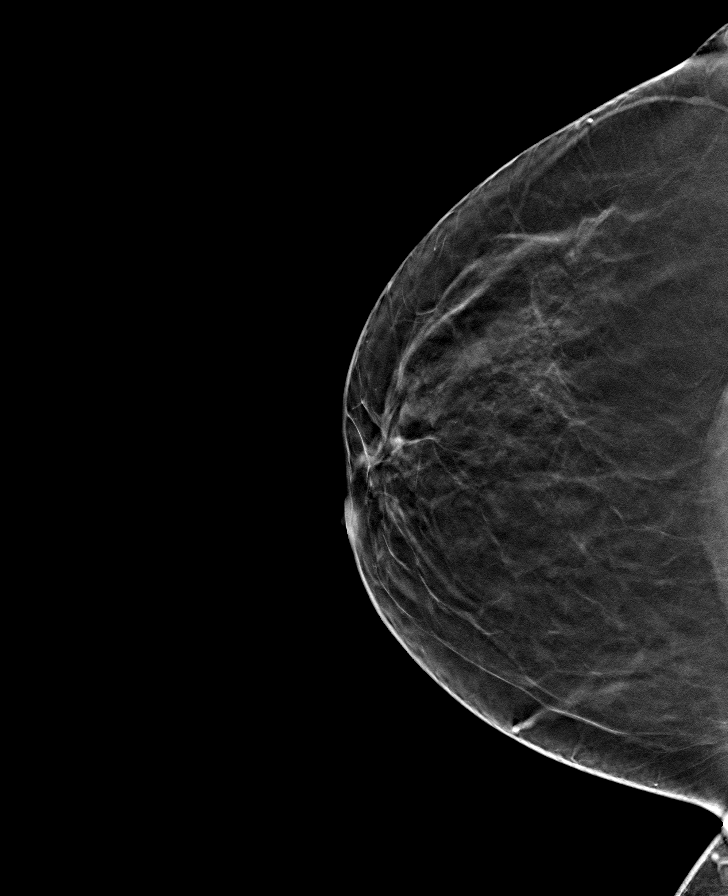

[8 of 24 positions shown; findings below may reference images not displayed]

FINDINGS: There are scattered fibroglandular densities. No
suspicious masses, architectural distortion, or calcifications are
present.

Images were processed with CAD.
IMPRESSION: No mammographic evidence of malignancy.

A result letter of this screening mammogram will be mailed directly
to the patient.

RECOMMENDATION:
Screening mammogram in one year. (Code:WK-W-HXX)

BI-RADS CATEGORY 1:  Negative.

## 2012-11-14 LAB — HM COLONOSCOPY

## 2012-11-17 ENCOUNTER — Encounter: Payer: Self-pay | Admitting: Internal Medicine

## 2012-11-20 ENCOUNTER — Other Ambulatory Visit (INDEPENDENT_AMBULATORY_CARE_PROVIDER_SITE_OTHER): Payer: Managed Care, Other (non HMO)

## 2012-11-20 DIAGNOSIS — R7303 Prediabetes: Secondary | ICD-10-CM

## 2012-11-20 DIAGNOSIS — R946 Abnormal results of thyroid function studies: Secondary | ICD-10-CM

## 2012-11-20 DIAGNOSIS — R7309 Other abnormal glucose: Secondary | ICD-10-CM

## 2012-11-20 DIAGNOSIS — R7989 Other specified abnormal findings of blood chemistry: Secondary | ICD-10-CM

## 2012-11-20 LAB — T4, FREE: Free T4: 0.6 ng/dL (ref 0.60–1.60)

## 2012-11-20 LAB — HEMOGLOBIN A1C: Hgb A1c MFr Bld: 5.8 % (ref 4.6–6.5)

## 2012-11-20 LAB — TSH: TSH: 3.94 u[IU]/mL (ref 0.35–5.50)

## 2012-12-10 ENCOUNTER — Encounter: Payer: Self-pay | Admitting: Internal Medicine

## 2012-12-10 ENCOUNTER — Ambulatory Visit (INDEPENDENT_AMBULATORY_CARE_PROVIDER_SITE_OTHER): Payer: Managed Care, Other (non HMO) | Admitting: Internal Medicine

## 2012-12-10 VITALS — BP 104/70 | HR 81 | Temp 98.3°F | Wt 229.0 lb

## 2012-12-10 DIAGNOSIS — F329 Major depressive disorder, single episode, unspecified: Secondary | ICD-10-CM

## 2012-12-10 DIAGNOSIS — E782 Mixed hyperlipidemia: Secondary | ICD-10-CM

## 2012-12-10 DIAGNOSIS — E669 Obesity, unspecified: Secondary | ICD-10-CM

## 2012-12-10 DIAGNOSIS — Z860101 Personal history of adenomatous and serrated colon polyps: Secondary | ICD-10-CM

## 2012-12-10 DIAGNOSIS — R7309 Other abnormal glucose: Secondary | ICD-10-CM

## 2012-12-10 DIAGNOSIS — Z86010 Personal history of colonic polyps: Secondary | ICD-10-CM

## 2012-12-10 DIAGNOSIS — F3289 Other specified depressive episodes: Secondary | ICD-10-CM

## 2012-12-10 DIAGNOSIS — R7303 Prediabetes: Secondary | ICD-10-CM

## 2012-12-10 MED ORDER — FLUOXETINE HCL 10 MG PO CAPS: 10.0000 mg | ORAL_CAPSULE | Freq: Every day | ORAL | Status: DC

## 2012-12-10 MED ORDER — ATORVASTATIN CALCIUM 40 MG PO TABS: 20.0000 mg | ORAL_TABLET | Freq: Every day | ORAL | Status: DC

## 2012-12-10 NOTE — Progress Notes (Signed)
Chief Complaint  Patient presents with  . Follow-up    weight thyroid   . Hypertension   HPI: Patient comes in today for follow up of  multiple medical problems.  Since last visit: Weight : lost weight   Hungry girl.  Exercise : exercising  5-7 days per week.  Has a trainer  Rectal bleeding had colonoscopy and  Had 6 polyps.  On 5 year plan.  ROS: See pertinent positives and negatives per HPI.  Past Medical History  Diagnosis Date  . Hx of abnormal cervical Pap smear     used cryo when first married and pregnant  . Sinusitis   . Hyperlipidemia   . Allergic rhinitis   . Depression   . UTI (lower urinary tract infection)   . Hyperglycemia     Family History  Problem Relation Age of Onset  . Heart attack Brother     age 12  . Diabetes type II    . Hyperlipidemia    . Factor V Leiden deficiency Daughter     Also husband    History   Social History  . Marital Status: Married    Spouse Name: N/A    Number of Children: N/A  . Years of Education: N/A   Social History Main Topics  . Smoking status: Former Games developer  . Smokeless tobacco: None  . Alcohol Use: 0.6 oz/week    1 Glasses of wine per week  . Drug Use: No  . Sexually Active:    Other Topics Concern  . None   Social History Narrative   Occupation: 40 per week minimumMarriedRegular exercise- yesHH of 3-4  girls home from collegePet [id number]    Outpatient Encounter Prescriptions as of 12/10/2012  Medication Sig Dispense Refill  . atorvastatin (LIPITOR) 40 MG tablet Take 0.5 tablets (20 mg total) by mouth daily.  30 tablet  5  . calcium-vitamin D (OSCAL WITH D) 250-125 MG-UNIT per tablet Take 2 tablets by mouth daily.        Marland Kitchen FLUoxetine (PROZAC) 10 MG capsule Take 1 capsule (10 mg total) by mouth daily.  90 capsule  3  . MULTIPLE VITAMIN PO Take by mouth.        . [DISCONTINUED] atorvastatin (LIPITOR) 80 MG tablet TAKE 1/2 TABLET BY MOUTH EVERY DAY  30 tablet  3  . [DISCONTINUED] FLUoxetine (PROZAC) 20 MG  capsule Take 1 capsule (20 mg total) by mouth daily.  90 capsule  3  . diclofenac (CATAFLAM) 50 MG tablet TAKE 1 TABLET EVERY 4 TO 6 HOURS AS NEEDED FOR PAIN  30 tablet  0  . FLUoxetine (PROZAC) 10 MG capsule Take 1 capsule (10 mg total) by mouth daily.  90 capsule  3  . [DISCONTINUED] FLUoxetine (PROZAC) 10 MG capsule Take 1 capsule (10 mg total) by mouth daily.  90 capsule  0    EXAM:  BP 104/70  Pulse 81  Temp 98.3 F (36.8 C) (Oral)  Wt 229 lb (103.874 kg)  SpO2 94%  LMP 04/15/2012  There is no height on file to calculate BMI. Wt Readings from Last 3 Encounters:  12/10/12 229 lb (103.874 kg)  06/11/12 258 lb (117.028 kg)  06/05/11 250 lb (113.399 kg)    GENERAL: vitals reviewed and listed above, alert, oriented, appears well hydrated and in no acute distress  HEENT: atraumatic, conjunctiva  clear, no obvious abnormalities on inspection of external nose and ears OP : no lesion edema or exudate   NECK: no  obvious masses on inspection palpation   LUNGS: clear to auscultation bilaterally, no wheezes, rales or rhonchi, good air movement  CV: HRRR, no clubbing cyanosis or  peripheral edema nl cap refill   MS: moves all extremities without noticeable focal  abnormality  PSYCH: pleasant and cooperative, no obvious depression or anxiety  ASSESSMENT AND PLAN:  Discussed the following assessment and plan:  1. Obesity    improved with weight loss   2. HYPERGLYCEMIA, FASTING    ha 1c 5.8 .contineu weight loss  3. Pre-diabetes   4. HYPERLIPIDEMIA    asks about dec dose ok to dec to 20 of lipitor and check at next cpx   5. DEPRESSION poss pmdd    doing better  ok to decrease dosing  to 10 mg per day ans ee how she does  6. Hx of adenomatous colonic polyps    5 year recall.    -Patient advised to return or notify health care team  immediately if symptoms worsen or persist or new concerns arise.  Patient Instructions  Ok to try decrease lipitor to 1/2 of 40     Can try  to decrease  To fluoxetine 10 per day . And see how your do  Continue lifestyle intervention healthy eating and exercise . cpx with labs in 6 months or as needed      Neta Mends. Panosh M.D.   ROS: See pertinent positives and negatives per HPI.  Past Medical History  Diagnosis Date  . Hx of abnormal cervical Pap smear     used cryo when first married and pregnant  . Sinusitis   . Hyperlipidemia   . Allergic rhinitis   . Depression   . UTI (lower urinary tract infection)   . Hyperglycemia     Family History  Problem Relation Age of Onset  . Heart attack Brother     age 61  . Diabetes type II    . Hyperlipidemia    . Factor V Leiden deficiency Daughter     Also husband    History   Social History  . Marital Status: Married    Spouse Name: N/A    Number of Children: N/A  . Years of Education: N/A   Social History Main Topics  . Smoking status: Former Games developer  . Smokeless tobacco: None  . Alcohol Use: 0.6 oz/week    1 Glasses of wine per week  . Drug Use: No  . Sexually Active:    Other Topics Concern  . None   Social History Narrative   Occupation: 40 per week minimumMarriedRegular exercise- yesHH of 3-4  girls home from collegePet [id number]    Outpatient Encounter Prescriptions as of 12/10/2012  Medication Sig Dispense Refill  . atorvastatin (LIPITOR) 40 MG tablet Take 0.5 tablets (20 mg total) by mouth daily.  30 tablet  5  . calcium-vitamin D (OSCAL WITH D) 250-125 MG-UNIT per tablet Take 2 tablets by mouth daily.        Marland Kitchen FLUoxetine (PROZAC) 10 MG capsule Take 1 capsule (10 mg total) by mouth daily.  90 capsule  3  . MULTIPLE VITAMIN PO Take by mouth.        . [DISCONTINUED] atorvastatin (LIPITOR) 80 MG tablet TAKE 1/2 TABLET BY MOUTH EVERY DAY  30 tablet  3  . [DISCONTINUED] FLUoxetine (PROZAC) 20 MG capsule Take 1 capsule (20 mg total) by mouth daily.  90 capsule  3  . diclofenac (CATAFLAM) 50 MG tablet TAKE 1 TABLET  EVERY 4 TO 6 HOURS AS NEEDED FOR  PAIN  30 tablet  0  . FLUoxetine (PROZAC) 10 MG capsule Take 1 capsule (10 mg total) by mouth daily.  90 capsule  3  . [DISCONTINUED] FLUoxetine (PROZAC) 10 MG capsule Take 1 capsule (10 mg total) by mouth daily.  90 capsule  0    EXAM:  BP 104/70  Pulse 81  Temp 98.3 F (36.8 C) (Oral)  Wt 229 lb (103.874 kg)  SpO2 94%  LMP 04/15/2012  There is no height on file to calculate BMI.  GENERAL: vitals reviewed and listed above, alert, oriented, appears well hydrated and in no acute distress  HEENT: atraumatic, conjunctiva  clear, no obvious abnormalities on inspection of external nose and ears OP : no lesion edema or exudate   NECK: no obvious masses on inspection palpation   LUNGS: clear to auscultation bilaterally, no wheezes, rales or rhonchi, good air movement  CV: HRRR, no clubbing cyanosis or  peripheral edema nl cap refill   MS: moves all extremities without noticeable focal  abnormality  PSYCH: pleasant and cooperative, no obvious depression or anxiety Lab Results  Component Value Date   WBC 4.8 06/04/2012   HGB 11.9* 06/04/2012   HCT 36.5 06/04/2012   PLT 229.0 06/04/2012   GLUCOSE 106* 06/04/2012   CHOL 141 06/04/2012   TRIG 106.0 06/04/2012   HDL 44.30 06/04/2012   LDLCALC 76 06/04/2012   ALT 19 06/04/2012   AST 20 06/04/2012   NA 142 06/04/2012   K 4.2 06/04/2012   CL 107 06/04/2012   CREATININE 1.0 06/04/2012   BUN 20 06/04/2012   CO2 26 06/04/2012   TSH 3.94 11/20/2012   HGBA1C 5.8 11/20/2012    ASSESSMENT AND PLAN:  Discussed the following assessment and plan:  1. Obesity    improved with weight loss   2. HYPERGLYCEMIA, FASTING    ha 1c 5.8 .contineu weight loss  3. Pre-diabetes   4. HYPERLIPIDEMIA    asks about dec dose ok to dec to 20 of lipitor and check at next cpx   5. DEPRESSION poss pmdd    doing better  ok to decrease dosing  to 10 mg per day ans ee how she does  6. Hx of adenomatous colonic polyps    5 year recall.    -Patient advised to return or notify health  care team  immediately if symptoms worsen or persist or new concerns arise.  Patient Instructions  Ok to try decrease lipitor to 1/2 of 40     Can try to decrease  To fluoxetine 10 per day . And see how your do  Continue lifestyle intervention healthy eating and exercise . cpx with labs in 6 months or as needed   Total visit > 50% spent counseling and coordinating care     [place] K. Panosh M.D.

## 2012-12-10 NOTE — Patient Instructions (Addendum)
Ok to try decrease lipitor to 1/2 of 40     Can try to decrease  To fluoxetine 10 per day . And see how your do  Continue lifestyle intervention healthy eating and exercise . cpx with labs in 6 months or as needed

## 2012-12-11 ENCOUNTER — Ambulatory Visit: Payer: Managed Care, Other (non HMO) | Admitting: Internal Medicine

## 2013-03-12 ENCOUNTER — Ambulatory Visit: Payer: Managed Care, Other (non HMO) | Admitting: Family Medicine

## 2013-03-12 VITALS — BP 110/70 | HR 72 | Temp 97.5°F | Resp 16 | Ht 66.5 in | Wt 246.0 lb

## 2013-03-12 DIAGNOSIS — J0100 Acute maxillary sinusitis, unspecified: Secondary | ICD-10-CM

## 2013-03-12 DIAGNOSIS — J069 Acute upper respiratory infection, unspecified: Secondary | ICD-10-CM

## 2013-03-12 MED ORDER — HYDROCODONE-HOMATROPINE 5-1.5 MG/5ML PO SYRP: 5.0000 mL | ORAL_SOLUTION | Freq: Three times a day (TID) | ORAL | Status: DC | PRN

## 2013-03-12 MED ORDER — AMOXICILLIN 875 MG PO TABS: 875.0000 mg | ORAL_TABLET | Freq: Two times a day (BID) | ORAL | Status: DC

## 2013-03-12 NOTE — Patient Instructions (Signed)
Amoxicillin for the next 14 days twice a day.  Cough syrup as needed and don't drive with it.

## 2013-03-12 NOTE — Progress Notes (Signed)
Victoria Carter is a 48 y.o. female who presents to Urgent Care today with complaints of URI symptoms:  1.  URI symptoms:  Present for past month or so.  Describes sinus congestion for same amount of time.  Has thick green mucus that she can feel run down back of her throat and causes cough most evenings when lying prone.  Has tried nothing for relief.   Sick contacts are none of which she knows.  No fevers or chills. No nausea or vomiting.     PMH reviewed.  Past Medical History  Diagnosis Date  . Hx of abnormal cervical Pap smear     used cryo when first married and pregnant  . Sinusitis   . Hyperlipidemia   . Allergic rhinitis   . Depression   . UTI (lower urinary tract infection)   . Hyperglycemia    Past Surgical History  Procedure Laterality Date  . Breast biopsy  1987  . Cesarean section    . Cervical biopsy      Medications reviewed. Current Outpatient Prescriptions  Medication Sig Dispense Refill  . atorvastatin (LIPITOR) 40 MG tablet Take 0.5 tablets (20 mg total) by mouth daily.  30 tablet  5  . calcium-vitamin D (OSCAL WITH D) 250-125 MG-UNIT per tablet Take 2 tablets by mouth daily.        Marland Kitchen FLUoxetine (PROZAC) 10 MG capsule Take 1 capsule (10 mg total) by mouth daily.  90 capsule  3  . MULTIPLE VITAMIN PO Take by mouth.        . diclofenac (CATAFLAM) 50 MG tablet TAKE 1 TABLET EVERY 4 TO 6 HOURS AS NEEDED FOR PAIN  30 tablet  0   No current facility-administered medications for this visit.    ROS as above otherwise neg.  No chest pain, palpitations, SOB, Fever, Chills, Abd pain, N/V/D.   Physical Exam:  BP 110/70  Pulse 72  Temp(Src) 97.5 F (36.4 C) (Oral)  Resp 16  Ht 5' 6.5" (1.689 m)  Wt 246 lb (111.585 kg)  BMI 39.12 kg/m2  SpO2 97% Gen:  Patient sitting on exam table, appears stated age in no acute distress Head: Normocephalic atraumatic Eyes: EOMI, PERRL, sclera and conjunctiva non-erythematous Ears:  Canals clear bilaterally.  TMs pearly gray  bilaterally without erythema or bulging.   Nose:  Nasal turbinates grossly enlarged bilaterally. Some exudates noted. Tender to palpation of maxillary sinus  Mouth: Mucosa membranes moist. Tonsils +2, nonenlarged, non-erythematous. Neck: No cervical lymphadenopathy noted Heart:  RRR, no murmurs auscultated. Pulm:  Clear to auscultation bilaterally with good air movement.  No wheezes or rales noted.      Assessment and Plan:  1.  Sinusitis: - Treat with Amoxicillin x 2 weeks.  - FU at that point if no improvement. - No evidence of bronchitis/other cause of cause, likely secondary to sinus drainage.

## 2013-05-01 ENCOUNTER — Other Ambulatory Visit: Payer: Self-pay | Admitting: Family Medicine

## 2013-05-01 MED ORDER — ATORVASTATIN CALCIUM 40 MG PO TABS: 20.0000 mg | ORAL_TABLET | Freq: Every day | ORAL | Status: DC

## 2013-06-05 ENCOUNTER — Other Ambulatory Visit: Payer: Managed Care, Other (non HMO)

## 2013-06-12 ENCOUNTER — Encounter: Payer: Managed Care, Other (non HMO) | Admitting: Internal Medicine

## 2013-09-08 ENCOUNTER — Other Ambulatory Visit: Payer: Self-pay

## 2013-09-08 DIAGNOSIS — Z1231 Encounter for screening mammogram for malignant neoplasm of breast: Secondary | ICD-10-CM

## 2013-09-09 ENCOUNTER — Other Ambulatory Visit (INDEPENDENT_AMBULATORY_CARE_PROVIDER_SITE_OTHER): Payer: Managed Care, Other (non HMO)

## 2013-09-09 ENCOUNTER — Encounter: Payer: Managed Care, Other (non HMO) | Admitting: Internal Medicine

## 2013-09-09 DIAGNOSIS — Z0000 Encounter for general adult medical examination without abnormal findings: Secondary | ICD-10-CM

## 2013-09-09 LAB — HEPATIC FUNCTION PANEL
ALT: 26 U/L (ref 0–35)
AST: 22 U/L (ref 0–37)
Albumin: 4.4 g/dL (ref 3.5–5.2)
Alkaline Phosphatase: 73 U/L (ref 39–117)
Bilirubin, Direct: 0 mg/dL (ref 0.0–0.3)
Total Bilirubin: 0.8 mg/dL (ref 0.3–1.2)
Total Protein: 7.3 g/dL (ref 6.0–8.3)

## 2013-09-09 LAB — LIPID PANEL
Cholesterol: 196 mg/dL (ref 0–200)
HDL: 45.4 mg/dL (ref >39.00)
LDL Cholesterol: 114 mg/dL — ABNORMAL HIGH (ref 0–99)
Total CHOL/HDL Ratio: 4
Triglycerides: 182 mg/dL — ABNORMAL HIGH (ref 0.0–149.0)
VLDL: 36.4 mg/dL (ref 0.0–40.0)

## 2013-09-09 LAB — CBC WITH DIFFERENTIAL/PLATELET
Basophils Absolute: 0 10*3/uL (ref 0.0–0.1)
Basophils Relative: 0.4 % (ref 0.0–3.0)
Eosinophils Absolute: 0.3 10*3/uL (ref 0.0–0.7)
Eosinophils Relative: 4.8 % (ref 0.0–5.0)
HCT: 39.8 % (ref 36.0–46.0)
Hemoglobin: 13.4 g/dL (ref 12.0–15.0)
Lymphocytes Relative: 27.9 % (ref 12.0–46.0)
Lymphs Abs: 1.5 10*3/uL (ref 0.7–4.0)
MCHC: 33.7 g/dL (ref 30.0–36.0)
MCV: 85.8 fl (ref 78.0–100.0)
Monocytes Absolute: 0.4 10*3/uL (ref 0.1–1.0)
Monocytes Relative: 7.9 % (ref 3.0–12.0)
Neutro Abs: 3.1 10*3/uL (ref 1.4–7.7)
Neutrophils Relative %: 59 % (ref 43.0–77.0)
Platelets: 245 10*3/uL (ref 150.0–400.0)
RBC: 4.64 Mil/uL (ref 3.87–5.11)
RDW: 13.6 % (ref 11.5–14.6)
WBC: 5.3 10*3/uL (ref 4.5–10.5)

## 2013-09-09 LAB — HEMOGLOBIN A1C: Hgb A1c MFr Bld: 6.2 % (ref 4.6–6.5)

## 2013-09-09 LAB — BASIC METABOLIC PANEL
BUN: 15 mg/dL (ref 6–23)
CO2: 27 mEq/L (ref 19–32)
Calcium: 9.4 mg/dL (ref 8.4–10.5)
Chloride: 104 mEq/L (ref 96–112)
Creatinine, Ser: 0.9 mg/dL (ref 0.4–1.2)
GFR: 67.98 mL/min (ref >60.00)
Glucose, Bld: 112 mg/dL — ABNORMAL HIGH (ref 70–99)
Potassium: 4 mEq/L (ref 3.5–5.1)
Sodium: 138 mEq/L (ref 135–145)

## 2013-09-09 LAB — TSH: TSH: 4.9 u[IU]/mL (ref 0.35–5.50)

## 2013-09-16 ENCOUNTER — Ambulatory Visit (INDEPENDENT_AMBULATORY_CARE_PROVIDER_SITE_OTHER): Payer: Managed Care, Other (non HMO) | Admitting: Internal Medicine

## 2013-09-16 ENCOUNTER — Encounter: Payer: Self-pay | Admitting: Internal Medicine

## 2013-09-16 VITALS — BP 114/80 | HR 82 | Temp 97.7°F | Ht 66.75 in | Wt 258.0 lb

## 2013-09-16 DIAGNOSIS — Z8249 Family history of ischemic heart disease and other diseases of the circulatory system: Secondary | ICD-10-CM

## 2013-09-16 DIAGNOSIS — Z6841 Body Mass Index (BMI) 40.0 and over, adult: Secondary | ICD-10-CM

## 2013-09-16 DIAGNOSIS — R7309 Other abnormal glucose: Secondary | ICD-10-CM

## 2013-09-16 DIAGNOSIS — N951 Menopausal and female climacteric states: Secondary | ICD-10-CM

## 2013-09-16 DIAGNOSIS — Z0000 Encounter for general adult medical examination without abnormal findings: Secondary | ICD-10-CM

## 2013-09-16 DIAGNOSIS — F329 Major depressive disorder, single episode, unspecified: Secondary | ICD-10-CM

## 2013-09-16 DIAGNOSIS — Z23 Encounter for immunization: Secondary | ICD-10-CM

## 2013-09-16 DIAGNOSIS — E782 Mixed hyperlipidemia: Secondary | ICD-10-CM

## 2013-09-16 DIAGNOSIS — R7303 Prediabetes: Secondary | ICD-10-CM

## 2013-09-16 MED ORDER — ATORVASTATIN CALCIUM 40 MG PO TABS: 40.0000 mg | ORAL_TABLET | Freq: Every day | ORAL | Status: DC

## 2013-09-16 MED ORDER — FLUOXETINE HCL 20 MG PO CAPS: 20.0000 mg | ORAL_CAPSULE | Freq: Every day | ORAL | Status: DC

## 2013-09-16 NOTE — Progress Notes (Signed)
Chief Complaint  Patient presents with  . Annual Exam    HPI: Patient comes in today for Preventive Health Care visit  Patient suspect mole negative ent comes in today for follow up of  multiple medical problems.  No tobacco etoh social  caffiene diet coke 2 d per week.  working inside home   49 fluoxetine working ok better on 20 mg per day than 10 mg  h ad  Stopped th or decrease the lipitor but not cause of se  Only back on this for month? Snores no known OSA  sx  ROS:  GEN/ HEENT: No fever, significant weight changes sweats headaches vision problems hearing changes, CV/ PULM; No chest pain shortness of breath cough, syncope,edema  change in exercise tolerance. GI /GU: No adominal pain, vomiting, change in bowel habits. No blood in the stool. No significant GU symptoms. SKIN/HEME: ,no acute skin rashes suspicious lesions or bleeding. No lymphadenopathy, nodules, masses.  NEURO/ PSYCH:  No neurologic signs such as weakness numbness. No depression anxiety. IMM/ Allergy: No unusual infections.  Allergy .   REST of 12 system review negative except as per HPI   Past Medical History  Diagnosis Date  . Hx of abnormal cervical Pap smear     used cryo when first married and pregnant  . Sinusitis   . Hyperlipidemia   . Allergic rhinitis   . Depression   . UTI (lower urinary tract infection)   . Hyperglycemia     Family History  Problem Relation Age of Onset  . Heart attack Brother     age 84  . Diabetes type II    . Hyperlipidemia    . Factor V Leiden deficiency Daughter     Also husband    History   Social History  . Marital Status: Married    Spouse Name: N/A    Number of Children: N/A  . Years of Education: N/A   Social History Main Topics  . Smoking status: Former Games developer  . Smokeless tobacco: None  . Alcohol Use: 0.6 oz/week    1 Glasses of wine per week  . Drug Use: No  . Sexual Activity:    Other Topics Concern  . None   Social History Narrative    Occupation: 40 per week 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    Married   Regular exercise- yes   HH of 2-  girls home from college   Pet dogs   G2P2    Outpatient Encounter Prescriptions as of 09/16/2013  Medication Sig Dispense Refill  . atorvastatin (LIPITOR) 40 MG tablet Take 1 tablet (40 mg total) by mouth daily.  90 tablet  3  . calcium-vitamin D (OSCAL WITH D) 250-125 MG-UNIT per tablet Take 2 tablets by mouth daily.        Marland Kitchen FLUoxetine (PROZAC) 20 MG capsule Take 1 capsule (20 mg total) by mouth daily.  90 capsule  3  . MULTIPLE VITAMIN PO Take by mouth.        . ranitidine (ZANTAC) 150 MG tablet       . [DISCONTINUED] atorvastatin (LIPITOR) 40 MG tablet Take 0.5 tablets (20 mg total) by mouth daily.  90 tablet  0  . [DISCONTINUED] atorvastatin (LIPITOR) 40 MG tablet Take 40 mg by mouth daily.      . [DISCONTINUED] FLUoxetine (PROZAC) 10 MG capsule Take 1 capsule (10 mg total) by mouth daily.  90 capsule  3  . [DISCONTINUED] FLUoxetine (PROZAC) 20 MG capsule Take  20 mg by mouth daily.      . diclofenac (CATAFLAM) 50 MG tablet TAKE 1 TABLET EVERY 4 TO 6 HOURS AS NEEDED FOR PAIN  30 tablet  0  . [DISCONTINUED] amoxicillin (AMOXIL) 875 MG tablet Take 1 tablet (875 mg total) by mouth 2 (two) times daily.  28 tablet  14  . [DISCONTINUED] HYDROcodone-homatropine (HYCODAN) 5-1.5 MG/5ML syrup Take 5 mLs by mouth every 8 (eight) hours as needed for cough.  120 mL  0   No facility-administered encounter medications on file as of 09/16/2013.    EXAM:  BP 114/80  Pulse 82  Temp(Src) 97.7 F (36.5 C) (Oral)  Ht 5' 6.75" (1.695 m)  Wt 258 lb (117.028 kg)  BMI 40.73 kg/m2  SpO2 97%  Body mass index is 40.73 kg/(m^2).  Physical Exam: Vital signs reviewed [id number] is a well-developed well-nourished alert cooperative   female who appears her stated age in no acute distress.  HEENT: normocephalic atraumatic , Eyes: PERRL EOM's full, conjunctiva clear, Nares: paten,t no deformity discharge or tenderness.,  Ears: no deformity EAC's clear TMs with normal landmarks. Mouth: clear OP, no lesions, edema.  Moist mucous membranes. Dentition in adequate repair. NECK: supple without masses, thyromegaly or bruits. Breast: normal by inspection . No dimpling, discharge, masses, tenderness or discharge . CHEST/PULM:  Clear to auscultation and percussion breath sounds equal no wheeze , rales or rhonchi. No chest wall deformities or tenderness. CV: PMI is nondisplaced, S1 S2 no gallops, murmurs, rubs. Peripheral pulses are full without delay.No JVD .  ABDOMEN: Bowel sounds normal nontender  No guard or rebound, no hepato splenomegal no CVA tenderness.  No hernia. Extremtities:  No clubbing cyanosis or edema, no acute joint swelling or redness no focal atrophy NEURO:  Oriented x3, cranial nerves 3-12 appear to be intact, no obvious focal weakness,gait within normal limits no abnormal reflexes or asymmetrical SKIN: No acute rashes normal turgor, color, no bruising or petechiae. PSYCH: Oriented, good eye contact, no obvious depression anxiety, cognition and judgment appear normal. LN: no cervical axillary inguinal adenopathy  Lab Results  Component Value Date   WBC 5.3 09/09/2013   HGB 13.4 09/09/2013   HCT 39.8 09/09/2013   PLT 245.0 09/09/2013   GLUCOSE 112* 09/09/2013   CHOL 196 09/09/2013   TRIG 182.0* 09/09/2013   HDL 45.40 09/09/2013   LDLCALC 114* 09/09/2013   ALT 26 09/09/2013   AST 22 09/09/2013   NA 138 09/09/2013   K 4.0 09/09/2013   CL 104 09/09/2013   CREATININE 0.9 09/09/2013   BUN 15 09/09/2013   CO2 27 09/09/2013   TSH 4.90 09/09/2013   HGBA1C 6.2 09/09/2013    ASSESSMENT AND PLAN:  Discussed the following assessment and plan:  Preventative health care  HYPERLIPIDEMIA  Pre-diabetes  Perimenopausal  Need for prophylactic vaccination and inoculation against influenza - Plan: Flu Vaccine QUAD 36+ mos PF IM (Fluarix)  BMI 40.0-44.9, adult  Family history of heart disease in female family member  before age 34  DEPRESSION - stable continue meds  Metabolic issues getting worse with weight gain etc .  Stay on 40 of lipitor  As per protochol  Intensify lifestyle interventions. Weight watcher etc . Counseled regarding healthy nutrition, exercise, sleep, injury prevention, calcium vit d and healthy weight .  Patient Care Team: Madelin Headings, MD as PCP - General Elmon Else, MD (Dermatology) Charna Elizabeth, MD as Consulting Physician (Gastroenterology) Patient Instructions  Intensify  lifestyle intervention healthy eating and exercise .  weight loss.   Will help from getting diabetes. And help snoring and lipids etc .  Weight watchers is a good program as we discussed  No sweet drinks. lipitor 40 mg and fluoxetine 20 to refill.  Labs and recheck in 3-4 months  Or as needed  Preventive Care for Adults, Female A healthy lifestyle and preventive care can promote health and wellness. Preventive health guidelines for women include the following key practices.  A routine yearly physical is a good way to check with your caregiver about your health and preventive screening. It is a chance to share any concerns and updates on your health, and to receive a thorough exam.  Visit your dentist for a routine exam and preventive care every 6 months. Brush your teeth twice a day and floss once a day. Good oral hygiene prevents tooth decay and gum disease.  The frequency of eye exams is based on your age, health, family medical history, use of contact lenses, and other factors. Follow your caregiver's recommendations for frequency of eye exams.  Eat a healthy diet. Foods like vegetables, fruits, whole grains, low-fat dairy products, and lean protein foods contain the nutrients you need without too many calories. Decrease your intake of foods high in solid fats, added sugars, and salt. Eat the right amount of calories for you.Get information about a proper diet from your caregiver, if  necessary.  Regular physical exercise is one of the most important things you can do for your health. Most adults should get at least 150 minutes of moderate-intensity exercise (any activity that increases your heart rate and causes you to sweat) each week. In addition, most adults need muscle-strengthening exercises on 2 or more days a week.  Maintain a healthy weight. The body mass index (BMI) is a screening tool to identify possible weight problems. It provides an estimate of body fat based on height and weight. Your caregiver can help determine your BMI, and can help you achieve or maintain a healthy weight.For adults 20 years and older:  A BMI below 18.5 is considered underweight.  A BMI of 18.5 to 24.9 is normal.  A BMI of 25 to 29.9 is considered overweight.  A BMI of 30 and above is considered obese.  Maintain normal blood lipids and cholesterol levels by exercising and minimizing your intake of saturated fat. Eat a balanced diet with plenty of fruit and vegetables. Blood tests for lipids and cholesterol should begin at age 78 and be repeated every 5 years. If your lipid or cholesterol levels are high, you are over 50, or you are at high risk for heart disease, you may need your cholesterol levels checked more frequently.Ongoing high lipid and cholesterol levels should be treated with medicines if diet and exercise are not effective.  If you smoke, find out from your caregiver how to quit. If you do not use tobacco, do not start.  If you are pregnant, do not drink alcohol. If you are breastfeeding, be very cautious about drinking alcohol. If you are not pregnant and choose to drink alcohol, do not exceed 1 drink per day. One drink is considered to be 12 ounces (355 mL) of beer, 5 ounces (148 mL) of wine, or 1.5 ounces (44 mL) of liquor.  Avoid use of street drugs. Do not share needles with anyone. Ask for help if you need support or instructions about stopping the use of drugs.  High  blood pressure causes heart disease and increases the risk of stroke. Your  blood pressure should be checked at least every 1 to 2 years. Ongoing high blood pressure should be treated with medicines if weight loss and exercise are not effective.  If you are 52 to 49 years old, ask your caregiver if you should take aspirin to prevent strokes.  Diabetes screening involves taking a blood sample to check your fasting blood sugar level. This should be done once every 3 years, after age 53, if you are within normal weight and without risk factors for diabetes. Testing should be considered at a younger age or be carried out more frequently if you are overweight and have at least 1 risk factor for diabetes.  Breast cancer screening is essential preventive care for women. You should practice "breast self-awareness." This means understanding the normal appearance and feel of your breasts and may include breast self-examination. Any changes detected, no matter how small, should be reported to a caregiver. Women in their 59s and 30s should have a clinical breast exam (CBE) by a caregiver as part of a regular health exam every 1 to 3 years. After age 75, women should have a CBE every year. Starting at age 28, women should consider having a mammography (breast X-ray test) every year. Women who have a family history of breast cancer should talk to their caregiver about genetic screening. Women at a high risk of breast cancer should talk to their caregivers about having magnetic resonance imaging (MRI) and a mammography every year.  The Pap test is a screening test for cervical cancer. A Pap test can show cell changes on the cervix that might become cervical cancer if left untreated. A Pap test is a procedure in which cells are obtained and examined from the lower end of the uterus (cervix).  Women should have a Pap test starting at age 49.  Between ages 66 and 55, Pap tests should be repeated every 2 years.  Beginning  at age 2, you should have a Pap test every 3 years as long as the past 3 Pap tests have been normal.  Some women have medical problems that increase the chance of getting cervical cancer. Talk to your caregiver about these problems. It is especially important to talk to your caregiver if a new problem develops soon after your last Pap test. In these cases, your caregiver may recommend more frequent screening and Pap tests.  The above recommendations are the same for women who have or have not gotten the vaccine for human papillomavirus (HPV).  If you had a hysterectomy for a problem that was not cancer or a condition that could lead to cancer, then you no longer need Pap tests. Even if you no longer need a Pap test, a regular exam is a good idea to make sure no other problems are starting.  If you are between ages 3 and 56, and you have had normal Pap tests going back 10 years, you no longer need Pap tests. Even if you no longer need a Pap test, a regular exam is a good idea to make sure no other problems are starting.  If you have had past treatment for cervical cancer or a condition that could lead to cancer, you need Pap tests and screening for cancer for at least 20 years after your treatment.  If Pap tests have been discontinued, risk factors (such as a new sexual partner) need to be reassessed to determine if screening should be resumed.  The HPV test is an additional test that may  be used for cervical cancer screening. The HPV test looks for the virus that can cause the cell changes on the cervix. The cells collected during the Pap test can be tested for HPV. The HPV test could be used to screen women aged 47 years and older, and should be used in women of any age who have unclear Pap test results. After the age of 33, women should have HPV testing at the same frequency as a Pap test.  Colorectal cancer can be detected and often prevented. Most routine colorectal cancer screening begins at  the age of 26 and continues through age 65. However, your caregiver may recommend screening at an earlier age if you have risk factors for colon cancer. On a yearly basis, your caregiver may provide home test kits to check for hidden blood in the stool. Use of a small camera at the end of a tube, to directly examine the colon (sigmoidoscopy or colonoscopy), can detect the earliest forms of colorectal cancer. Talk to your caregiver about this at age 15, when routine screening begins. Direct examination of the colon should be repeated every 5 to 10 years through age 55, unless early forms of pre-cancerous polyps or small growths are found.  Hepatitis C blood testing is recommended for all people born from [birth year] through 1965 and any individual with known risks for hepatitis C.  Practice safe sex. Use condoms and avoid high-risk sexual practices to reduce the spread of sexually transmitted infections (STIs). STIs include gonorrhea, chlamydia, syphilis, trichomonas, herpes, HPV, and human immunodeficiency virus (HIV). Herpes, HIV, and HPV are viral illnesses that have no cure. They can result in disability, cancer, and death. Sexually active women aged 38 and younger should be checked for chlamydia. Older women with new or multiple partners should also be tested for chlamydia. Testing for other STIs is recommended if you are sexually active and at increased risk.  Osteoporosis is a disease in which the bones lose minerals and strength with aging. This can result in serious bone fractures. The risk of osteoporosis can be identified using a bone density scan. Women ages 57 and over and women at risk for fractures or osteoporosis should discuss screening with their caregivers. Ask your caregiver whether you should take a calcium supplement or vitamin D to reduce the rate of osteoporosis.  Menopause can be associated with physical symptoms and risks. Hormone replacement therapy is available to decrease symptoms and  risks. You should talk to your caregiver about whether hormone replacement therapy is right for you.  Use sunscreen with sun protection factor (SPF) of 30 or more. Apply sunscreen liberally and repeatedly throughout the day. You should seek shade when your shadow is shorter than you. Protect yourself by wearing long sleeves, pants, a wide-brimmed hat, and sunglasses year round, whenever you are outdoors.  Once a month, do a whole body skin exam, using a mirror to look at the skin on your back. Notify your caregiver of new moles, moles that have irregular borders, moles that are larger than a pencil eraser, or moles that have changed in shape or color.  Stay current with required immunizations.  Influenza. You need a dose every fall (or winter). The composition of the flu vaccine changes each year, so being vaccinated once is not enough.  Pneumococcal polysaccharide. You need 1 to 2 doses if you smoke cigarettes or if you have certain chronic medical conditions. You need 1 dose at age 53 (or older) if you have never been  vaccinated.  Tetanus, diphtheria, pertussis (Tdap, Td). Get 1 dose of Tdap vaccine if you are younger than age 26, are over 3 and have contact with an infant, are a Research scientist (physical sciences), are pregnant, or simply want to be protected from whooping cough. After that, you need a Td booster dose every 10 years. Consult your caregiver if you have not had at least 3 tetanus and diphtheria-containing shots sometime in your life or have a deep or dirty wound.  HPV. You need this vaccine if you are a woman age 59 or younger. The vaccine is Sharps in 3 doses over 6 months.  Measles, mumps, rubella (MMR). You need at least 1 dose of MMR if you were born in 1957 or later. You may also need a second dose.  Meningococcal. If you are age 31 to 25 and a first-year college student living in a residence hall, or have one of several medical conditions, you need to get vaccinated against meningococcal  disease. You may also need additional booster doses.  Zoster (shingles). If you are age 59 or older, you should get this vaccine.  Varicella (chickenpox). If you have never had chickenpox or you were vaccinated but received only 1 dose, talk to your caregiver to find out if you need this vaccine.  Hepatitis A. You need this vaccine if you have a specific risk factor for hepatitis A virus infection or you simply wish to be protected from this disease. The vaccine is usually Loomer as 2 doses, 6 to 18 months apart.  Hepatitis B. You need this vaccine if you have a specific risk factor for hepatitis B virus infection or you simply wish to be protected from this disease. The vaccine is Lumb in 3 doses, usually over 6 months. Preventive Services / Frequency Ages 54 to 61  Blood pressure check.** / Every 1 to 2 years.  Lipid and cholesterol check.** / Every 5 years beginning at age 46.  Clinical breast exam.** / Every 3 years for women in their 26s and 30s.  Pap test.** / Every 2 years from ages 44 through 36. Every 3 years starting at age 60 through age 43 or 16 with a history of 3 consecutive normal Pap tests.  HPV screening.** / Every 3 years from ages 35 through ages 33 to 93 with a history of 3 consecutive normal Pap tests.  Hepatitis C blood test.** / For any individual with known risks for hepatitis C.  Skin self-exam. / Monthly.  Influenza immunization.** / Every year.  Pneumococcal polysaccharide immunization.** / 1 to 2 doses if you smoke cigarettes or if you have certain chronic medical conditions.  Tetanus, diphtheria, pertussis (Tdap, Td) immunization. / A one-time dose of Tdap vaccine. After that, you need a Td booster dose every 10 years.  HPV immunization. / 3 doses over 6 months, if you are 58 and younger.  Measles, mumps, rubella (MMR) immunization. / You need at least 1 dose of MMR if you were born in 1957 or later. You may also need a second dose.  Meningococcal  immunization. / 1 dose if you are age 28 to 55 and a first-year college student living in a residence hall, or have one of several medical conditions, you need to get vaccinated against meningococcal disease. You may also need additional booster doses.  Varicella immunization.** / Consult your caregiver.  Hepatitis A immunization.** / Consult your caregiver. 2 doses, 6 to 18 months apart.  Hepatitis B immunization.** / Consult your caregiver. 3  doses usually over 6 months. Ages 63 to 52  Blood pressure check.** / Every 1 to 2 years.  Lipid and cholesterol check.** / Every 5 years beginning at age 48.  Clinical breast exam.** / Every year after age 25.  Mammogram.** / Every year beginning at age 29 and continuing for as long as you are in good health. Consult with your caregiver.  Pap test.** / Every 3 years starting at age 79 through age 13 or 22 with a history of 3 consecutive normal Pap tests.  HPV screening.** / Every 3 years from ages 8 through ages 60 to 11 with a history of 3 consecutive normal Pap tests.  Fecal occult blood test (FOBT) of stool. / Every year beginning at age 35 and continuing until age 12. You may not need to do this test if you get a colonoscopy every 10 years.  Flexible sigmoidoscopy or colonoscopy.** / Every 5 years for a flexible sigmoidoscopy or every 10 years for a colonoscopy beginning at age 29 and continuing until age 58.  Hepatitis C blood test.** / For all people born from [birth year] through 1966 and any individual with known risks for hepatitis C.  Skin self-exam. / Monthly.  Influenza immunization.** / Every year.  Pneumococcal polysaccharide immunization.** / 1 to 2 doses if you smoke cigarettes or if you have certain chronic medical conditions.  Tetanus, diphtheria, pertussis (Tdap, Td) immunization.** / A one-time dose of Tdap vaccine. After that, you need a Td booster dose every 10 years.  Measles, mumps, rubella (MMR) immunization. / You need at  least 1 dose of MMR if you were born in 1957 or later. You may also need a second dose.  Varicella immunization.** / Consult your caregiver.  Meningococcal immunization.** / Consult your caregiver.  Hepatitis A immunization.** / Consult your caregiver. 2 doses, 6 to 18 months apart.  Hepatitis B immunization.** / Consult your caregiver. 3 doses, usually over 6 months. Ages 49 and over  Blood pressure check.** / Every 1 to 2 years.  Lipid and cholesterol check.** / Every 5 years beginning at age 2.  Clinical breast exam.** / Every year after age 62.  Mammogram.** / Every year beginning at age 21 and continuing for as long as you are in good health. Consult with your caregiver.  Pap test.** / Every 3 years starting at age 34 through age 59 or 81 with a 3 consecutive normal Pap tests. Testing can be stopped between 65 and 70 with 3 consecutive normal Pap tests and no abnormal Pap or HPV tests in the past 10 years.  HPV screening.** / Every 3 years from ages 53 through ages 76 or 48 with a history of 3 consecutive normal Pap tests. Testing can be stopped between 65 and 70 with 3 consecutive normal Pap tests and no abnormal Pap or HPV tests in the past 10 years.  Fecal occult blood test (FOBT) of stool. / Every year beginning at age 45 and continuing until age 12. You may not need to do this test if you get a colonoscopy every 10 years.  Flexible sigmoidoscopy or colonoscopy.** / Every 5 years for a flexible sigmoidoscopy or every 10 years for a colonoscopy beginning at age 31 and continuing until age 63.  Hepatitis C blood test.** / For all people born from [birth year] through 1966 and any individual with known risks for hepatitis C.  Osteoporosis screening.** / A one-time screening for women ages 90 and over and women at risk for  fractures or osteoporosis.  Skin self-exam. / Monthly.  Influenza immunization.** / Every year.  Pneumococcal polysaccharide immunization.** / 1 dose at age 53 (or  older) if you have never been vaccinated.  Tetanus, diphtheria, pertussis (Tdap, Td) immunization. / A one-time dose of Tdap vaccine if you are over 65 and have contact with an infant, are a Research scientist (physical sciences), or simply want to be protected from whooping cough. After that, you need a Td booster dose every 10 years.  Varicella immunization.** / Consult your caregiver.  Meningococcal immunization.** / Consult your caregiver.  Hepatitis A immunization.** / Consult your caregiver. 2 doses, 6 to 18 months apart.  Hepatitis B immunization.** / Check with your caregiver. 3 doses, usually over 6 months. ** Family history and personal history of risk and conditions may change your caregiver's recommendations. Document Released: 02/12/2002 Document Revised: 03/10/2012 Document Reviewed: 05/14/2011 [facility] Patient Information 2014 [place], [place].     [place] Mends. Panosh M.D. Health Maintenance  Topic Date Due  . Influenza Vaccine  07/31/2014  . Pap Smear  06/12/2015  . Tetanus/tdap  06/11/2022   Health Maintenance Review

## 2013-09-16 NOTE — Patient Instructions (Addendum)
Intensify  lifestyle intervention healthy eating and exercise . weight loss.   Will help from getting diabetes. And help snoring and lipids etc .  Weight watchers is a good program as we discussed  No sweet drinks. lipitor 40 mg and fluoxetine 20 to refill.  Labs and recheck in 3-4 months  Or as needed  Preventive Care for Adults, Female A healthy lifestyle and preventive care can promote health and wellness. Preventive health guidelines for women include the following key practices.  A routine yearly physical is a good way to check with your caregiver about your health and preventive screening. It is a chance to share any concerns and updates on your health, and to receive a thorough exam.  Visit your dentist for a routine exam and preventive care every 6 months. Brush your teeth twice a day and floss once a day. Good oral hygiene prevents tooth decay and gum disease.  The frequency of eye exams is based on your age, health, family medical history, use of contact lenses, and other factors. Follow your caregiver's recommendations for frequency of eye exams.  Eat a healthy diet. Foods like vegetables, fruits, whole grains, low-fat dairy products, and lean protein foods contain the nutrients you need without too many calories. Decrease your intake of foods high in solid fats, added sugars, and salt. Eat the right amount of calories for you.Get information about a proper diet from your caregiver, if necessary.  Regular physical exercise is one of the most important things you can do for your health. Most adults should get at least 150 minutes of moderate-intensity exercise (any activity that increases your heart rate and causes you to sweat) each week. In addition, most adults need muscle-strengthening exercises on 2 or more days a week.  Maintain a healthy weight. The body mass index (BMI) is a screening tool to identify possible weight problems. It provides an estimate of body fat based on  height and weight. Your caregiver can help determine your BMI, and can help you achieve or maintain a healthy weight.For adults 20 years and older:  A BMI below 18.5 is considered underweight.  A BMI of 18.5 to 24.9 is normal.  A BMI of 25 to 29.9 is considered overweight.  A BMI of 30 and above is considered obese.  Maintain normal blood lipids and cholesterol levels by exercising and minimizing your intake of saturated fat. Eat a balanced diet with plenty of fruit and vegetables. Blood tests for lipids and cholesterol should begin at age 38 and be repeated every 5 years. If your lipid or cholesterol levels are high, you are over 50, or you are at high risk for heart disease, you may need your cholesterol levels checked more frequently.Ongoing high lipid and cholesterol levels should be treated with medicines if diet and exercise are not effective.  If you smoke, find out from your caregiver how to quit. If you do not use tobacco, do not start.  If you are pregnant, do not drink alcohol. If you are breastfeeding, be very cautious about drinking alcohol. If you are not pregnant and choose to drink alcohol, do not exceed 1 drink per day. One drink is considered to be 12 ounces (355 mL) of beer, 5 ounces (148 mL) of wine, or 1.5 ounces (44 mL) of liquor.  Avoid use of street drugs. Do not share needles with anyone. Ask for help if you need support or instructions about stopping the use of drugs.  High blood pressure causes heart  disease and increases the risk of stroke. Your blood pressure should be checked at least every 1 to 2 years. Ongoing high blood pressure should be treated with medicines if weight loss and exercise are not effective.  If you are 35 to 49 years old, ask your caregiver if you should take aspirin to prevent strokes.  Diabetes screening involves taking a blood sample to check your fasting blood sugar level. This should be done once every 3 years, after age 43, if you are  within normal weight and without risk factors for diabetes. Testing should be considered at a younger age or be carried out more frequently if you are overweight and have at least 1 risk factor for diabetes.  Breast cancer screening is essential preventive care for women. You should practice "breast self-awareness." This means understanding the normal appearance and feel of your breasts and may include breast self-examination. Any changes detected, no matter how small, should be reported to a caregiver. Women in their 38s and 30s should have a clinical breast exam (CBE) by a caregiver as part of a regular health exam every 1 to 3 years. After age 96, women should have a CBE every year. Starting at age 35, women should consider having a mammography (breast X-ray test) every year. Women who have a family history of breast cancer should talk to their caregiver about genetic screening. Women at a high risk of breast cancer should talk to their caregivers about having magnetic resonance imaging (MRI) and a mammography every year.  The Pap test is a screening test for cervical cancer. A Pap test can show cell changes on the cervix that might become cervical cancer if left untreated. A Pap test is a procedure in which cells are obtained and examined from the lower end of the uterus (cervix).  Women should have a Pap test starting at age 11.  Between ages 31 and 25, Pap tests should be repeated every 2 years.  Beginning at age 73, you should have a Pap test every 3 years as long as the past 3 Pap tests have been normal.  Some women have medical problems that increase the chance of getting cervical cancer. Talk to your caregiver about these problems. It is especially important to talk to your caregiver if a new problem develops soon after your last Pap test. In these cases, your caregiver may recommend more frequent screening and Pap tests.  The above recommendations are the same for women who have or have not  gotten the vaccine for human papillomavirus (HPV).  If you had a hysterectomy for a problem that was not cancer or a condition that could lead to cancer, then you no longer need Pap tests. Even if you no longer need a Pap test, a regular exam is a good idea to make sure no other problems are starting.  If you are between ages 20 and 34, and you have had normal Pap tests going back 10 years, you no longer need Pap tests. Even if you no longer need a Pap test, a regular exam is a good idea to make sure no other problems are starting.  If you have had past treatment for cervical cancer or a condition that could lead to cancer, you need Pap tests and screening for cancer for at least 20 years after your treatment.  If Pap tests have been discontinued, risk factors (such as a new sexual partner) need to be reassessed to determine if screening should be resumed.  The HPV test is an additional test that may be used for cervical cancer screening. The HPV test looks for the virus that can cause the cell changes on the cervix. The cells collected during the Pap test can be tested for HPV. The HPV test could be used to screen women aged 40 years and older, and should be used in women of any age who have unclear Pap test results. After the age of 25, women should have HPV testing at the same frequency as a Pap test.  Colorectal cancer can be detected and often prevented. Most routine colorectal cancer screening begins at the age of 72 and continues through age 17. However, your caregiver may recommend screening at an earlier age if you have risk factors for colon cancer. On a yearly basis, your caregiver may provide home test kits to check for hidden blood in the stool. Use of a small camera at the end of a tube, to directly examine the colon (sigmoidoscopy or colonoscopy), can detect the earliest forms of colorectal cancer. Talk to your caregiver about this at age 6, when routine screening begins. Direct  examination of the colon should be repeated every 5 to 10 years through age 77, unless early forms of pre-cancerous polyps or small growths are found.  Hepatitis C blood testing is recommended for all people born from [birth year] through 1965 and any individual with known risks for hepatitis C.  Practice safe sex. Use condoms and avoid high-risk sexual practices to reduce the spread of sexually transmitted infections (STIs). STIs include gonorrhea, chlamydia, syphilis, trichomonas, herpes, HPV, and human immunodeficiency virus (HIV). Herpes, HIV, and HPV are viral illnesses that have no cure. They can result in disability, cancer, and death. Sexually active women aged 87 and younger should be checked for chlamydia. Older women with new or multiple partners should also be tested for chlamydia. Testing for other STIs is recommended if you are sexually active and at increased risk.  Osteoporosis is a disease in which the bones lose minerals and strength with aging. This can result in serious bone fractures. The risk of osteoporosis can be identified using a bone density scan. Women ages 77 and over and women at risk for fractures or osteoporosis should discuss screening with their caregivers. Ask your caregiver whether you should take a calcium supplement or vitamin D to reduce the rate of osteoporosis.  Menopause can be associated with physical symptoms and risks. Hormone replacement therapy is available to decrease symptoms and risks. You should talk to your caregiver about whether hormone replacement therapy is right for you.  Use sunscreen with sun protection factor (SPF) of 30 or more. Apply sunscreen liberally and repeatedly throughout the day. You should seek shade when your shadow is shorter than you. Protect yourself by wearing long sleeves, pants, a wide-brimmed hat, and sunglasses year round, whenever you are outdoors.  Once a month, do a whole body skin exam, using a mirror to look at the skin on your  back. Notify your caregiver of new moles, moles that have irregular borders, moles that are larger than a pencil eraser, or moles that have changed in shape or color.  Stay current with required immunizations.  Influenza. You need a dose every fall (or winter). The composition of the flu vaccine changes each year, so being vaccinated once is not enough.  Pneumococcal polysaccharide. You need 1 to 2 doses if you smoke cigarettes or if you have certain chronic medical conditions. You need 1 dose at  age 74 (or older) if you have never been vaccinated.  Tetanus, diphtheria, pertussis (Tdap, Td). Get 1 dose of Tdap vaccine if you are younger than age 46, are over 54 and have contact with an infant, are a Research scientist (physical sciences), are pregnant, or simply want to be protected from whooping cough. After that, you need a Td booster dose every 10 years. Consult your caregiver if you have not had at least 3 tetanus and diphtheria-containing shots sometime in your life or have a deep or dirty wound.  HPV. You need this vaccine if you are a woman age 26 or younger. The vaccine is [place] in 3 doses over 6 months.  Measles, mumps, rubella (MMR). You need at least 1 dose of MMR if you were born in 1957 or later. You may also need a second dose.  Meningococcal. If you are age 67 to 13 and a first-year college student living in a residence hall, or have one of several medical conditions, you need to get vaccinated against meningococcal disease. You may also need additional booster doses.  Zoster (shingles). If you are age 60 or older, you should get this vaccine.  Varicella (chickenpox). If you have never had chickenpox or you were vaccinated but received only 1 dose, talk to your caregiver to find out if you need this vaccine.  Hepatitis A. You need this vaccine if you have a specific risk factor for hepatitis A virus infection or you simply wish to be protected from this disease. The vaccine is usually [place] as 2 doses,  6 to 18 months apart.  Hepatitis B. You need this vaccine if you have a specific risk factor for hepatitis B virus infection or you simply wish to be protected from this disease. The vaccine is [place] in 3 doses, usually over 6 months. Preventive Services / Frequency Ages 48 to 53  Blood pressure check.** / Every 1 to 2 years.  Lipid and cholesterol check.** / Every 5 years beginning at age 54.  Clinical breast exam.** / Every 3 years for women in their 37s and 30s.  Pap test.** / Every 2 years from ages 29 through 70. Every 3 years starting at age 41 through age 23 or 60 with a history of 3 consecutive normal Pap tests.  HPV screening.** / Every 3 years from ages 42 through ages 70 to 86 with a history of 3 consecutive normal Pap tests.  Hepatitis C blood test.** / For any individual with known risks for hepatitis C.  Skin self-exam. / Monthly.  Influenza immunization.** / Every year.  Pneumococcal polysaccharide immunization.** / 1 to 2 doses if you smoke cigarettes or if you have certain chronic medical conditions.  Tetanus, diphtheria, pertussis (Tdap, Td) immunization. / A one-time dose of Tdap vaccine. After that, you need a Td booster dose every 10 years.  HPV immunization. / 3 doses over 6 months, if you are 36 and younger.  Measles, mumps, rubella (MMR) immunization. / You need at least 1 dose of MMR if you were born in 1957 or later. You may also need a second dose.  Meningococcal immunization. / 1 dose if you are age 32 to 20 and a first-year college student living in a residence hall, or have one of several medical conditions, you need to get vaccinated against meningococcal disease. You may also need additional booster doses.  Varicella immunization.** / Consult your caregiver.  Hepatitis A immunization.** / Consult your caregiver. 2 doses, 6 to 18 months apart.  Hepatitis B immunization.** / Consult your caregiver. 3 doses usually over 6 months. Ages 34 to  84  Blood pressure check.** / Every 1 to 2 years.  Lipid and cholesterol check.** / Every 5 years beginning at age 26.  Clinical breast exam.** / Every year after age 57.  Mammogram.** / Every year beginning at age 4 and continuing for as long as you are in good health. Consult with your caregiver.  Pap test.** / Every 3 years starting at age 65 through age 74 or 40 with a history of 3 consecutive normal Pap tests.  HPV screening.** / Every 3 years from ages 60 through ages 24 to 11 with a history of 3 consecutive normal Pap tests.  Fecal occult blood test (FOBT) of stool. / Every year beginning at age 63 and continuing until age 27. You may not need to do this test if you get a colonoscopy every 10 years.  Flexible sigmoidoscopy or colonoscopy.** / Every 5 years for a flexible sigmoidoscopy or every 10 years for a colonoscopy beginning at age 43 and continuing until age 98.  Hepatitis C blood test.** / For all people born from [birth year] through 1965 and any individual with known risks for hepatitis C.  Skin self-exam. / Monthly.  Influenza immunization.** / Every year.  Pneumococcal polysaccharide immunization.** / 1 to 2 doses if you smoke cigarettes or if you have certain chronic medical conditions.  Tetanus, diphtheria, pertussis (Tdap, Td) immunization.** / A one-time dose of Tdap vaccine. After that, you need a Td booster dose every 10 years.  Measles, mumps, rubella (MMR) immunization. / You need at least 1 dose of MMR if you were born in 1957 or later. You may also need a second dose.  Varicella immunization.** / Consult your caregiver.  Meningococcal immunization.** / Consult your caregiver.  Hepatitis A immunization.** / Consult your caregiver. 2 doses, 6 to 18 months apart.  Hepatitis B immunization.** / Consult your caregiver. 3 doses, usually over 6 months. Ages 64 and over  Blood pressure check.** / Every 1 to 2 years.  Lipid and cholesterol check.** / Every 5 years  beginning at age 51.  Clinical breast exam.** / Every year after age 57.  Mammogram.** / Every year beginning at age 68 and continuing for as long as you are in good health. Consult with your caregiver.  Pap test.** / Every 3 years starting at age 39 through age 53 or 54 with a 3 consecutive normal Pap tests. Testing can be stopped between 65 and 70 with 3 consecutive normal Pap tests and no abnormal Pap or HPV tests in the past 10 years.  HPV screening.** / Every 3 years from ages 28 through ages 48 or 38 with a history of 3 consecutive normal Pap tests. Testing can be stopped between 65 and 70 with 3 consecutive normal Pap tests and no abnormal Pap or HPV tests in the past 10 years.  Fecal occult blood test (FOBT) of stool. / Every year beginning at age 78 and continuing until age 46. You may not need to do this test if you get a colonoscopy every 10 years.  Flexible sigmoidoscopy or colonoscopy.** / Every 5 years for a flexible sigmoidoscopy or every 10 years for a colonoscopy beginning at age 10 and continuing until age 66.  Hepatitis C blood test.** / For all people born from [birth year] through 1966 and any individual with known risks for hepatitis C.  Osteoporosis screening.** / A one-time screening for women  ages 39 and over and women at risk for fractures or osteoporosis.  Skin self-exam. / Monthly.  Influenza immunization.** / Every year.  Pneumococcal polysaccharide immunization.** / 1 dose at age 64 (or older) if you have never been vaccinated.  Tetanus, diphtheria, pertussis (Tdap, Td) immunization. / A one-time dose of Tdap vaccine if you are over 65 and have contact with an infant, are a Research scientist (physical sciences), or simply want to be protected from whooping cough. After that, you need a Td booster dose every 10 years.  Varicella immunization.** / Consult your caregiver.  Meningococcal immunization.** / Consult your caregiver.  Hepatitis A immunization.** / Consult your caregiver. 2  doses, 6 to 18 months apart.  Hepatitis B immunization.** / Check with your caregiver. 3 doses, usually over 6 months. ** Family history and personal history of risk and conditions may change your caregiver's recommendations. Document Released: 02/12/2002 Document Revised: 03/10/2012 Document Reviewed: 05/14/2011 [facility] Patient Information 2014 [place], [place].

## 2013-09-19 ENCOUNTER — Encounter: Payer: Self-pay | Admitting: Internal Medicine

## 2013-09-19 DIAGNOSIS — Z6841 Body Mass Index (BMI) 40.0 and over, adult: Secondary | ICD-10-CM | POA: Insufficient documentation

## 2013-10-09 ENCOUNTER — Ambulatory Visit
Admission: RE | Admit: 2013-10-09 | Discharge: 2013-10-09 | Disposition: A | Discharge status: Home / Self Care | Payer: BC Managed Care – HMO | Source: Ambulatory Visit

## 2013-10-09 DIAGNOSIS — Z1231 Encounter for screening mammogram for malignant neoplasm of breast: Secondary | ICD-10-CM

## 2013-10-14 ENCOUNTER — Ambulatory Visit (INDEPENDENT_AMBULATORY_CARE_PROVIDER_SITE_OTHER): Payer: Managed Care, Other (non HMO) | Admitting: Internal Medicine

## 2013-10-14 ENCOUNTER — Encounter: Payer: Self-pay | Admitting: Internal Medicine

## 2013-10-14 VITALS — BP 122/82 | HR 91 | Temp 98.1°F | Wt 260.0 lb

## 2013-10-14 DIAGNOSIS — J309 Allergic rhinitis, unspecified: Secondary | ICD-10-CM

## 2013-10-14 DIAGNOSIS — J019 Acute sinusitis, unspecified: Secondary | ICD-10-CM

## 2013-10-14 MED ORDER — AMOXICILLIN 500 MG PO CAPS: 500.0000 mg | ORAL_CAPSULE | Freq: Three times a day (TID) | ORAL | Status: DC

## 2013-10-14 MED ORDER — FLUTICASONE PROPIONATE 50 MCG/ACT NA SUSP: NASAL | Status: DC

## 2013-10-14 NOTE — Patient Instructions (Signed)

## 2013-10-14 NOTE — Progress Notes (Signed)
Chief Complaint  Patient presents with  . Cough    Cough is productive of a yellow/green sputum. Ears feel "stuffy and clogged." Started 3 weeks ago.  Marland Kitchen Headache    HPI: Patient comes in today for SDA for  new problem evaluation.  Onset 3 weeks ago  Poss allergies but now getting worse with colored green .  Face pressure and some HA no fever.   No antibiotic use.  Recently  ;May have underlying allergy   Cough but no sob hemoptysis   Using otc  ROS: See pertinent positives and negatives per HPI.  Past Medical History  Diagnosis Date  . Hx of abnormal cervical Pap smear     used cryo when first married and pregnant  . Sinusitis   . Hyperlipidemia   . Allergic rhinitis   . Depression   . UTI (lower urinary tract infection)   . Hyperglycemia     Family History  Problem Relation Age of Onset  . Heart attack Brother     age 13  . Diabetes type II    . Hyperlipidemia    . Factor V Leiden deficiency Daughter     Also husband    History   Social History  . Marital Status: Married    Spouse Name: N/A    Number of Children: N/A  . Years of Education: N/A   Social History Main Topics  . Smoking status: Former Games developer  . Smokeless tobacco: None  . Alcohol Use: 0.6 oz/week    1 Glasses of wine per week  . Drug Use: No  . Sexual Activity:    Other Topics Concern  . 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 WDWN in NAD  quiet respirations; mederaly  congested  somewhat hoarse. Non toxic . HEENT: Normocephalic ;atraumatic , Eyes;  PERRL, EOMs  Full, lids and conjunctiva clear,,Ears: no deformities, canals nl, TM landmarks normal, Nose: no deformity or discharge but congested;face  minimally tender Mouth : OP clear without lesion or edema . pnd  Neck: Supple without adenopathy or masses or bruits Chest:  Clear to A without wheezes rales or rhonchi CV:  S1-S2 no gallops or murmurs peripheral perfusion is normal  ASSESSMENT AND PLAN:  Discussed the following assessment and plan:  Acute sinusitis with symptoms greater than 10 days  Allergic rhinitis amox tid [place] flonase  Saline etc  -Patient advised to return or notify health care team  if symptoms worsen or persist or new concerns arise. Counseled.  Neta Mends. Panosh M.D.

## 2013-10-22 ENCOUNTER — Telehealth: Payer: Self-pay | Admitting: Internal Medicine

## 2013-10-22 NOTE — Telephone Encounter (Signed)
Change to augmentin 875 1 po bid for 10 days

## 2013-10-22 NOTE — Telephone Encounter (Signed)
Pt has only 2 days left of amoxicillin (AMOXIL) 500 MG capsule Pt still has yellow phlem coming up and doesn't feel like this med is strong enough. Sinus still clogged. Would like another antibiotic. [facility]

## 2013-10-23 NOTE — Telephone Encounter (Signed)
Pt is calling back regarding refill of antibiotic.  She was seen in the office on 10/15 and prescribed Amoxil but feels that she is not better and she is completing the dose. Pt called yesterday but has not heard back.   Per EPIC,  Madelin Headings, MD at 10/22/2013 12:23 PM   Status: Signed            Change to augmentin 875 1 po bid for 10 days    [facility] [address] [phone] and left message on pharmacy voicemail

## 2013-11-03 ENCOUNTER — Telehealth: Payer: Self-pay | Admitting: Internal Medicine

## 2013-11-03 NOTE — Telephone Encounter (Signed)
Per WP, pt should be seen in the office.  Called her and made an appt for 11/04/13 @ 3pm.

## 2013-11-03 NOTE — Telephone Encounter (Signed)
Patient Information:  Caller Name: Zenna  Phone: [phone]  Patient: Victoria Carter, Victoria Carter  Gender: Female  DOB: [birth year]  Age: 48 Years  PCP: Berniece Andreas (Family Practice)  Pregnant: No  Office Follow Up:  Does the office need to follow up with this patient?: Yes  Instructions For The Office: Patient wonders if she needs to have an X-Ray or other f/u or if Dr. needs to prescribe a 3rd abx and requests that somenone call her back with Dr. Rosezella [place] instructions.  RN Note:  Patient wonders if she needs to have an X-Ray or other f/u or if Dr. needs to prescribe a 3rd abx and requests that somenone call her back with Dr. Rosezella Florida instructions.  Symptoms  Reason For Call & Symptoms: Onset 1.5 months ago of sinus infection, saw Dr. Fabian Sharp on 10/14/2013.  Saw Dr. Fabian Sharp again on 10/23/2013, [place] 2nd  abx, and finished on 11/01/2013.  Still has congestion and coughing up yellow sputum.  Reviewed Health History In EMR: Yes  Reviewed Medications In EMR: Yes  Reviewed Allergies In EMR: Yes  Reviewed Surgeries / Procedures: Yes  Date of Onset of Symptoms: 09/14/2013  Treatments Tried: Amoxicillin 1st, Augmentin 2nd  Treatments Tried Worked: Yes OB / GYN:  LMP: Unknown  Guideline(s) Used:  Sinus Pain and Congestion  Disposition Per Guideline:   See Today or Tomorrow in Office  Reason For Disposition Reached:   Sinus congestion (pressure, fullness) present > 10 days  Advice [place]:  Reassurance:   Sinus congestion is a normal part of a cold.  Usually home treatment with nasal washes can prevent an actual bacterial sinus infection.  Antibiotics are not helpful for the sinus congestion that occurs with colds.  Here is some care advice that should help.  Pain and Fever Medicines:  Ibuprofen (e.g., Motrin, Advil):  Take 400 mg (two 200 mg pills) by mouth every 6 hours.  Another choice is to take 600 mg (three 200 mg pills) by mouth every 8 hours.  The most you should take each day is  1,200 mg (six 200 mg pills), unless your doctor has told you to take more.  Hydration:  Drink plenty of liquids (6-8 glasses of water daily). If the air in your home is dry, use a cool mist humidifier  Expected Course:  Sinus congestion from viral upper respiratory infections (colds) usually lasts 5-10 days.  Occasionally a cold can worsen and turn into bacterial sinusitis. Clues to this are sinus symptoms lasting longer than 10 days, fever lasting longer than 3 days, and worsening pain. Bacterial sinusitis may need antibiotic treatment.  Call Back If:   Severe pain lasts longer than 2 hours after pain medicine  Sinus pain lasts longer than 1 day after starting treatment using nasal washes  Sinus congestion (fullness) lasts longer than 10 days  Fever lasts longer than 3 days  You become worse.  RN Overrode Recommendation:  Follow Up With Office Later  Patient wonders if she needs to have an X-Ray or other f/u or if Dr. needs to prescribe a 3rd abx and requests that someone call her back with Dr. Rosezella Florida instructions.

## 2013-11-04 ENCOUNTER — Encounter: Payer: Self-pay | Admitting: Internal Medicine

## 2013-11-04 ENCOUNTER — Ambulatory Visit (INDEPENDENT_AMBULATORY_CARE_PROVIDER_SITE_OTHER): Payer: Managed Care, Other (non HMO) | Admitting: Internal Medicine

## 2013-11-04 VITALS — BP 110/80 | HR 86 | Temp 98.3°F | Wt 256.0 lb

## 2013-11-04 DIAGNOSIS — N760 Acute vaginitis: Secondary | ICD-10-CM

## 2013-11-04 DIAGNOSIS — J019 Acute sinusitis, unspecified: Secondary | ICD-10-CM

## 2013-11-04 HISTORY — DX: Acute vaginitis: N76.0

## 2013-11-04 MED ORDER — PREDNISONE 20 MG PO TABS: ORAL_TABLET | ORAL | Status: DC

## 2013-11-04 MED ORDER — MICONAZOLE NITRATE 100 MG VA SUPP: 100.0000 mg | Freq: Every day | VAGINAL | Status: DC

## 2013-11-04 MED ORDER — FLUCONAZOLE 150 MG PO TABS: 150.0000 mg | ORAL_TABLET | Freq: Once | ORAL | Status: DC

## 2013-11-04 MED ORDER — LEVOFLOXACIN 750 MG PO TABS: 750.0000 mg | ORAL_TABLET | Freq: Every day | ORAL | Status: DC

## 2013-11-04 NOTE — Patient Instructions (Signed)
Broad spectrum antibiotic and prednisone to decrease swelling in sinuses.  otc monistat if needed for yeast can add diflucan but hold the lipitor to avoid side effects.   Contact us   Monday either way about how ytou are doing. To decide on fu.

## 2013-11-04 NOTE — Progress Notes (Signed)
Chief Complaint  Patient presents with  . Follow-up    HPI: Fu of sinusitis rx with amox and   augmentin but still not better . See last notes and PHONE notes . amox no help and then augmented  a bit better .  Still hacking off meds.  No fever very congested with cheek pressure ROS: See pertinent positives and negatives per HPI. No fever chills   hemoptysis could be getting yeast infection rx for otc or other for flex   Past Medical History  Diagnosis Date  . Hx of abnormal cervical Pap smear     used cryo when first married and pregnant  . Sinusitis   . Hyperlipidemia   . Allergic rhinitis   . Depression   . UTI (lower urinary tract infection)   . Hyperglycemia     Family History  Problem Relation Age of Onset  . Heart attack Brother     age 27  . Diabetes type II    . Hyperlipidemia    . Factor V Leiden deficiency Daughter     Also husband    History   Social History  . Marital Status: Married    Spouse Name: N/A    Number of Children: N/A  . Years of Education: N/A   Social History Main Topics  . Smoking status: Former Games developer  . Smokeless tobacco: None  . Alcohol Use: 0.6 oz/week    1 Glasses of wine per week  . Drug Use: No  . Sexual Activity:    Other Topics Concern  . None   Social History Narrative   Occupation: 40 per week 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    Married   Regular exercise- yes   HH of 2-  girls home from college   Pet dogs   G2P2    Outpatient Encounter Prescriptions as of 11/04/2013  Medication Sig  . atorvastatin (LIPITOR) 40 MG tablet Take 1 tablet (40 mg total) by mouth daily.  . calcium-vitamin D (OSCAL WITH D) 250-125 MG-UNIT per tablet Take 2 tablets by mouth daily.    . diclofenac (CATAFLAM) 50 MG tablet TAKE 1 TABLET EVERY 4 TO 6 HOURS AS NEEDED FOR PAIN  . FLUoxetine (PROZAC) 20 MG capsule Take 1 capsule (20 mg total) by mouth daily.  . fluticasone (FLONASE) 50 MCG/ACT nasal spray 2 spray each nostril qd  . MULTIPLE VITAMIN PO Take by  mouth.    . ranitidine (ZANTAC) 150 MG tablet   . fluconazole (DIFLUCAN) 150 MG tablet Take 1 tablet (150 mg total) by mouth once.  Marland Kitchen levofloxacin (LEVAQUIN) 750 MG tablet Take 1 tablet (750 mg total) by mouth daily.  . miconazole (MICONAZOLE 7) 100 MG vaginal suppository Place 1 suppository (100 mg total) vaginally at bedtime.  . predniSONE (DELTASONE) 20 MG tablet Take 3 po qd for 2 days then 2 po qd for 3 days,or as directed  . [DISCONTINUED] amoxicillin (AMOXIL) 500 MG capsule Take 1 capsule (500 mg total) by mouth 3 (three) times daily.    EXAM:  BP 110/80  Pulse 86  Temp(Src) 98.3 F (36.8 C) (Oral)  Wt 256 lb (116.121 kg)  SpO2 98%  Body mass index is 40.42 kg/(m^2).  GENERAL: vitals reviewed and listed above, alert, oriented, appears well hydrated and in no acute distress very congested non toxic   HEENT: atraumatic, conjunctiva  clear, no obvious abnormalities on inspection of external nose and ears tms nl nares 3+ turbinates  Face mild tenderness maxilla ?  Frontal  OP : no lesion edema or exudate   NECK: no obvious masses on inspection palpation  No adenopathy  LUNGS: clear to auscultation bilaterally, no wheezes, rales or rhonchi, good air movement CV: HRRR, no clubbing cyanosis or  peripheral edema nl cap refill  MS: moves all extremities without noticeable focal  abnormality PSYCH: pleasant and cooperative, no obvious depression or anxiety  ASSESSMENT AND PLAN:  Discussed the following assessment and plan:  Acute sinusitis treated with antibiotics in the past 60 days - persistenet some impr with aug   levaquin and pred 3-5 days consider ct if not better   Vaginitis and vulvovaginitis - poss early yeast  . rx disc topical otc first and add diflucan if needed Drug IA discussed   -Patient advised to return or notify health care team  if symptoms worsen or persist or new concerns arise.  Patient Instructions  Broad spectrum antibiotic and prednisone to decrease  swelling in sinuses.  otc monistat if needed for yeast can add diflucan but hold the lipitor to avoid side effects.   Contact us   Monday either way about how ytou are doing. To decide on fu.    Neta Mends. Panosh M.D.

## 2013-11-09 ENCOUNTER — Telehealth: Payer: Self-pay | Admitting: Family Medicine

## 2013-11-09 NOTE — Telephone Encounter (Signed)
Pt called and left a message on my machine.  She stated she still has 2 days of antibiotics left.  She continues to have a yellow nasal drainage and ear pain.  Otherwise, feels better.  She mentioned a sinus CT.  Please advise.  Thanks!

## 2013-11-09 NOTE — Telephone Encounter (Signed)
Call patient   If not  Almost better on wed Thursday we should order  Sinus ct scan at that time before the  Next  weekend

## 2013-11-11 ENCOUNTER — Other Ambulatory Visit: Payer: Self-pay | Admitting: Internal Medicine

## 2013-11-11 NOTE — Telephone Encounter (Signed)
Today is Wednesday.  Called pt to see if she is better.  Left message on home/cell for the pt to return my call.

## 2013-11-11 NOTE — Telephone Encounter (Signed)
Spoke to the pt.  She continues to have nasal congestion.  Yesterday was yellow and today only clear.  Continues to be very thick.  Pt fills like she is gagging.  Ear continue to pop and click.  Please advise if pt needs CT Sinus.  Thanks!

## 2013-11-11 NOTE — Telephone Encounter (Signed)
Please order a sinus ct scan to be done this week today or tomorrow

## 2013-11-12 ENCOUNTER — Other Ambulatory Visit: Payer: Self-pay | Admitting: Family Medicine

## 2013-11-12 DIAGNOSIS — J019 Acute sinusitis, unspecified: Secondary | ICD-10-CM

## 2013-11-12 NOTE — Telephone Encounter (Signed)
Patient notified

## 2013-11-12 NOTE — Telephone Encounter (Signed)
Order placed in the system. 

## 2013-11-13 ENCOUNTER — Other Ambulatory Visit: Payer: Self-pay | Admitting: Family Medicine

## 2013-11-13 DIAGNOSIS — J019 Acute sinusitis, unspecified: Secondary | ICD-10-CM

## 2013-11-16 ENCOUNTER — Ambulatory Visit (INDEPENDENT_AMBULATORY_CARE_PROVIDER_SITE_OTHER)
Admission: RE | Admit: 2013-11-16 | Discharge: 2013-11-16 | Disposition: A | Discharge status: Home / Self Care | Payer: Managed Care, Other (non HMO) | Source: Ambulatory Visit | Attending: Internal Medicine | Admitting: Internal Medicine

## 2013-11-16 DIAGNOSIS — J019 Acute sinusitis, unspecified: Secondary | ICD-10-CM

## 2013-11-16 IMAGING — CT CT PARANASAL SINUSES LIMITED
1 series · 13 of 15 positions shown, 17 images · non-contrast
Comparison: None.

CLINICAL DATA: Sinus symptoms persist after antibiotics.

EXAM:
CT PARANASAL SINUS LIMITED WITHOUT CONTRAST
TECHNIQUE: Non-contiguous multidetector CT images of the paranasal sinuses were
obtained in a single plane without contrast.

[Series 4: ltd sinus sup 3.0 h30s · axial · 0.34mm/px · z∈[-200,-120]mm · 13 of 15 slices shown, 17 images]
[im 2/15  brain]
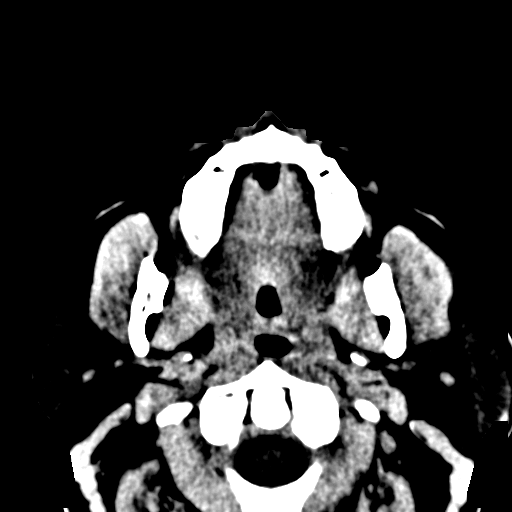
[im 2/15  bone]
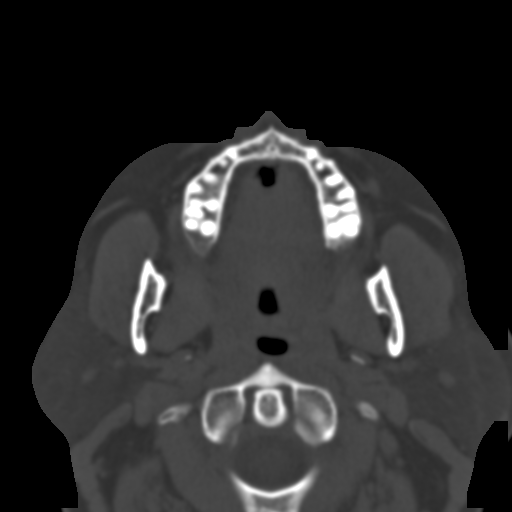
[im 3/15  bone]
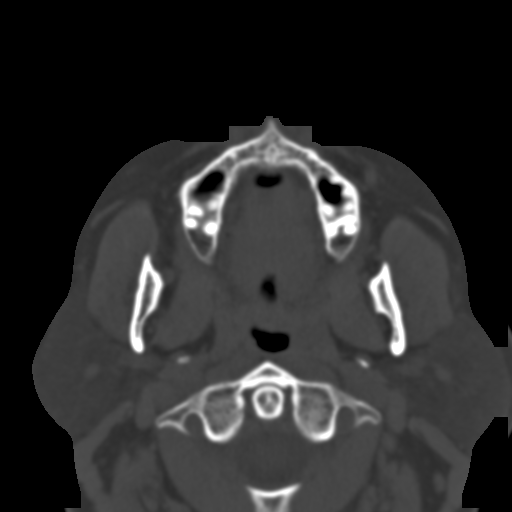
[im 4/15  bone]
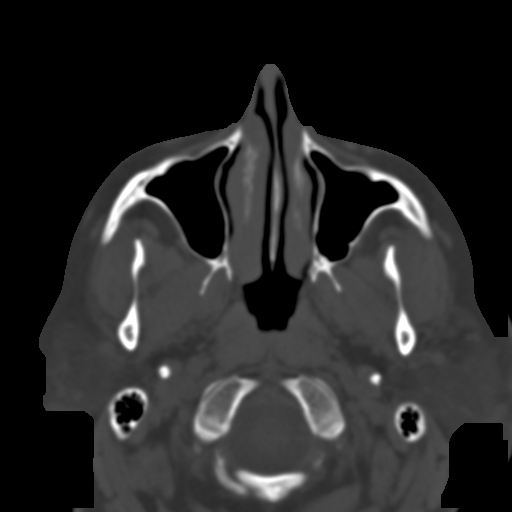
[im 5/15  bone]
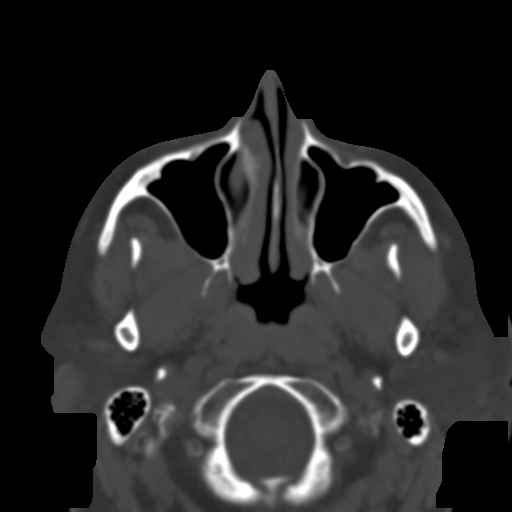
[im 6/15  brain]
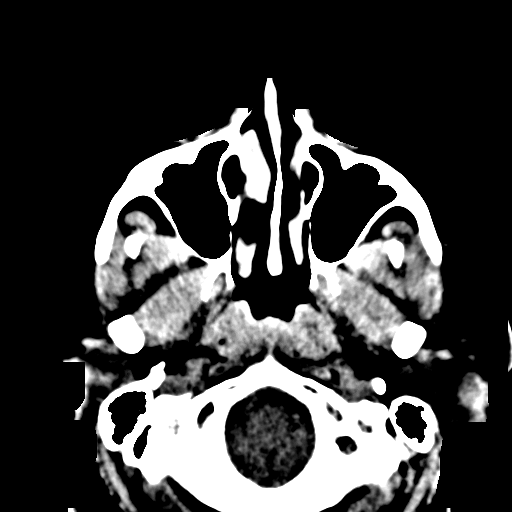
[im 6/15  bone]
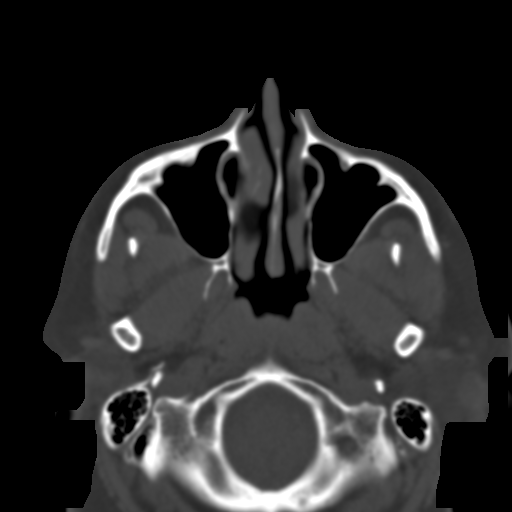
[im 7/15  bone]
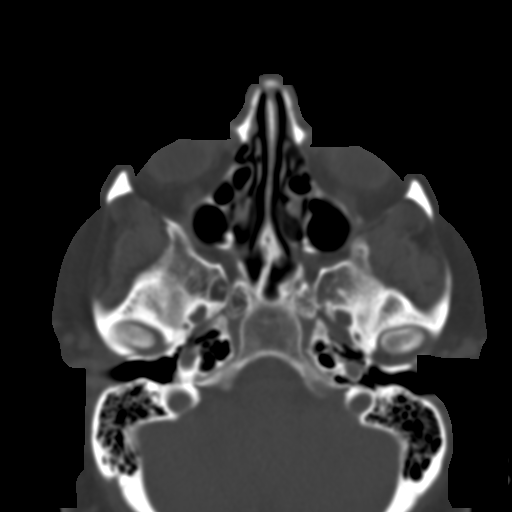
[im 8/15  bone]
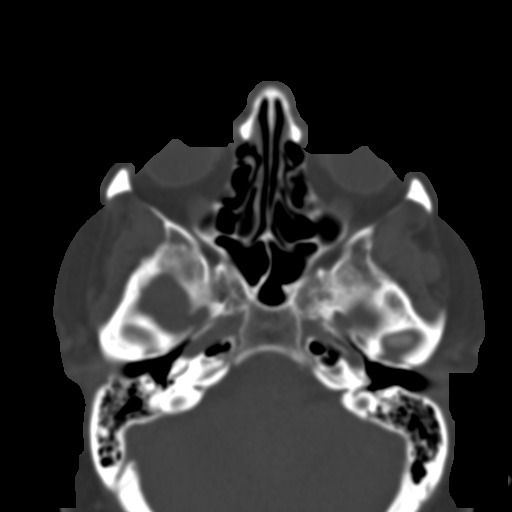
[im 9/15  bone]
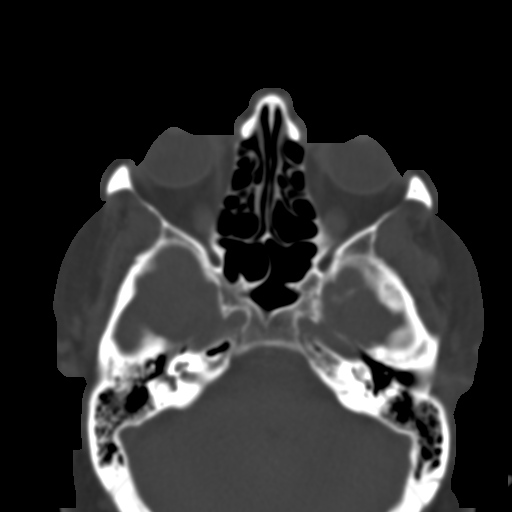
[im 10/15  brain]
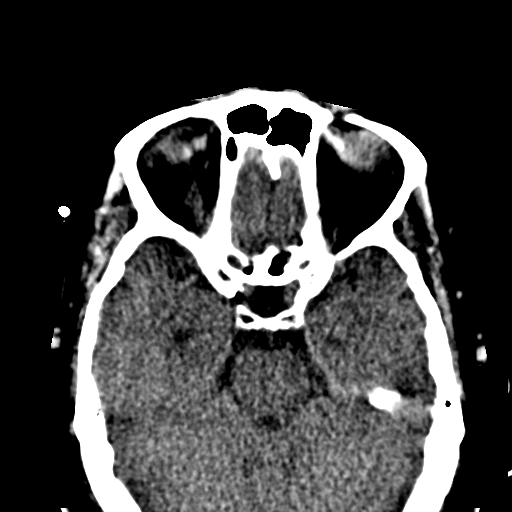
[im 10/15  bone]
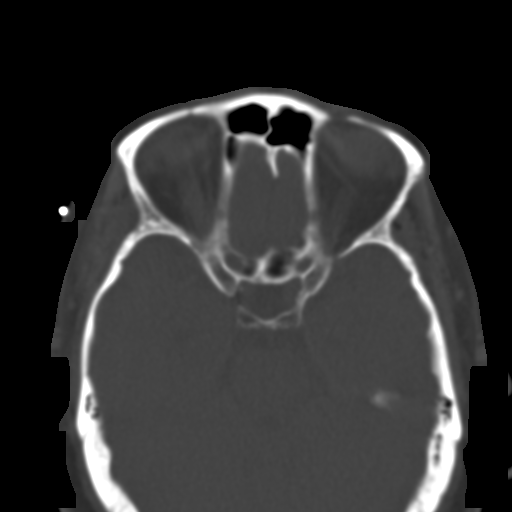
[im 11/15  bone]
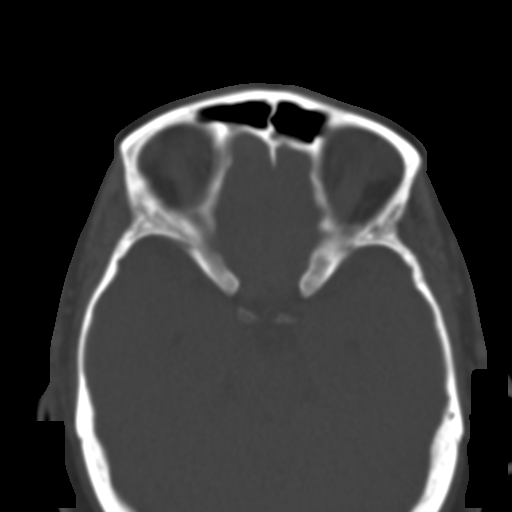
[im 12/15  bone]
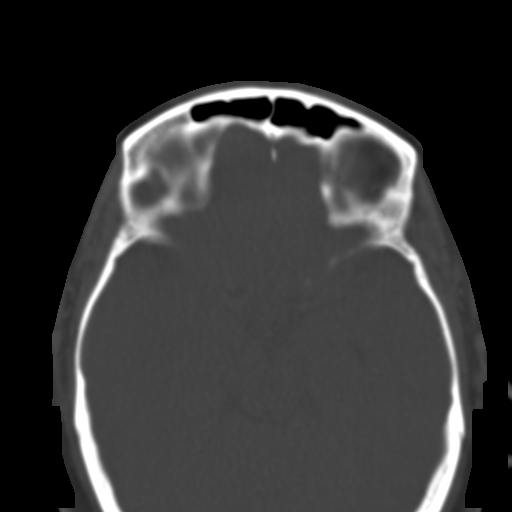
[im 13/15  bone]
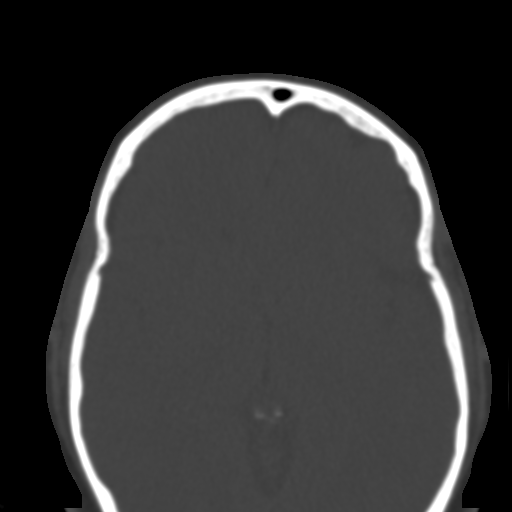
[im 14/15  brain]
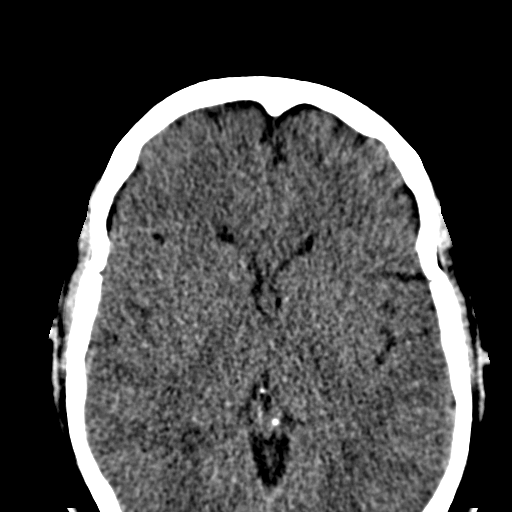
[im 14/15  bone]
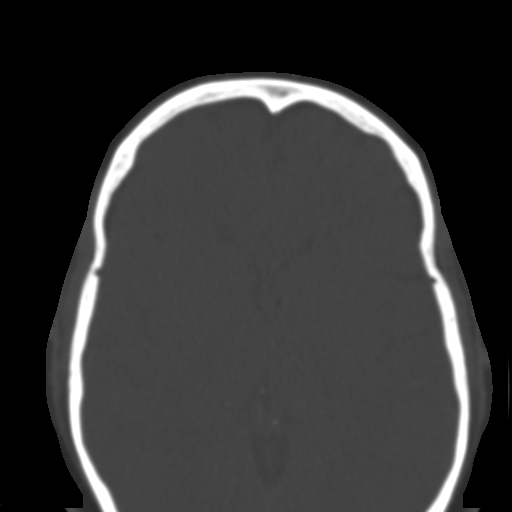

[13 of 15 positions shown; findings below may reference images not displayed]

FINDINGS: The images were obtained with patient supine in the axial plane.
There is no maxillary, sphenoid, ethmoid, or frontal sinus disease.
IMPRESSION: Negative limited sinus CT.

## 2013-11-19 ENCOUNTER — Other Ambulatory Visit: Payer: Managed Care, Other (non HMO)

## 2014-01-12 ENCOUNTER — Other Ambulatory Visit: Payer: Managed Care, Other (non HMO)

## 2014-01-19 ENCOUNTER — Ambulatory Visit: Payer: Managed Care, Other (non HMO) | Admitting: Internal Medicine

## 2014-03-09 ENCOUNTER — Other Ambulatory Visit: Payer: Managed Care, Other (non HMO)

## 2014-03-16 ENCOUNTER — Ambulatory Visit: Payer: Managed Care, Other (non HMO) | Admitting: Internal Medicine

## 2014-03-25 ENCOUNTER — Other Ambulatory Visit: Payer: Managed Care, Other (non HMO)

## 2014-04-01 ENCOUNTER — Ambulatory Visit: Payer: Managed Care, Other (non HMO) | Admitting: Internal Medicine

## 2014-04-29 ENCOUNTER — Other Ambulatory Visit (INDEPENDENT_AMBULATORY_CARE_PROVIDER_SITE_OTHER): Payer: Managed Care, Other (non HMO)

## 2014-04-29 DIAGNOSIS — R7303 Prediabetes: Secondary | ICD-10-CM

## 2014-04-29 DIAGNOSIS — E785 Hyperlipidemia, unspecified: Secondary | ICD-10-CM

## 2014-04-29 DIAGNOSIS — R7309 Other abnormal glucose: Secondary | ICD-10-CM

## 2014-04-29 LAB — LIPID PANEL
Cholesterol: 118 mg/dL (ref 0–200)
HDL: 39.6 mg/dL (ref >39.00)
LDL Cholesterol: 63 mg/dL (ref 0–99)
Total CHOL/HDL Ratio: 3
Triglycerides: 76 mg/dL (ref 0.0–149.0)
VLDL: 15.2 mg/dL (ref 0.0–40.0)

## 2014-04-29 LAB — HEMOGLOBIN A1C: Hgb A1c MFr Bld: 5.5 % (ref 4.6–6.5)

## 2014-05-06 ENCOUNTER — Ambulatory Visit (INDEPENDENT_AMBULATORY_CARE_PROVIDER_SITE_OTHER): Payer: Managed Care, Other (non HMO) | Admitting: Internal Medicine

## 2014-05-06 ENCOUNTER — Encounter: Payer: Self-pay | Admitting: Internal Medicine

## 2014-05-06 VITALS — BP 108/66 | Temp 98.5°F | Ht 66.75 in | Wt 241.0 lb

## 2014-05-06 DIAGNOSIS — R7303 Prediabetes: Secondary | ICD-10-CM

## 2014-05-06 DIAGNOSIS — Z8249 Family history of ischemic heart disease and other diseases of the circulatory system: Secondary | ICD-10-CM

## 2014-05-06 DIAGNOSIS — R7309 Other abnormal glucose: Secondary | ICD-10-CM

## 2014-05-06 DIAGNOSIS — E669 Obesity, unspecified: Secondary | ICD-10-CM

## 2014-05-06 DIAGNOSIS — E785 Hyperlipidemia, unspecified: Secondary | ICD-10-CM

## 2014-05-06 MED ORDER — NAPROXEN 500 MG PO TABS: 500.0000 mg | ORAL_TABLET | Freq: Two times a day (BID) | ORAL | Status: DC

## 2014-05-06 NOTE — Patient Instructions (Signed)
Improved numbers as we discussed . Continue lifestyle intervention healthy eating and exercise . 150 minutes of exercise weeks  ,  Lose weight  To healthy levels. Avoid trans fats and processed foods;  Increase fresh fruits and veges to 5 servings per day. And avoid sweet beverages  Including tea and juice.  Keep going please  continue and don't give up the healthy eating.  Wt Readings from Last 3 Encounters:  05/06/14 241 lb (109.317 kg)  11/04/13 256 lb (116.121 kg)  10/14/13 260 lb (117.935 kg)   Ok to refill

## 2014-05-06 NOTE — Progress Notes (Addendum)
Chief Complaint  Patient presents with  . Follow-up  . Hyperlipidemia    HPI: Comes in for follow up of  Lipids and hyperglycemia  Has  Changed diet not eating  out  Cutting out carbs 4 small meals per day.  [place] had  [place],. No cv sx at this time   ROS: See pertinent positives and negatives per HPI.no cp sob no period for ayer excpt spot once   Past Medical History  Diagnosis Date  . Hx of abnormal cervical Pap smear     used cryo when first married and pregnant  . Sinusitis   . Hyperlipidemia   . Allergic rhinitis   . Depression   . UTI (lower urinary tract infection)   . Hyperglycemia     Family History  Problem Relation Age of Onset  . Heart attack Brother     age 50  . Diabetes type II    . Hyperlipidemia    . Factor V Leiden deficiency Daughter     Also husband    History   Social History  . Marital Status: Married    Spouse Name: N/A    Number of Children: N/A  . Years of Education: N/A   Social History Main Topics  . Smoking status: Former Games developermoker  . Smokeless tobacco: None  . Alcohol Use: 0.6 oz/week    1 Glasses of wine per week  . Drug Use: No  . Sexual Activity:    Other Topics Concern  . None   Social History Narrative   Occupation: 40 per week 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    Married   Regular exercise- yes   HH of 2-  girls home from college   Pet dogs   G2P2    Outpatient Encounter Prescriptions as of 05/06/2014  Medication Sig  . atorvastatin (LIPITOR) 40 MG tablet Take 1 tablet (40 mg total) by mouth daily.  . calcium-vitamin D (OSCAL WITH D) 250-125 MG-UNIT per tablet Take 2 tablets by mouth daily.    Marland Kitchen. FLUoxetine (PROZAC) 20 MG capsule Take 1 capsule (20 mg total) by mouth daily.  . MULTIPLE VITAMIN PO Take by mouth.    . diclofenac (CATAFLAM) 50 MG tablet TAKE 1 TABLET EVERY 4 TO 6 HOURS AS NEEDED FOR PAIN  . naproxen (NAPROSYN) 500 MG tablet Take 1 tablet (500 mg total) by mouth 2 (two) times daily with a meal.  As needed for pain  . ranitidine (ZANTAC) 150 MG tablet   . [DISCONTINUED] fluconazole (DIFLUCAN) 150 MG tablet Take 1 tablet (150 mg total) by mouth once.  . [DISCONTINUED] fluticasone (FLONASE) 50 MCG/ACT nasal spray 2 spray each nostril qd  . [DISCONTINUED] levofloxacin (LEVAQUIN) 750 MG tablet Take 1 tablet (750 mg total) by mouth daily.  . [DISCONTINUED] miconazole (MICONAZOLE 7) 100 MG vaginal suppository Place 1 suppository (100 mg total) vaginally at bedtime.  . [DISCONTINUED] predniSONE (DELTASONE) 20 MG tablet Take 3 po qd for 2 days then 2 po qd for 3 days,or as directed    EXAM:  BP 108/66  Temp(Src) 98.5 F (36.9 C) (Oral)  Ht 5' 6.75" (1.695 m)  Wt 241 lb (109.317 kg)  BMI 38.05 kg/m2  Body mass index is 38.05 kg/(m^2).  GENERAL: vitals reviewed and listed above, alert, oriented, appears well hydrated and in no acute distress HEENT: atraumatic, conjunctiva  clear, no obvious abnormalities on inspection of external nose and ears  PSYCH: pleasant and cooperative, no  obvious depression or anxiety Lab Results  Component Value Date   WBC 5.3 09/09/2013   HGB 13.4 09/09/2013   HCT 39.8 09/09/2013   PLT 245.0 09/09/2013   GLUCOSE 112* 09/09/2013   CHOL 118 04/29/2014   TRIG 76.0 04/29/2014   HDL 39.60 04/29/2014   LDLCALC 63 04/29/2014   ALT 26 09/09/2013   AST 22 09/09/2013   NA 138 09/09/2013   K 4.0 09/09/2013   CL 104 09/09/2013   CREATININE 0.9 09/09/2013   BUN 15 09/09/2013   CO2 27 09/09/2013   TSH 4.90 09/09/2013   HGBA1C 5.5 04/29/2014    ASSESSMENT AND PLAN:  Discussed the following assessment and plan:  Other and unspecified hyperlipidemia - improved on medication  Pre-diabetes - better a1c innormal range with lsi  and weight loss  Obesity - losing weight  to continue 19# since fall  encouraged to continue  Family history of heart disease in female family member before age 50  -Patient advised to return or notify health care team  if symptoms worsen ,persist or  new concerns arise.  Patient Instructions   Improved numbers as we discussed . Continue lifestyle intervention healthy eating and exercise . 150 minutes of exercise weeks  ,  Lose weight  To healthy levels. Avoid trans fats and processed foods;  Increase fresh fruits and veges to 5 servings per day. And avoid sweet beverages  Including tea and juice.  Keep going please  continue and don't give up the healthy eating.  Wt Readings from Last 3 Encounters:  05/06/14 241 lb (109.317 kg)  11/04/13 256 lb (116.121 kg)  10/14/13 260 lb (117.935 kg)   Ok to refill        Qwest CommunicationsWanda K. Panosh M.D.  Pre visit review using our clinic review tool, if applicable. No additional management support is needed unless otherwise documented below in the visit note.  Pt asked about  mobic for left hip pain "partial tear" getting better    Seen by ortho.  Will rx naproxyn with caution short term

## 2014-07-07 ENCOUNTER — Ambulatory Visit (INDEPENDENT_AMBULATORY_CARE_PROVIDER_SITE_OTHER): Payer: Managed Care, Other (non HMO) | Admitting: Psychology

## 2014-07-07 DIAGNOSIS — F909 Attention-deficit hyperactivity disorder, unspecified type: Secondary | ICD-10-CM

## 2014-07-19 ENCOUNTER — Telehealth: Payer: Self-pay | Admitting: Internal Medicine

## 2014-07-19 NOTE — Telephone Encounter (Signed)
Pt would like to know if md received Adult assessment  adhd form from Dr Lowella FairyAlbet. Please call pt

## 2014-07-19 NOTE — Telephone Encounter (Signed)
The adhd assessment in md folder

## 2014-07-21 NOTE — Telephone Encounter (Signed)
Pt is still waiting on callback concerning adhd assessment

## 2014-07-22 NOTE — Telephone Encounter (Signed)
Misty make her a 30 minutes appt  With report   I think i left it on your desk. To save.

## 2014-07-23 NOTE — Telephone Encounter (Signed)
Please make 30 minute appointment.  Thanks!

## 2014-07-23 NOTE — Telephone Encounter (Signed)
lmom for pt to sch appt °

## 2014-07-27 NOTE — Telephone Encounter (Signed)
Pt has been sch

## 2014-07-28 ENCOUNTER — Encounter: Payer: Self-pay | Admitting: Internal Medicine

## 2014-07-28 ENCOUNTER — Ambulatory Visit (INDEPENDENT_AMBULATORY_CARE_PROVIDER_SITE_OTHER): Payer: Managed Care, Other (non HMO) | Admitting: Internal Medicine

## 2014-07-28 VITALS — BP 110/70 | HR 68 | Temp 97.9°F | Wt 255.0 lb

## 2014-07-28 DIAGNOSIS — F909 Attention-deficit hyperactivity disorder, unspecified type: Secondary | ICD-10-CM

## 2014-07-28 DIAGNOSIS — F902 Attention-deficit hyperactivity disorder, combined type: Secondary | ICD-10-CM | POA: Insufficient documentation

## 2014-07-28 MED ORDER — LISDEXAMFETAMINE DIMESYLATE 30 MG PO CAPS
30.0000 mg | ORAL_CAPSULE | Freq: Every day | ORAL | Status: DC
Start: 2014-07-28 — End: 2014-08-27

## 2014-07-28 NOTE — Progress Notes (Signed)
Chief Complaint  Patient presents with  . Medication Management    pos adhd    HPI: Pt comes in after seeing a psychologist for  poss attentional difficulties with evaluation Dr.  Reggy Eye . Self referred . 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 Taking longer to finish.  Works at Nurse, learning disability.   when she was youngerReport cards talking and not paying attention. Grades felt ok   Didn't put a lot of effort.    Kids noticed interjection.   Always been.  high energy motor activity. Didn't study a lot in school but talked a lot in her right family notices it. Didn't really think it was a DD until she read about it and was struggling at her job.  Brother  No dx  Would ask just like herLike him not sitting still.  Nervous energy.  her sister however is calm and focused.  Not a lot of accidents. Drove in [place]  .  Got in trouble some.  but nothing legal  Snoring   No osa sx.  No fam  Hx.  of sleep apnea Stopped  Tobacco 5 years or more.  Etoh:  Beers on week on weekend.  On prozac   seems to help mood menopausal symptoms.  ROS: See pertinent positives and negatives per HPI. no current chest pain shortness of breath cardiovascular symptoms substance use. No history of mania by history. No history of major sleep difficulties.  Past Medical History  Diagnosis Date  . Hx of abnormal cervical Pap smear     used cryo when first married and pregnant  . Sinusitis   . Hyperlipidemia   . Allergic rhinitis   . Depression   . UTI (lower urinary tract infection)   . Hyperglycemia     Family History  Problem Relation Age of Onset  . Heart attack Brother     age 51  . Diabetes type II    . Hyperlipidemia    . Factor V Leiden deficiency Daughter     Also husband    History   Social History  . Marital Status: Married    Spouse Name: N/A    Number of Children: N/A  . Years of Education: N/A   Social History Main Topics  . Smoking status: Former Games developer  . Smokeless tobacco: None  . Alcohol Use: 0.6 oz/week    1 Glasses of wine per week  . Drug Use: No  . Sexual Activity:    Other Topics Concern  . 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 atorvastatin (LIPITOR) 40 MG tablet Take 1 tablet (40 mg total) by mouth daily.  . calcium-vitamin D (OSCAL WITH D) 250-125 MG-UNIT per tablet Take 2 tablets by mouth daily.    Marland Kitchen FLUoxetine (PROZAC) 20 MG capsule Take 1 capsule (20 mg total) by mouth daily.  . MULTIPLE VITAMIN PO Take by mouth.    . naproxen (NAPROSYN) 500 MG tablet Take 1 tablet (500 mg total) by mouth 2 (two) times daily with a  meal. As needed for pain  . ranitidine (ZANTAC) 150 MG tablet   . diclofenac (CATAFLAM) 50 MG tablet TAKE 1 TABLET EVERY 4 TO 6 HOURS AS NEEDED FOR PAIN  . lisdexamfetamine (VYVANSE) 30 MG capsule Take 1 capsule (30 mg total) by mouth daily.    EXAM:  BP 110/70  Pulse 68  Temp(Src) 97.9 F (36.6 C) (Oral)  Wt 255 lb (115.667 kg)  Body mass index is 40.26 kg/(m^2).  GENERAL: vitals reviewed and listed above, alert, oriented, pleasant  appears well hydrated and in no acute distress does have increase fidgeting motor activity but normal affect and speech some interruption in talking  HEENT: atraumatic, conjunctiva  clear, no obvious abnormalities on inspection of external nose and earsNECK: no obvious masses on inspection palpation  LUNGS: clear to auscultation bilaterally, no wheezes, rales or rhonchi, good air movement CV: HRRR, no clubbing cyanosis or  peripheral edema nl cap refill  MS: moves all extremities without noticeable focal  abnormality PSYCH: pleasant and cooperative, no  obvious depression or anxiety  ASSESSMENT AND PLAN:  Discussed the following assessment and plan:  Attention deficit hyperactivity disorder (ADHD), combined type - Most likely affecting social ;work more problematic now they have had tendencies since she was a child risk benefit of medication trial discussed Review of psychological report on the CNS vital signs showed attention problems with impaired complex attention and cognitive flexibility and executive function. Also visual memory. On the adult ADHD scale she reported positive to 16 of 18 symptoms. Self reporting negative for depression. Overall summary felt attentional difficulties related to job performance and work-related spent but no significant anxiety and depression at this time. Medication trial was recommended with caution also discussed organization and coaching help in those areas. At this time she's in except will risk for medication trial prescription for Vyvanse 30 mg one a day dispense 30 upon co-pay [place] plan followup in a month discussion about taking this in the morning and will get her to sign a substance contract when she comes back. Discussed care or of schedule 2 drugs et Karie Sodacetera.  -Patient advised to return or notify health care team  if symptoms worsen ,persist or new concerns arise.  Patient Instructions  You have a lot of symptoms of attentional disorder .  Stimulant medications may help but you need to try to organize  Your environment  to prevent distractions.  Take medication in am  With breakfast  Lasts 10 - 12 hours . ROV in about a month to see how it is working . Contact us with concerns in the meantime. Continue the fluoxetine.    Attention Deficit Hyperactivity Disorder Attention deficit hyperactivity disorder (ADHD) is a problem with behavior issues based on the way the brain functions (neurobehavioral disorder). It is a common reason for behavior and academic problems in school. SYMPTOMS  There are 3  types of ADHD. The 3 types and some of the symptoms include:  Inattentive.  Gets bored or distracted easily.  Loses or forgets things. Forgets to hand in homework.  Has trouble organizing or completing tasks.  Difficulty staying on task.  An inability to organize daily tasks and school work.  Leaving projects, chores, or homework unfinished.  Trouble paying attention or responding to details. Careless mistakes.  Difficulty following directions. Often seems like is not listening.  Dislikes activities that require sustained attention (like chores or homework).  Hyperactive-impulsive.  Feels like it is impossible to sit still or stay in a seat.  Fidgeting with hands and feet.  Trouble waiting turn.  Talking too much or out of turn. Interruptive.  Speaks or acts impulsively.  Aggressive, disruptive behavior.  Constantly busy or on the go; noisy.  Often leaves seat when they are expected to remain seated.  Often runs or climbs where it is not appropriate, or feels very restless.  Combined.  Has symptoms of both of the above. Often children with ADHD feel discouraged about themselves and with school. They often perform well below their abilities in school. As children get older, the excess motor activities can calm down, but the problems with paying attention and staying organized persist. Most children do not outgrow ADHD but with good treatment can learn to cope with the symptoms. DIAGNOSIS  When ADHD is suspected, the diagnosis should be made by professionals trained in ADHD. This professional will collect information about the individual suspected of having ADHD. Information must be collected from various settings where the person lives, works, or attends school.  Diagnosis will include:  Confirming symptoms began in childhood.  Ruling out other reasons for the child's behavior.  The health care providers will check with the child's school and check their medical  records.  They will talk to teachers and parents.  Behavior rating scales for the child will be filled out by those dealing with the child on a daily basis. A diagnosis is made only after all information has been considered. TREATMENT  Treatment usually includes behavioral treatment, tutoring or extra support in school, and stimulant medicines. Because of the way a person's brain works with ADHD, these medicines decrease impulsivity and hyperactivity and increase attention. This is different than how they would work in a person who does not have ADHD. Other medicines used include antidepressants and certain blood pressure medicines. Most experts agree that treatment for ADHD should address all aspects of the person's functioning. Along with medicines, treatment should include structured classroom management at school. Parents should reward good behavior, provide constant discipline, and set limits. Tutoring should be available for the child as needed. ADHD is a lifelong condition. If untreated, the disorder can have long-term serious effects into adolescence and adulthood. HOME CARE INSTRUCTIONS   Often with ADHD there is a lot of frustration among family members dealing with the condition. Blame and anger are also feelings that are common. In many cases, because the problem affects the family as a whole, the entire family may need help. A therapist can help the family find better ways to handle the disruptive behaviors of the person with ADHD and promote change. If the person with ADHD is young, most of the therapist's work is with the parents. Parents will learn techniques for coping with and improving their child's behavior. Sometimes only the child with the ADHD needs counseling. Your health care providers can help you make these decisions.  Children with ADHD may need help learning how to organize. Some helpful tips include:  Keep routines the same every day from wake-up time to bedtime.  Schedule all activities, including homework and playtime. Keep the schedule in a place where the person with ADHD will often see it. Mark schedule changes as far in advance as possible.  Schedule outdoor and indoor recreation.  Have a place for everything and keep everything in its place. This includes clothing, backpacks, and school supplies.  Encourage writing down assignments and bringing home needed books. Work with your child's teachers for assistance in organizing school work.  Offer your child a well-balanced  diet. Breakfast that includes a balance of whole grains, protein, and fruits or vegetables is especially important for school performance. Children should avoid drinks with caffeine including:  Soft drinks.  Coffee.  Tea.  However, some older children (adolescents) may find these drinks helpful in improving their attention. Because it can also be common for adolescents with ADHD to become addicted to caffeine, talk with your health care provider about what is a safe amount of caffeine intake for your child.  Children with ADHD need consistent rules that they can understand and follow. If rules are followed, give small rewards. Children with ADHD often receive, and expect, criticism. Look for good behavior and praise it. Set realistic goals. Give clear instructions. Look for activities that can foster success and self-esteem. Make time for pleasant activities with your child. Give lots of affection.  Parents are their children's greatest advocates. Learn as much as possible about ADHD. This helps you become a stronger and better advocate for your child. It also helps you educate your child's teachers and instructors if they feel inadequate in these areas. Parent support groups are often helpful. A national group with local chapters is called Children and Adults with Attention Deficit Hyperactivity Disorder (CHADD). SEEK MEDICAL CARE IF:  Your child has repeated muscle twitches,  cough, or speech outbursts.  Your child has sleep problems.  Your child has a marked loss of appetite.  Your child develops depression.  Your child has new or worsening behavioral problems.  Your child develops dizziness.  Your child has a racing heart.  Your child has stomach pains.  Your child develops headaches. SEEK IMMEDIATE MEDICAL CARE IF:  Your child has been diagnosed with depression or anxiety and the symptoms seem to be getting worse.  Your child has been depressed and suddenly appears to have increased energy or motivation.  You are worried that your child is having a bad reaction to a medication he or she is taking for ADHD. Document Released: 12/07/2002 Document Revised: 12/22/2013 Document Reviewed: 08/24/2013 [facility] Patient Information 2015 [place], [place]. This information is not intended to replace advice [place] to you by your health care provider. Make sure you discuss any questions you have with your health care provider.      Neta Mends. Panosh M.D.  Total visit > 50% spent counseling and coordinating care

## 2014-07-28 NOTE — Progress Notes (Signed)
Pre visit review using our clinic review tool, if applicable. No additional management support is needed unless otherwise documented below in the visit note. 

## 2014-07-28 NOTE — Patient Instructions (Addendum)
You have a lot of symptoms of attentional disorder .  Stimulant medications may help but you need to try to organize  Your environment  to prevent distractions.  Take medication in am  With breakfast  Lasts 10 - 12 hours . ROV in about a month to see how it is working . Contact us with concerns in the meantime. Continue the fluoxetine.    Attention Deficit Hyperactivity Disorder Attention deficit hyperactivity disorder (ADHD) is a problem with behavior issues based on the way the brain functions (neurobehavioral disorder). It is a common reason for behavior and academic problems in school. SYMPTOMS  There are 3 types of ADHD. The 3 types and some of the symptoms include:  Inattentive.  Gets bored or distracted easily.  Loses or forgets things. Forgets to hand in homework.  Has trouble organizing or completing tasks.  Difficulty staying on task.  An inability to organize daily tasks and school work.  Leaving projects, chores, or homework unfinished.  Trouble paying attention or responding to details. Careless mistakes.  Difficulty following directions. Often seems like is not listening.  Dislikes activities that require sustained attention (like chores or homework).  Hyperactive-impulsive.  Feels like it is impossible to sit still or stay in a seat. Fidgeting with hands and feet.  Trouble waiting turn.  Talking too much or out of turn. Interruptive.  Speaks or acts impulsively.  Aggressive, disruptive behavior.  Constantly busy or on the go; noisy.  Often leaves seat when they are expected to remain seated.  Often runs or climbs where it is not appropriate, or feels very restless.  Combined.  Has symptoms of both of the above. Often children with ADHD feel discouraged about themselves and with school. They often perform well below their abilities in school. As children get older, the excess motor activities can calm down, but the problems with paying attention  and staying organized persist. Most children do not outgrow ADHD but with good treatment can learn to cope with the symptoms. DIAGNOSIS  When ADHD is suspected, the diagnosis should be made by professionals trained in ADHD. This professional will collect information about the individual suspected of having ADHD. Information must be collected from various settings where the person lives, works, or attends school.  Diagnosis will include:  Confirming symptoms began in childhood.  Ruling out other reasons for the child's behavior.  The health care providers will check with the child's school and check their medical records.  They will talk to teachers and parents.  Behavior rating scales for the child will be filled out by those dealing with the child on a daily basis. A diagnosis is made only after all information has been considered. TREATMENT  Treatment usually includes behavioral treatment, tutoring or extra support in school, and stimulant medicines. Because of the way a person's brain works with ADHD, these medicines decrease impulsivity and hyperactivity and increase attention. This is different than how they would work in a person who does not have ADHD. Other medicines used include antidepressants and certain blood pressure medicines. Most experts agree that treatment for ADHD should address all aspects of the person's functioning. Along with medicines, treatment should include structured classroom management at school. Parents should reward good behavior, provide constant discipline, and set limits. Tutoring should be available for the child as needed. ADHD is a lifelong condition. If untreated, the disorder can have long-term serious effects into adolescence and adulthood. HOME CARE INSTRUCTIONS   Often with ADHD there is a lot  of frustration among family members dealing with the condition. Blame and anger are also feelings that are common. In many cases, because the problem affects the  family as a whole, the entire family may need help. A therapist can help the family find better ways to handle the disruptive behaviors of the person with ADHD and promote change. If the person with ADHD is young, most of the therapist's work is with the parents. Parents will learn techniques for coping with and improving their child's behavior. Sometimes only the child with the ADHD needs counseling. Your health care providers can help you make these decisions.  Children with ADHD may need help learning how to organize. Some helpful tips include:  Keep routines the same every day from wake-up time to bedtime. Schedule all activities, including homework and playtime. Keep the schedule in a place where the person with ADHD will often see it. Mark schedule changes as far in advance as possible.  Schedule outdoor and indoor recreation.  Have a place for everything and keep everything in its place. This includes clothing, backpacks, and school supplies.  Encourage writing down assignments and bringing home needed books. Work with your child's teachers for assistance in organizing school work.  Offer your child a well-balanced diet. Breakfast that includes a balance of whole grains, protein, and fruits or vegetables is especially important for school performance. Children should avoid drinks with caffeine including:  Soft drinks.  Coffee.  Tea.  However, some older children (adolescents) may find these drinks helpful in improving their attention. Because it can also be common for adolescents with ADHD to become addicted to caffeine, talk with your health care provider about what is a safe amount of caffeine intake for your child.  Children with ADHD need consistent rules that they can understand and follow. If rules are followed, give small rewards. Children with ADHD often receive, and expect, criticism. Look for good behavior and praise it. Set realistic goals. Give clear instructions. Look for  activities that can foster success and self-esteem. Make time for pleasant activities with your child. Give lots of affection.  Parents are their children's greatest advocates. Learn as much as possible about ADHD. This helps you become a stronger and better advocate for your child. It also helps you educate your child's teachers and instructors if they feel inadequate in these areas. Parent support groups are often helpful. A national group with local chapters is called Children and Adults with Attention Deficit Hyperactivity Disorder (CHADD). SEEK MEDICAL CARE IF:  Your child has repeated muscle twitches, cough, or speech outbursts.  Your child has sleep problems.  Your child has a marked loss of appetite.  Your child develops depression.  Your child has new or worsening behavioral problems.  Your child develops dizziness.  Your child has a racing heart.  Your child has stomach pains.  Your child develops headaches. SEEK IMMEDIATE MEDICAL CARE IF:  Your child has been diagnosed with depression or anxiety and the symptoms seem to be getting worse.  Your child has been depressed and suddenly appears to have increased energy or motivation.  You are worried that your child is having a bad reaction to a medication he or she is taking for ADHD. Document Released: 12/07/2002 Document Revised: 12/22/2013 Document Reviewed: 08/24/2013 [facility] Patient Information 2015 [place], [place]. This information is not intended to replace advice [place] to you by your health care provider. Make sure you discuss any questions you have with your health care provider.

## 2014-07-29 ENCOUNTER — Telehealth: Payer: Self-pay | Admitting: Internal Medicine

## 2014-07-29 ENCOUNTER — Encounter: Payer: Self-pay | Admitting: Internal Medicine

## 2014-07-29 MED ORDER — AMPHETAMINE-DEXTROAMPHET ER 15 MG PO CP24: 15.0000 mg | ORAL_CAPSULE | ORAL | Status: DC

## 2014-07-29 NOTE — Telephone Encounter (Signed)
Pt can not afford vyvanase. Pt would like to try generic adderall. Pt was seen yesterday

## 2014-07-29 NOTE — Telephone Encounter (Signed)
Spoke to pt told her will change Rx to Adderall, but needs to bring Rx back. Pt verbalized understanding and stated she left the Rx with the pharmacy. Told pt okay will call pharmacy and verify Rx was with them and I will call you back. Pt verbalized understanding.

## 2014-07-29 NOTE — Telephone Encounter (Signed)
Bring back vyvanse  rxback or pharmacy verification  adderall x r 15 mg  1 po qd disp 30

## 2014-07-29 NOTE — Telephone Encounter (Signed)
Please see message and advise 

## 2014-07-29 NOTE — Telephone Encounter (Signed)
Called pharmacy and spoke to LennoxMike, asked him if pt left Rx there and if destroyed? Kathlene NovemberMike said yes, Rx was destroyed. Told him okay.  Called pt told her okay can come by office and pickup new Rx. Pt verbalized understanding. Rx printed and signed by Dr. Fabian SharpPanosh.

## 2014-08-27 ENCOUNTER — Encounter: Payer: Self-pay | Admitting: *Deleted

## 2014-08-27 ENCOUNTER — Encounter: Payer: Self-pay | Admitting: Internal Medicine

## 2014-08-27 ENCOUNTER — Ambulatory Visit (INDEPENDENT_AMBULATORY_CARE_PROVIDER_SITE_OTHER): Payer: Managed Care, Other (non HMO) | Admitting: Internal Medicine

## 2014-08-27 VITALS — BP 104/70 | Temp 98.7°F | Ht 66.75 in | Wt 249.0 lb

## 2014-08-27 DIAGNOSIS — Z79899 Other long term (current) drug therapy: Secondary | ICD-10-CM

## 2014-08-27 DIAGNOSIS — F902 Attention-deficit hyperactivity disorder, combined type: Secondary | ICD-10-CM

## 2014-08-27 DIAGNOSIS — Z23 Encounter for immunization: Secondary | ICD-10-CM

## 2014-08-27 DIAGNOSIS — F909 Attention-deficit hyperactivity disorder, unspecified type: Secondary | ICD-10-CM

## 2014-08-27 MED ORDER — AMPHETAMINE-DEXTROAMPHET ER 20 MG PO CP24: 20.0000 mg | ORAL_CAPSULE | Freq: Every day | ORAL | Status: DC

## 2014-08-27 NOTE — Patient Instructions (Addendum)
Advise increase medication dosing .  To 20 xr   Contact us my chart or call about response and decision to  Increase dose so we dont have to wait a whole month if needed to increase to 25 mg xr   ROV in about 2 month or at check up.

## 2014-08-27 NOTE — Assessment & Plan Note (Signed)
Un fortunately insurance coverage  Limited for vyvanse cost add xr 15 not effective enough so titration up every 2-4 weeks  . Fu  At Nov  Visit or earlier if needed

## 2014-08-27 NOTE — Progress Notes (Signed)
Pre visit review using our clinic review tool, if applicable. No additional management support is needed unless otherwise documented below in the visit note.  Chief Complaint  Patient presents with  . Follow-up  . ADHD    HPI: Victoria Carter Comes in for  Fu of adhd meds   DIdnt get vyvanse filled cause of cost even with coupon  Usually taking  Am  And uncertain if helping  Except dec motor  Not that much  In concentration.  Still hard to change task. No sig se noted  No significant side effects such as major sleep issues and mood changes, chest pain, shortness of breath, headaches , GI or significant weight loss.  ROS: See pertinent positives and negatives per HPI.  Past Medical History  Diagnosis Date  . Hx of abnormal cervical Pap smear     used cryo when first married and pregnant  . Sinusitis   . Hyperlipidemia   . Allergic rhinitis   . Depression   . UTI (lower urinary tract infection)   . Hyperglycemia     Family History  Problem Relation Age of Onset  . Heart attack Brother     age 108  . Diabetes type II    . Hyperlipidemia    . Factor V Leiden deficiency Daughter     Also husband    History   Social History  . Marital Status: Married    Spouse Name: N/A    Number of Children: N/A  . Years of Education: N/A   Social History Main Topics  . Smoking status: Former Games developer  . Smokeless tobacco: None  . Alcohol Use: 0.6 oz/week    1 Glasses of wine per week  . Drug Use: No  . Sexual Activity:    Other Topics Concern  . None   Social History Narrative   Occupation: 40 per week 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  [employer]    Married   Regular exercise- yes   HH of 2-  girls home from college   Pet dogs   G2P2    Outpatient Encounter Prescriptions as of 08/27/2014  Medication Sig  . atorvastatin (LIPITOR) 40 MG tablet Take 1 tablet (40 mg total) by mouth daily.  . calcium-vitamin D (OSCAL WITH D) 250-125 MG-UNIT per tablet Take 2 tablets by mouth daily.    Marland Kitchen  FLUoxetine (PROZAC) 20 MG capsule Take 1 capsule (20 mg total) by mouth daily.  . MULTIPLE VITAMIN PO Take by mouth.    . naproxen (NAPROSYN) 500 MG tablet Take 1 tablet (500 mg total) by mouth 2 (two) times daily with a meal. As needed for pain  . ranitidine (ZANTAC) 150 MG tablet   . [DISCONTINUED] amphetamine-dextroamphetamine (ADDERALL XR) 15 MG 24 hr capsule Take 1 capsule (15 mg total) by mouth every morning. For adhd  . amphetamine-dextroamphetamine (ADDERALL XR) 20 MG 24 hr capsule Take 1 capsule (20 mg total) by mouth daily.  . [DISCONTINUED] diclofenac (CATAFLAM) 50 MG tablet TAKE 1 TABLET EVERY 4 TO 6 HOURS AS NEEDED FOR PAIN  . [DISCONTINUED] lisdexamfetamine (VYVANSE) 30 MG capsule Take 1 capsule (30 mg total) by mouth daily.    EXAM:  BP 104/70  Temp(Src) 98.7 F (37.1 C) (Oral)  Ht 5' 6.75" (1.695 m)  Wt 249 lb (112.946 kg)  BMI 39.31 kg/m2  Body mass index is 39.31 kg/(m^2). Wt Readings from Last 3 Encounters:  08/27/14 249 lb (112.946 kg)  07/28/14 255 lb (115.667 kg)  05/06/14 241  lb (109.317 kg)    GENERAL: vitals reviewed and listed above, alert, oriented, appears well hydrated and in no acute distress nl speech  And cognition.  PSYCH: pleasant and cooperative, no obvious depression or anxiety  ASSESSMENT AND PLAN:  Discussed the following assessment and plan:  Attention deficit hyperactivity disorder (ADHD), combined type - imp motor activity but otherwise inadequate response ;incr to 20 mg xr and in 2 weeks contact may end updoing best on 25- 30 mg doses  Medication management  Need for prophylactic vaccination and inoculation against influenza - Plan: Flu Vaccine QUAD 36+ mos PF IM (Fluarix Quad PF) -Patient advised to return or notify health care team  if symptoms worsen ,persist or new concerns arise.  Patient Instructions  Advise increase medication dosing .  To 20 xr   Contact us my chart or call about response and decision to  Increase dose so we  dont have to wait a whole month if needed to increase to 25 mg xr   ROV in about 2 month or at check up.   Neta Mends. Panosh M.D.

## 2014-08-29 DIAGNOSIS — Z79899 Other long term (current) drug therapy: Secondary | ICD-10-CM | POA: Insufficient documentation

## 2014-08-30 ENCOUNTER — Ambulatory Visit: Payer: BC Managed Care – HMO | Admitting: Internal Medicine

## 2014-08-31 ENCOUNTER — Encounter: Payer: Self-pay | Admitting: Internal Medicine

## 2014-09-01 NOTE — Telephone Encounter (Signed)
The PA was faxed yesterday to pt's insurance company.

## 2014-09-02 NOTE — Telephone Encounter (Signed)
I received a fax from [facility] and the medication has been approved 08/31/14-08/31/17.

## 2014-09-15 ENCOUNTER — Encounter: Payer: Self-pay | Admitting: Internal Medicine

## 2014-09-16 ENCOUNTER — Other Ambulatory Visit: Payer: Self-pay | Admitting: Family Medicine

## 2014-09-16 MED ORDER — AMPHETAMINE-DEXTROAMPHET ER 25 MG PO CP24: 25.0000 mg | ORAL_CAPSULE | ORAL | Status: DC

## 2014-09-17 ENCOUNTER — Other Ambulatory Visit: Payer: Self-pay

## 2014-09-17 DIAGNOSIS — Z1231 Encounter for screening mammogram for malignant neoplasm of breast: Secondary | ICD-10-CM

## 2014-09-23 ENCOUNTER — Other Ambulatory Visit: Payer: Self-pay | Admitting: Internal Medicine

## 2014-10-11 ENCOUNTER — Ambulatory Visit
Admission: RE | Admit: 2014-10-11 | Discharge: 2014-10-11 | Disposition: A | Discharge status: Home / Self Care | Payer: BC Managed Care – PPO | Source: Ambulatory Visit

## 2014-10-11 DIAGNOSIS — Z1231 Encounter for screening mammogram for malignant neoplasm of breast: Secondary | ICD-10-CM

## 2014-10-13 ENCOUNTER — Other Ambulatory Visit: Payer: Self-pay | Admitting: Internal Medicine

## 2014-10-13 DIAGNOSIS — R928 Other abnormal and inconclusive findings on diagnostic imaging of breast: Secondary | ICD-10-CM

## 2014-10-20 ENCOUNTER — Other Ambulatory Visit: Payer: Self-pay

## 2014-10-20 ENCOUNTER — Other Ambulatory Visit: Payer: Self-pay | Admitting: Internal Medicine

## 2014-10-20 DIAGNOSIS — R928 Other abnormal and inconclusive findings on diagnostic imaging of breast: Secondary | ICD-10-CM

## 2014-10-25 ENCOUNTER — Ambulatory Visit
Admission: RE | Admit: 2014-10-25 | Discharge: 2014-10-25 | Disposition: A | Discharge status: Home / Self Care | Payer: BC Managed Care – PPO | Source: Ambulatory Visit | Attending: Internal Medicine | Admitting: Internal Medicine

## 2014-10-25 DIAGNOSIS — R928 Other abnormal and inconclusive findings on diagnostic imaging of breast: Secondary | ICD-10-CM

## 2014-10-27 ENCOUNTER — Other Ambulatory Visit: Payer: Self-pay | Admitting: Internal Medicine

## 2014-10-27 DIAGNOSIS — N63 Unspecified lump in unspecified breast: Secondary | ICD-10-CM

## 2014-11-01 ENCOUNTER — Encounter: Payer: Self-pay | Admitting: Internal Medicine

## 2014-11-01 ENCOUNTER — Other Ambulatory Visit (INDEPENDENT_AMBULATORY_CARE_PROVIDER_SITE_OTHER): Payer: Managed Care, Other (non HMO)

## 2014-11-01 DIAGNOSIS — E785 Hyperlipidemia, unspecified: Secondary | ICD-10-CM

## 2014-11-01 DIAGNOSIS — Z0000 Encounter for general adult medical examination without abnormal findings: Secondary | ICD-10-CM

## 2014-11-01 LAB — CBC WITH DIFFERENTIAL/PLATELET
Basophils Absolute: 0 10*3/uL (ref 0.0–0.1)
Basophils Relative: 0.5 % (ref 0.0–3.0)
Eosinophils Absolute: 0.3 10*3/uL (ref 0.0–0.7)
Eosinophils Relative: 3.8 % (ref 0.0–5.0)
HCT: 39 % (ref 36.0–46.0)
Hemoglobin: 13 g/dL (ref 12.0–15.0)
Lymphocytes Relative: 26.8 % (ref 12.0–46.0)
Lymphs Abs: 1.8 10*3/uL (ref 0.7–4.0)
MCHC: 33.3 g/dL (ref 30.0–36.0)
MCV: 88.6 fl (ref 78.0–100.0)
Monocytes Absolute: 0.5 10*3/uL (ref 0.1–1.0)
Monocytes Relative: 6.9 % (ref 3.0–12.0)
Neutro Abs: 4.2 10*3/uL (ref 1.4–7.7)
Neutrophils Relative %: 62 % (ref 43.0–77.0)
Platelets: 259 10*3/uL (ref 150.0–400.0)
RBC: 4.4 Mil/uL (ref 3.87–5.11)
RDW: 13.4 % (ref 11.5–15.5)
WBC: 6.8 10*3/uL (ref 4.0–10.5)

## 2014-11-01 LAB — HEPATIC FUNCTION PANEL
ALT: 26 U/L (ref 0–35)
AST: 24 U/L (ref 0–37)
Albumin: 3.7 g/dL (ref 3.5–5.2)
Alkaline Phosphatase: 88 U/L (ref 39–117)
Bilirubin, Direct: 0 mg/dL (ref 0.0–0.3)
Total Bilirubin: 0.2 mg/dL (ref 0.2–1.2)
Total Protein: 6.9 g/dL (ref 6.0–8.3)

## 2014-11-01 LAB — TSH: TSH: 2.06 u[IU]/mL (ref 0.35–4.50)

## 2014-11-01 LAB — LIPID PANEL
Cholesterol: 152 mg/dL (ref 0–200)
HDL: 36.7 mg/dL — ABNORMAL LOW (ref >39.00)
NonHDL: 115.3
Total CHOL/HDL Ratio: 4
Triglycerides: 286 mg/dL — ABNORMAL HIGH (ref 0.0–149.0)
VLDL: 57.2 mg/dL — ABNORMAL HIGH (ref 0.0–40.0)

## 2014-11-01 LAB — LDL CHOLESTEROL, DIRECT: Direct LDL: 88.6 mg/dL

## 2014-11-01 LAB — BASIC METABOLIC PANEL
BUN: 12 mg/dL (ref 6–23)
CO2: 22 mEq/L (ref 19–32)
Calcium: 9.1 mg/dL (ref 8.4–10.5)
Chloride: 108 mEq/L (ref 96–112)
Creatinine, Ser: 1 mg/dL (ref 0.4–1.2)
GFR: 64.46 mL/min (ref >60.00)
Glucose, Bld: 102 mg/dL — ABNORMAL HIGH (ref 70–99)
Potassium: 4 mEq/L (ref 3.5–5.1)
Sodium: 141 mEq/L (ref 135–145)

## 2014-11-08 ENCOUNTER — Encounter: Payer: Self-pay | Admitting: Internal Medicine

## 2014-11-08 ENCOUNTER — Ambulatory Visit (INDEPENDENT_AMBULATORY_CARE_PROVIDER_SITE_OTHER): Payer: Managed Care, Other (non HMO) | Admitting: Internal Medicine

## 2014-11-08 VITALS — BP 122/78 | Temp 98.7°F | Ht 66.25 in | Wt 245.7 lb

## 2014-11-08 DIAGNOSIS — E669 Obesity, unspecified: Secondary | ICD-10-CM

## 2014-11-08 DIAGNOSIS — G4730 Sleep apnea, unspecified: Secondary | ICD-10-CM

## 2014-11-08 DIAGNOSIS — G478 Other sleep disorders: Secondary | ICD-10-CM

## 2014-11-08 DIAGNOSIS — N951 Menopausal and female climacteric states: Secondary | ICD-10-CM

## 2014-11-08 DIAGNOSIS — Z0000 Encounter for general adult medical examination without abnormal findings: Secondary | ICD-10-CM

## 2014-11-08 DIAGNOSIS — F902 Attention-deficit hyperactivity disorder, combined type: Secondary | ICD-10-CM

## 2014-11-08 DIAGNOSIS — Z79899 Other long term (current) drug therapy: Secondary | ICD-10-CM

## 2014-11-08 DIAGNOSIS — E782 Mixed hyperlipidemia: Secondary | ICD-10-CM

## 2014-11-08 MED ORDER — AMPHETAMINE-DEXTROAMPHET ER 25 MG PO CP24: 25.0000 mg | ORAL_CAPSULE | ORAL | Status: DC

## 2014-11-08 MED ORDER — RANITIDINE HCL 150 MG PO TABS: 150.0000 mg | ORAL_TABLET | Freq: Two times a day (BID) | ORAL | Status: DC

## 2014-11-08 NOTE — Patient Instructions (Addendum)
Tracking can help behavior.  And activity  Can do referral for snoring and sleep  Also  To dietician . For triglycerides and weight.    Food Choices to Lower Your Triglycerides  Triglycerides are a type of fat in your blood. High levels of triglycerides can increase the risk of heart disease and stroke. If your triglyceride levels are high, the foods you eat and your eating habits are very important. Choosing the right foods can help lower your triglycerides.  WHAT GENERAL GUIDELINES DO I NEED TO FOLLOW?  Lose weight if you are overweight.   Limit or avoid alcohol.   Fill one half of your plate with vegetables and green salads.   Limit fruit to two servings a day. Choose fruit instead of juice.   Make one fourth of your plate whole grains. Look for the word "whole" as the first word in the ingredient list.  Fill one fourth of your plate with lean protein foods.  Enjoy fatty fish (such as salmon, mackerel, sardines, and tuna) three times a week.   Choose healthy fats.   Limit foods high in starch and sugar.  Eat more home-cooked food and less restaurant, buffet, and fast food.  Limit fried foods.  Cook foods using methods other than frying.  Limit saturated fats.  Check ingredient lists to avoid foods with partially hydrogenated oils (trans fats) in them. WHAT FOODS CAN I EAT?  Grains Whole grains, such as whole wheat or whole grain breads, crackers, cereals, and pasta. Unsweetened oatmeal, bulgur, barley, quinoa, or brown rice. Corn or whole wheat flour tortillas.  Vegetables Fresh or frozen vegetables (raw, steamed, roasted, or grilled). Green salads. Fruits All fresh, canned (in natural juice), or frozen fruits. Meat and Other Protein Products Ground beef (85% or leaner), grass-fed beef, or beef trimmed of fat. Skinless chicken or [place]key. Ground chicken or [place]. Pork trimmed of fat. All fish and seafood. Eggs. Dried beans, peas, or lentils. Unsalted nuts or  seeds. Unsalted canned or dry beans. Dairy Low-fat dairy products, such as skim or 1% milk, 2% or reduced-fat cheeses, low-fat ricotta or cottage cheese, or plain low-fat yogurt. Fats and Oils Tub margarines without trans fats. Light or reduced-fat mayonnaise and salad dressings. Avocado. Safflower, olive, or canola oils. Natural peanut or almond butter. The items listed above may not be a complete list of recommended foods or beverages. Contact your dietitian for more options. WHAT FOODS ARE NOT RECOMMENDED?  Grains White bread. White pasta. White rice. Cornbread. Bagels, pastries, and croissants. Crackers that contain trans fat. Vegetables White potatoes. Corn. Creamed or fried vegetables. Vegetables in a cheese sauce. Fruits Dried fruits. Canned fruit in light or heavy syrup. Fruit juice. Meat and Other Protein Products Fatty cuts of meat. Ribs, chicken wings, bacon, sausage, bologna, salami, chitterlings, fatback, hot dogs, bratwurst, and packaged luncheon meats. Dairy Whole or 2% milk, cream, half-and-half, and cream cheese. Whole-fat or sweetened yogurt. Full-fat cheeses. Nondairy creamers and whipped toppings. Processed cheese, cheese spreads, or cheese curds. Sweets and Desserts Corn syrup, sugars, honey, and molasses. Candy. Jam and jelly. Syrup. Sweetened cereals. Cookies, pies, cakes, donuts, muffins, and ice cream. Fats and Oils Butter, stick margarine, lard, shortening, ghee, or bacon fat. Coconut, palm kernel, or palm oils. Beverages Alcohol. Sweetened drinks (such as sodas, lemonade, and fruit drinks or punches). The items listed above may not be a complete list of foods and beverages to avoid. Contact your dietitian for more information. Document Released: 10/04/2004 Document Revised: 12/22/2013 Document  Reviewed: 10/21/2013 ExitCare Patient Information 2015 [place], [place]. This information is not intended to replace advice [place] to you by your health care provider. Make  sure you discuss any questions you have with your health care provider.

## 2014-11-08 NOTE — Progress Notes (Signed)
Pre visit review using our clinic review tool, if applicable. No additional management support is needed unless otherwise documented below in the visit note.  Chief Complaint  Patient presents with  . Annual Exam    add meds  . Hyperlipidemia    HPI: Patient comes in today for Preventive Health Care visit and  Chronic disease management and med evaluation. No major changes  In health.  Job stressful med helps  No untoward se   Add on adderall 25 xr   Lipids : meds not paying att to diet and exercise cause of stress.  Could do better   Stress at work and difficult.   mammo following luster cyst right in 6 months  Wants zantac for HB  Snoring very loud husband says  Lots of body movvement some concerns    Health Maintenance  Topic Date Due  . PAP SMEAR  06/12/2015  . INFLUENZA VACCINE  08/01/2015  . MAMMOGRAM  10/25/2016  . TETANUS/TDAP  06/11/2022  . COLONOSCOPY  11/14/2022   Health Maintenance Review LIFESTYLE:  Exercise:   Little mowing yard.  Tobacco/ETS: no Alcohol: ocass Sugar beverages:no Sleep:  wakenings dog. 6-7 hours  Drug use: no Colonoscopy:  Had precancer an due next year .   mammo  Done.  Following cyst right .  ROS:  GEN/ HEENT: No fever, significant weight changes sweats headaches vision problems hearing changes, CV/ PULM; No chest pain shortness of breath cough, syncope,edema  change in exercise tolerance. GI /GU: No adominal pain, vomiting, change in bowel habits. No blood in the stool. No significant GU symptoms. SKIN/HEME: ,no acute skin rashes suspicious lesions or bleeding. No lymphadenopathy, nodules, masses.  NEURO/ PSYCH:  No neurologic signs such as weakness numbness. No depression anxiety. IMM/ Allergy: No unusual infections.  Allergy .   REST of 12 system review negative except as per HPI   Past Medical History  Diagnosis Date  . Hx of abnormal cervical Pap smear     used cryo when first married and pregnant  . Sinusitis   .  Hyperlipidemia   . Allergic rhinitis   . Depression   . UTI (lower urinary tract infection)   . Hyperglycemia     Family History  Problem Relation Age of Onset  . Heart attack Brother     age 70  . Diabetes type II    . Hyperlipidemia    . Factor V Leiden deficiency Daughter     Also husband    History   Social History  . Marital Status: Married    Spouse Name: N/A    Number of Children: N/A  . Years of Education: N/A   Social History Main Topics  . Smoking status: Former Games developer  . Smokeless tobacco: None  . Alcohol Use: 0.6 oz/week    1 Glasses of wine per week  . Drug Use: No  . Sexual Activity: None   Other Topics Concern  . None   Social History Narrative   Occupation: 40 per week 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  [employer]    Married   Regular exercise- yes   HH of 2-  girls home from college   Pet dogs   G2P2    Outpatient Encounter Prescriptions as of 11/08/2014  Medication Sig  . amphetamine-dextroamphetamine (ADDERALL XR) 25 MG 24 hr capsule Take 1 capsule by mouth every morning.  Marland Kitchen amphetamine-dextroamphetamine (ADDERALL XR) 25 MG 24 hr capsule Take 1 capsule by mouth every morning.  Marland Kitchen amphetamine-dextroamphetamine (  ADDERALL XR) 25 MG 24 hr capsule Take 1 capsule by mouth every morning.  Marland Kitchen atorvastatin (LIPITOR) 40 MG tablet TAKE 1 TABLET EVERY DAY  . calcium-vitamin D (OSCAL WITH D) 250-125 MG-UNIT per tablet Take 2 tablets by mouth daily.    Marland Kitchen FLUoxetine (PROZAC) 20 MG capsule TAKE ONE CAPSULE EVERY DAY  . MULTIPLE VITAMIN PO Take by mouth.    . naproxen (NAPROSYN) 500 MG tablet TAKE 1 TABLET BY MOUTH TWICE A DAY WITH A MEAL AS NEEDED FOR PAIN  . ranitidine (ZANTAC) 150 MG tablet Take 1 tablet (150 mg total) by mouth 2 (two) times daily.  . [DISCONTINUED] amphetamine-dextroamphetamine (ADDERALL XR) 25 MG 24 hr capsule Take 1 capsule by mouth every morning.  . [DISCONTINUED] amphetamine-dextroamphetamine (ADDERALL XR) 25 MG 24 hr capsule Take 25 mg by mouth every morning.   . [DISCONTINUED] amphetamine-dextroamphetamine (ADDERALL XR) 25 MG 24 hr capsule Take 25 mg by mouth every morning.  . [DISCONTINUED] ranitidine (ZANTAC) 150 MG tablet   . [DISCONTINUED] amphetamine-dextroamphetamine (ADDERALL XR) 20 MG 24 hr capsule Take 1 capsule (20 mg total) by mouth daily.    EXAM:  BP 122/78 mmHg  Temp(Src) 98.7 F (37.1 C) (Oral)  Ht 5' 6.25" (1.683 m)  Wt 245 lb 11.2 oz (111.449 kg)  BMI 39.35 kg/m2  Body mass index is 39.35 kg/(m^2).  Physical Exam: Vital signs reviewed NWG:NFAO is a well-developed well-nourished alert cooperative    who appearsr stated age in no acute distress.  HEENT: normocephalic atraumatic , Eyes: PERRL EOM's full, conjunctiva clear, Nares: paten,t no deformity discharge or tenderness., Ears: no deformity EAC's clear TMs with normal landmarks. Mouth: clear OP, no lesions, edema.  Moist mucous membranes. Dentition in adequate repair. NECK: supple without masses, thyromegaly or bruits. CHEST/PULM:  Clear to auscultation and percussion breath sounds equal no wheeze , rales or rhonchi. No chest wall deformities or tenderness. Breast: normal by inspection . No dimpling, discharge, masses, tenderness or discharge . CV: PMI is nondisplaced, S1 S2 no gallops, murmurs, rubs. Peripheral pulses are full without delay.No JVD .  ABDOMEN: Bowel sounds normal nontender  No guard or rebound, no hepato splenomegal no CVA tenderness.  No hernia. Extremtities:  No clubbing cyanosis or edema, no acute joint swelling or redness no focal atrophy NEURO:  Oriented x3, cranial nerves 3-12 appear to be intact, no obvious focal weakness,gait within normal limits no abnormal reflexes or asymmetrical SKIN: No acute rashes normal turgor, color, no bruising or petechiae. PSYCH: Oriented, good eye contact, no obvious depression anxiety, cognition and judgment appear normal. LN: no cervical axillary inguinal adenopathy  Lab Results  Component Value Date   WBC 6.8  11/01/2014   HGB 13.0 11/01/2014   HCT 39.0 11/01/2014   PLT 259.0 11/01/2014   GLUCOSE 102* 11/01/2014   CHOL 152 11/01/2014   TRIG 286.0* 11/01/2014   HDL 36.70* 11/01/2014   LDLDIRECT 88.6 11/01/2014   LDLCALC 63 04/29/2014   ALT 26 11/01/2014   AST 24 11/01/2014   NA 141 11/01/2014   K 4.0 11/01/2014   CL 108 11/01/2014   CREATININE 1.0 11/01/2014   BUN 12 11/01/2014   CO2 22 11/01/2014   TSH 2.06 11/01/2014   HGBA1C 5.5 04/29/2014    ASSESSMENT AND PLAN:  Discussed the following assessment and plan:  Visit for preventive health examination  Attention deficit hyperactivity disorder (ADHD), combined type - cont same med   Medication management  HYPERLIPIDEMIA - inc TG chck lipids 6 mos  -  Plan: Amb ref to Medical Nutrition Therapy-MNT  Perimenopausal  Sleep disorder breathing - sever snore poss Upper airway resistance  ? need for sleep study - Plan: Ambulatory referral to Pulmonology  Obesity - disc strategies  Patient Care Team: Madelin HeadingsWanda K Panosh, MD as PCP - General Elmon ElseKaren Gould, MD (Dermatology) Charna ElizabethJyothi Mann, MD as Consulting Physician (Gastroenterology) Patient Instructions  Tracking can help behavior.  And activity  Can do referral for snoring and sleep  Also  To dietician . For triglycerides and weight.    Food Choices to Lower Your Triglycerides  Triglycerides are a type of fat in your blood. High levels of triglycerides can increase the risk of heart disease and stroke. If your triglyceride levels are high, the foods you eat and your eating habits are very important. Choosing the right foods can help lower your triglycerides.  WHAT GENERAL GUIDELINES DO I NEED TO FOLLOW?  Lose weight if you are overweight.   Limit or avoid alcohol.   Fill one half of your plate with vegetables and green salads.   Limit fruit to two servings a day. Choose fruit instead of juice.   Make one fourth of your plate whole grains. Look for the word "whole" as the first  word in the ingredient list.  Fill one fourth of your plate with lean protein foods.  Enjoy fatty fish (such as salmon, mackerel, sardines, and tuna) three times a week.   Choose healthy fats.   Limit foods high in starch and sugar.  Eat more home-cooked food and less restaurant, buffet, and fast food.  Limit fried foods.  Cook foods using methods other than frying.  Limit saturated fats.  Check ingredient lists to avoid foods with partially hydrogenated oils (trans fats) in them. WHAT FOODS CAN I EAT?  Grains Whole grains, such as whole wheat or whole grain breads, crackers, cereals, and pasta. Unsweetened oatmeal, bulgur, barley, quinoa, or brown rice. Corn or whole wheat flour tortillas.  Vegetables Fresh or frozen vegetables (raw, steamed, roasted, or grilled). Green salads. Fruits All fresh, canned (in natural juice), or frozen fruits. Meat and Other Protein Products Ground beef (85% or leaner), grass-fed beef, or beef trimmed of fat. Skinless chicken or [place]key. Ground chicken or [place]. Pork trimmed of fat. All fish and seafood. Eggs. Dried beans, peas, or lentils. Unsalted nuts or seeds. Unsalted canned or dry beans. Dairy Low-fat dairy products, such as skim or 1% milk, 2% or reduced-fat cheeses, low-fat ricotta or cottage cheese, or plain low-fat yogurt. Fats and Oils Tub margarines without trans fats. Light or reduced-fat mayonnaise and salad dressings. Avocado. Safflower, olive, or canola oils. Natural peanut or almond butter. The items listed above may not be a complete list of recommended foods or beverages. Contact your dietitian for more options. WHAT FOODS ARE NOT RECOMMENDED?  Grains White bread. White pasta. White rice. Cornbread. Bagels, pastries, and croissants. Crackers that contain trans fat. Vegetables White potatoes. Corn. Creamed or fried vegetables. Vegetables in a cheese sauce. Fruits Dried fruits. Canned fruit in light or heavy syrup. Fruit  juice. Meat and Other Protein Products Fatty cuts of meat. Ribs, chicken wings, bacon, sausage, bologna, salami, chitterlings, fatback, hot dogs, bratwurst, and packaged luncheon meats. Dairy Whole or 2% milk, cream, half-and-half, and cream cheese. Whole-fat or sweetened yogurt. Full-fat cheeses. Nondairy creamers and whipped toppings. Processed cheese, cheese spreads, or cheese curds. Sweets and Desserts Corn syrup, sugars, honey, and molasses. Candy. Jam and jelly. Syrup. Sweetened cereals. Cookies, pies, cakes, donuts,  muffins, and ice cream. Fats and Oils Butter, stick margarine, lard, shortening, ghee, or bacon fat. Coconut, palm kernel, or palm oils. Beverages Alcohol. Sweetened drinks (such as sodas, lemonade, and fruit drinks or punches). The items listed above may not be a complete list of foods and beverages to avoid. Contact your dietitian for more information. Document Released: 10/04/2004 Document Revised: 12/22/2013 Document Reviewed: 10/21/2013 [facility] Patient Information 2015 [place], [place]. This information is not intended to replace advice [place] to you by your health care provider. Make sure you discuss any questions you have with your health care provider.     Neta MendsWanda K. Panosh M.D.

## 2014-11-23 ENCOUNTER — Encounter: Payer: BC Managed Care – PPO | Attending: Internal Medicine | Admitting: Dietician

## 2014-11-23 ENCOUNTER — Encounter: Payer: Self-pay | Admitting: Dietician

## 2014-11-23 DIAGNOSIS — Z713 Dietary counseling and surveillance: Secondary | ICD-10-CM | POA: Diagnosis not present

## 2014-11-23 DIAGNOSIS — Z6839 Body mass index (BMI) 39.0-39.9, adult: Secondary | ICD-10-CM | POA: Insufficient documentation

## 2014-11-23 DIAGNOSIS — E669 Obesity, unspecified: Secondary | ICD-10-CM | POA: Insufficient documentation

## 2014-11-23 NOTE — Patient Instructions (Addendum)
-  Work on W[employer] W[employer]ley Jr[employer] Companypre-planning meals and eating more in the early part of the day  -Boil several eggs at one time  -Make a menu   -Keep a grocery list on your phone  -Pre portion leftovers  -Have popcorn bucket filled halfway at the movies  -Have a high-protein breakfast (take your meds at this time)  -Set an alarm  -Consider seeing a counselor for emotional eating  -Consider using smaller plates and bowls  Healthy weight loss: 1-2 lbs a week Goal: <200 lbs *Make realistic short-term goals  80/20 rule   "Mindless Eating" by Lynnell GrainBrian Wansink

## 2014-11-23 NOTE — Progress Notes (Signed)
  Medical Nutrition Therapy:  Appt start time: 1050 end time:  1150.   Assessment:  Primary concerns today: Victoria Carter is here today to discuss her weight. She has tried many diets in the past. She had gestational diabetes with her 1st pregnancy. She reports her weight has always fluctuated. 2 years ago she was exercising for about an hour a day and kept weight off until she stopped exercising. Victoria Carter works from home from 7am-5pm for an [employer] company. She has 2 adult daughters and lives with her husband. She states that her husband is very healthy. She has ADD, and takes Adderall. Goes to the movies about 1x a week and eats popcorn. She considers herself an emotional eater and likes "eating in the dark" and when she is sad. She doesn't sleep well and will be seeing a pulmonologist to consider sleep study. Does not sleep through the night.   Preferred Learning Style:   No preference indicated   Learning Readiness:   Contemplating   MEDICATIONS: see list   DIETARY INTAKE:  Avoided foods include coffee, Brussles Sprouts.    24-hr recall:  B ( AM): usually skips  Snk ( AM):   L (2 PM): leftovers or Big Mac and salad Snk ( PM):  D ( PM): pot roast "too much" Snk ( PM): not usually, sometimes ice cream  Beverages: sweet wine, occasionally diet soda, water ("not enough")  Usual physical activity: mows lawn 1-2x a week, raking leaves  Estimated energy needs: 1600-1800 calories 180-200 g carbohydrates  Progress Towards Goal(s):  In progress.   Nutritional Diagnosis:  [place]-3.3 Overweight/obesity As related to large portion sizes, excessive energy intake, erratic eating pattern, and physical inactivity.  As evidenced by BMI above 35.    Intervention:  Nutrition counseling provided. Goals: -Work on pre-planning meals and eating more in the early part of the day  -Boil several eggs at one time  -Make a menu   -Keep a grocery list on your phone  -Pre portion leftovers -Have popcorn  bucket filled halfway at the movies -Have a high-protein breakfast (take your meds at this time)  -Set an alarm -Consider seeing a counselor for emotional eating -Consider using smaller plates and bowls Healthy weight loss: 1-2 lbs a week Goal: <200 lbs *Make realistic short-term goals 80/20 rule "Mindless Eating" by Lynnell GrainBrian Wansink  Teaching Method Utilized:  Visual Auditory  Handouts [place] during visit include:  Counseling services in [place]  Therapists in [place]  15g CHO + protein snacks  Meal planning card  Barriers to learning/adherence to lifestyle change: emotional eating  Demonstrated degree of understanding via:  Teach Back   Monitoring/Evaluation:  Dietary intake, exercise, and body weight in 1 month(s).

## 2014-12-13 ENCOUNTER — Other Ambulatory Visit: Payer: Self-pay | Admitting: Internal Medicine

## 2014-12-13 NOTE — Telephone Encounter (Signed)
Sent to the pharmacy by e-scribe. 

## 2014-12-14 ENCOUNTER — Institutional Professional Consult (permissible substitution): Payer: BC Managed Care – PPO | Admitting: Pulmonary Disease

## 2014-12-21 ENCOUNTER — Ambulatory Visit: Payer: BC Managed Care – PPO | Admitting: Dietician

## 2014-12-23 ENCOUNTER — Encounter: Payer: Self-pay | Admitting: Pulmonary Disease

## 2014-12-27 ENCOUNTER — Institutional Professional Consult (permissible substitution): Payer: BC Managed Care – PPO | Admitting: Pulmonary Disease

## 2015-01-24 ENCOUNTER — Ambulatory Visit: Payer: Self-pay | Admitting: Internal Medicine

## 2015-01-27 ENCOUNTER — Encounter: Payer: Self-pay | Admitting: Internal Medicine

## 2015-01-28 NOTE — Telephone Encounter (Signed)
Can try adderall xr 30 mg 1 po qd disp 30  OV in 3-4 weeks before runs out to assess response.

## 2015-01-31 MED ORDER — AMPHETAMINE-DEXTROAMPHET ER 30 MG PO CP24: 30.0000 mg | ORAL_CAPSULE | ORAL | Status: DC

## 2015-03-01 ENCOUNTER — Encounter: Payer: Self-pay | Admitting: Internal Medicine

## 2015-03-01 ENCOUNTER — Ambulatory Visit (INDEPENDENT_AMBULATORY_CARE_PROVIDER_SITE_OTHER): Payer: BLUE CROSS/BLUE SHIELD | Admitting: Internal Medicine

## 2015-03-01 VITALS — BP 116/80 | Temp 98.1°F | Ht 66.5 in | Wt 215.9 lb

## 2015-03-01 DIAGNOSIS — F902 Attention-deficit hyperactivity disorder, combined type: Secondary | ICD-10-CM

## 2015-03-01 DIAGNOSIS — Z79899 Other long term (current) drug therapy: Secondary | ICD-10-CM | POA: Diagnosis not present

## 2015-03-01 DIAGNOSIS — R7303 Prediabetes: Secondary | ICD-10-CM

## 2015-03-01 DIAGNOSIS — Z6841 Body Mass Index (BMI) 40.0 and over, adult: Secondary | ICD-10-CM

## 2015-03-01 DIAGNOSIS — R7309 Other abnormal glucose: Secondary | ICD-10-CM

## 2015-03-01 DIAGNOSIS — E782 Mixed hyperlipidemia: Secondary | ICD-10-CM

## 2015-03-01 MED ORDER — AMPHETAMINE-DEXTROAMPHET ER 20 MG PO CP24: 20.0000 mg | ORAL_CAPSULE | Freq: Every day | ORAL | Status: DC

## 2015-03-01 MED ORDER — AMPHETAMINE-DEXTROAMPHET ER 20 MG PO CP24: 40.0000 mg | ORAL_CAPSULE | Freq: Every day | ORAL | Status: DC

## 2015-03-01 NOTE — Progress Notes (Signed)
Pre visit review using our clinic review tool, if applicable. No additional management support is needed unless otherwise documented below in the visit note.  Chief Complaint  Patient presents with  . Follow-up  . ADHD    eating    HPI: Victoria Carter a.ge comes in today for fu number of issues : Counseling  Every other  Week   Doing better  TG elevated saw nutritionist referred to counselor psych dr Cyndia Skeeters about eating disorders Snoring sleep     Getting better   And heart burn better   With 35 #Weight loss  Did get to sleep caus was going to be later and now seem to be better ADD increase to 30 xr and now staying in seat  But mind still  wonders .  30 mg  Taking less 25 left over  on weekends  Just  Changed jobs different division .  May help  Stress and work   Socially  Still an issue . Still interrupts  A lot.  ROS: See pertinent positives and negatives per HPI.  Past Medical History  Diagnosis Date  . Hx of abnormal cervical Pap smear     used cryo when first married and pregnant  . Sinusitis   . Hyperlipidemia   . Allergic rhinitis   . Depression   . UTI (lower urinary tract infection)   . Hyperglycemia     Family History  Problem Relation Age of Onset  . Heart attack Brother     age 61  . Diabetes type II    . Hyperlipidemia    . Factor V Leiden deficiency Daughter     Also husband    History   Social History  . Marital Status: Married    Spouse Name: N/A  . Number of Children: N/A  . Years of Education: N/A   Social History Main Topics  . Smoking status: Former Games developer  . Smokeless tobacco: Not on file  . Alcohol Use: 0.6 oz/week    1 Glasses of wine per week  . Drug Use: No  . Sexual Activity: Not on file   Other Topics Concern  . None   Social History Narrative   Occupation: 40 per week 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  [employer]    Married   Regular exercise- yes   HH of 2-  girls home from college   Pet dogs   G2P2    Outpatient Encounter Prescriptions as of  03/01/2015  Medication Sig  . atorvastatin (LIPITOR) 40 MG tablet TAKE 1 TABLET BY MOUTH EVERY DAY  . calcium-vitamin D (OSCAL WITH D) 250-125 MG-UNIT per tablet Take 2 tablets by mouth daily.    Marland Kitchen FLUoxetine (PROZAC) 20 MG capsule TAKE ONE CAPSULE BY MOUTH EVERY DAY  . ranitidine (ZANTAC) 150 MG tablet Take 1 tablet (150 mg total) by mouth 2 (two) times daily.  . [DISCONTINUED] amphetamine-dextroamphetamine (ADDERALL XR) 30 MG 24 hr capsule Take 1 capsule (30 mg total) by mouth every morning.  Marland Kitchen amphetamine-dextroamphetamine (ADDERALL XR) 20 MG 24 hr capsule Take 2 capsules (40 mg total) by mouth daily.  . [DISCONTINUED] amphetamine-dextroamphetamine (ADDERALL XR) 20 MG 24 hr capsule Take 1 capsule (20 mg total) by mouth daily.  . [DISCONTINUED] MULTIPLE VITAMIN PO Take by mouth.    . [DISCONTINUED] naproxen (NAPROSYN) 500 MG tablet TAKE 1 TABLET BY MOUTH TWICE A DAY WITH A MEAL AS NEEDED FOR PAIN    EXAM:  BP 116/80 mmHg  Temp(Src) 98.1 F (36.7  C) (Oral)  Ht 5' 6.5" (1.689 m)  Wt 215 lb 14.4 oz (97.932 kg)  BMI 34.33 kg/m2  Body mass index is 34.33 kg/(m^2).  GENERAL: vitals reviewed and listed above, alert, oriented, appears well hydrated and in no acute distress HEENT: atraumatic, conjunctiva  clear, no obvious abnormalities on inspection of external nose and earsCV: HRRR, no clubbing cyanosis or  peripheral edema nl cap refill  MS: moves all extremities without noticeable focal  abnormality PSYCH: pleasant and cooperative, no obvious depression or anxiety some increase motor activity but looks well emotional when disc her pet.  Lab Results  Component Value Date   WBC 6.8 11/01/2014   HGB 13.0 11/01/2014   HCT 39.0 11/01/2014   PLT 259.0 11/01/2014   GLUCOSE 102* 11/01/2014   CHOL 152 11/01/2014   TRIG 286.0* 11/01/2014   HDL 36.70* 11/01/2014   LDLDIRECT 88.6 11/01/2014   LDLCALC 63 04/29/2014   ALT 26 11/01/2014   AST 24 11/01/2014   NA 141 11/01/2014   K 4.0  11/01/2014   CL 108 11/01/2014   CREATININE 1.0 11/01/2014   BUN 12 11/01/2014   CO2 22 11/01/2014   TSH 2.06 11/01/2014   HGBA1C 5.5 04/29/2014    ASSESSMENT AND PLAN:  Discussed the following assessment and plan:  Medication management  Attention deficit hyperactivity disorder (ADHD), combined type - disc optinos cost vs se can try 40 mg per day disc poss se  call in 1 month consider concerta other vyvanse too expensive  BMI 40.0-44.9, adultresolved - resolved and now down to  34   Pre-diabetes - weight loss should help   ge a1c pre cpx visit  HYPERLIPIDEMIA - elevaetd tg will check at cpx Wt Readings from Last 3 Encounters:  03/01/15 215 lb 14.4 oz (97.932 kg)  11/08/14 245 lb 11.2 oz (111.449 kg)  08/27/14 249 lb (112.946 kg)   Total visit 25mins > 50% spent counseling and coordinating care     -Patient advised to return or notify health care team  if symptoms worsen ,persist or new concerns arise.  Patient Instructions  Continue lifestyle intervention healthy eating and exercise . And counseling  Monitor sleep weight loss  . Can try to increase to 40 mg per day .  ROV in 2-3 months         Wanda K. Panosh M.D.

## 2015-03-01 NOTE — Patient Instructions (Signed)
Continue lifestyle intervention healthy eating and exercise . And counseling  Monitor sleep weight loss  . Can try to increase to 40 mg per day .  ROV in 2-3 months

## 2015-03-30 ENCOUNTER — Other Ambulatory Visit: Payer: Self-pay

## 2015-03-30 ENCOUNTER — Other Ambulatory Visit: Payer: Self-pay | Admitting: Internal Medicine

## 2015-03-30 ENCOUNTER — Telehealth: Payer: Self-pay | Admitting: Internal Medicine

## 2015-03-30 DIAGNOSIS — N63 Unspecified lump in unspecified breast: Secondary | ICD-10-CM

## 2015-03-30 NOTE — Telephone Encounter (Signed)
Pt needs new rx generic adderall xr 20 mg °

## 2015-03-30 NOTE — Telephone Encounter (Signed)
Spoke to the pt.  She stated the new strength is doing much better.  Now taking two 20 mg daily.  Has a follow up visit scheduled on 05/09/2015.  Please advise.  Thanks!

## 2015-03-30 NOTE — Telephone Encounter (Signed)
Ok to rx adderall xr 20 mg  Take 2 po qd disp 60

## 2015-03-31 MED ORDER — AMPHETAMINE-DEXTROAMPHET ER 20 MG PO CP24: 40.0000 mg | ORAL_CAPSULE | Freq: Every day | ORAL | Status: DC

## 2015-03-31 NOTE — Telephone Encounter (Signed)
LMOM for the pt to pick up rx at the front desk.

## 2015-04-04 ENCOUNTER — Ambulatory Visit
Admission: RE | Admit: 2015-04-04 | Discharge: 2015-04-04 | Disposition: A | Discharge status: Home / Self Care | Payer: Self-pay | Source: Ambulatory Visit | Attending: Internal Medicine | Admitting: Internal Medicine

## 2015-04-04 DIAGNOSIS — N63 Unspecified lump in unspecified breast: Secondary | ICD-10-CM

## 2015-05-02 ENCOUNTER — Other Ambulatory Visit (INDEPENDENT_AMBULATORY_CARE_PROVIDER_SITE_OTHER): Payer: BLUE CROSS/BLUE SHIELD

## 2015-05-02 ENCOUNTER — Encounter: Payer: Self-pay | Admitting: *Deleted

## 2015-05-02 DIAGNOSIS — R7303 Prediabetes: Secondary | ICD-10-CM

## 2015-05-02 DIAGNOSIS — R7309 Other abnormal glucose: Secondary | ICD-10-CM | POA: Diagnosis not present

## 2015-05-02 DIAGNOSIS — E785 Hyperlipidemia, unspecified: Secondary | ICD-10-CM

## 2015-05-02 LAB — LIPID PANEL
Cholesterol: 148 mg/dL (ref 0–200)
HDL: 44.2 mg/dL (ref >39.00)
LDL Cholesterol: 85 mg/dL (ref 0–99)
NonHDL: 103.8
Total CHOL/HDL Ratio: 3
Triglycerides: 95 mg/dL (ref 0.0–149.0)
VLDL: 19 mg/dL (ref 0.0–40.0)

## 2015-05-02 LAB — HEMOGLOBIN A1C: Hgb A1c MFr Bld: 5.7 % (ref 4.6–6.5)

## 2015-05-02 NOTE — Addendum Note (Signed)
Addended by: Alfred LevinsWYRICK, CINDY D on: 05/02/2015 08:18 AM   Modules accepted: Orders

## 2015-05-09 ENCOUNTER — Ambulatory Visit (INDEPENDENT_AMBULATORY_CARE_PROVIDER_SITE_OTHER): Payer: BLUE CROSS/BLUE SHIELD | Admitting: Internal Medicine

## 2015-05-09 ENCOUNTER — Encounter: Payer: Self-pay | Admitting: Internal Medicine

## 2015-05-09 VITALS — BP 110/72 | Temp 97.9°F | Ht 66.5 in | Wt 203.4 lb

## 2015-05-09 DIAGNOSIS — Z79899 Other long term (current) drug therapy: Secondary | ICD-10-CM | POA: Diagnosis not present

## 2015-05-09 DIAGNOSIS — E785 Hyperlipidemia, unspecified: Secondary | ICD-10-CM

## 2015-05-09 DIAGNOSIS — F902 Attention-deficit hyperactivity disorder, combined type: Secondary | ICD-10-CM

## 2015-05-09 DIAGNOSIS — R7301 Impaired fasting glucose: Secondary | ICD-10-CM | POA: Diagnosis not present

## 2015-05-09 MED ORDER — AMPHETAMINE-DEXTROAMPHET ER 20 MG PO CP24: 40.0000 mg | ORAL_CAPSULE | Freq: Every day | ORAL | Status: DC

## 2015-05-09 NOTE — Progress Notes (Signed)
Pre visit review using our clinic review tool, if applicable. No additional management support is needed unless otherwise documented below in the visit note.  Chief Complaint  Patient presents with  . Follow-up    lipid adhd meds    HPI: Victoria Carter 50 y.o. comesin for fu of  A number of issues  LIPIDS  Taking 40 atorva nose  alos cont lsi  ADHD: better on 2- 20mg  in am  Still has to reign herself in  Mood on prozac no change no depression Energy much better since weight loss ROS: See pertinent positives and negatives per HPI. No cp sob swims 45 minutes   Past Medical History  Diagnosis Date  . Hx of abnormal cervical Pap smear     used cryo when first married and pregnant  . Sinusitis   . Hyperlipidemia   . Allergic rhinitis   . Depression   . UTI (lower urinary tract infection)   . Hyperglycemia     Family History  Problem Relation Age of Onset  . Heart attack Brother     age 51  . Diabetes type II    . Hyperlipidemia    . Factor V Leiden deficiency Daughter     Also husband    History   Social History  . Marital Status: Married    Spouse Name: N/A  . Number of Children: N/A  . Years of Education: N/A   Social History Main Topics  . Smoking status: Former Games developermoker  . Smokeless tobacco: Not on file  . Alcohol Use: 0.6 oz/week    1 Glasses of wine per week  . Drug Use: No  . Sexual Activity: Not on file   Other Topics Concern  . Not on file   Social History Narrative   Occupation: 40 per week 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  [employer]    Married   Regular exercise- yes   HH of 2-  girls home from college   Pet dogs   G2P2    Outpatient Prescriptions Prior to Visit  Medication Sig Dispense Refill  . atorvastatin (LIPITOR) 40 MG tablet TAKE 1 TABLET BY MOUTH EVERY DAY 90 tablet 2  . calcium-vitamin D (OSCAL WITH D) 250-125 MG-UNIT per tablet Take 1 tablet by mouth daily.     Marland Kitchen. FLUoxetine (PROZAC) 20 MG capsule TAKE ONE CAPSULE BY MOUTH EVERY DAY 90 capsule 2  .  ranitidine (ZANTAC) 150 MG tablet Take 1 tablet (150 mg total) by mouth 2 (two) times daily. (Patient taking differently: Take 150 mg by mouth 2 (two) times daily as needed. ) 60 tablet 3  . amphetamine-dextroamphetamine (ADDERALL XR) 20 MG 24 hr capsule Take 2 capsules (40 mg total) by mouth daily. 60 capsule 0  . amphetamine-dextroamphetamine (ADDERALL XR) 20 MG 24 hr capsule Take 2 capsules (40 mg total) by mouth daily. 60 capsule 0   No facility-administered medications prior to visit.     EXAM:  BP 110/72 mmHg  Temp(Src) 97.9 F (36.6 C) (Oral)  Ht 5' 6.5" (1.689 m)  Wt 203 lb 6.4 oz (92.262 kg)  BMI 32.34 kg/m2  Body mass index is 32.34 kg/(m^2).  GENERAL: vitals reviewed and listed above, alert, oriented, appears well hydrated and in no acute distress HEENT: atraumatic, conjunctiva  clear, no obvious abnormalities on inspection of external nose and ears OP : no lesion edema or exudate  NECK: no obvious masses on inspection palpation  LUNGS: clear to auscultation bilaterally, no wheezes, rales or rhonchi,  good air movement CV: HRRR, no clubbing cyanosis or  peripheral edema nl cap refill  MS: moves all extremities without noticeable focal  abnormality PSYCH: pleasant and cooperative, no obvious depression or anxiety Lab Results  Component Value Date   WBC 6.8 11/01/2014   HGB 13.0 11/01/2014   HCT 39.0 11/01/2014   PLT 259.0 11/01/2014   GLUCOSE 102* 11/01/2014   CHOL 148 05/02/2015   TRIG 95.0 05/02/2015   HDL 44.20 05/02/2015   LDLDIRECT 88.6 11/01/2014   LDLCALC 85 05/02/2015   ALT 26 11/01/2014   AST 24 11/01/2014   NA 141 11/01/2014   K 4.0 11/01/2014   CL 108 11/01/2014   CREATININE 1.0 11/01/2014   BUN 12 11/01/2014   CO2 22 11/01/2014   TSH 2.06 11/01/2014   HGBA1C 5.7 05/02/2015   Wt Readings from Last 3 Encounters:  05/09/15 203 lb 6.4 oz (92.262 kg)  03/01/15 215 lb 14.4 oz (97.932 kg)  11/08/14 245 lb 11.2 oz (111.449 kg)   BP Readings from Last  3 Encounters:  05/09/15 110/72  03/01/15 116/80  11/08/14 122/78    ASSESSMENT AND PLAN:  Discussed the following assessment and plan:  Attention deficit hyperactivity disorder (ADHD), combined type - benefot more than risk continue medication  Medication management  Hyperlipidemia - at goal  optinon trial to 20 mg although not in 2013 guidelines  before next lab for cpx  Fasting hyperglycemia - satying in better range cont lsi follow yearly  after next labs Feels better   Good result from healthy weight loss   lsi  endouraged to continue . -Patient advised to return or notify health care team  if symptoms worsen ,persist or new concerns arise.  Patient Instructions  Ok to continue   40 mg of adderal .  Continue lifestyle intervention healthy eating and exercise . Doing much better  . Can try 20 mg lipitor .. 3 months pre wellness check .   And can see if lipid panel continues to be at goal ( ldl below 100) . cpx labs and hga1c in 6 months       Neta MendsWanda K. Panosh M.D.

## 2015-05-09 NOTE — Patient Instructions (Addendum)
Ok to continue   40 mg of adderal .  Continue lifestyle intervention healthy eating and exercise . Doing much better  . Can try 20 mg lipitor .. 3 months pre wellness check .   And can see if lipid panel continues to be at goal ( ldl below 100) . cpx labs and hga1c in 6 months

## 2015-05-17 ENCOUNTER — Encounter: Payer: Self-pay | Admitting: Internal Medicine

## 2015-06-07 ENCOUNTER — Other Ambulatory Visit: Payer: Self-pay | Admitting: Internal Medicine

## 2015-06-08 NOTE — Telephone Encounter (Signed)
Sent to the pharmacy by e-scribe. 

## 2015-06-12 ENCOUNTER — Emergency Department (HOSPITAL_COMMUNITY): Payer: BLUE CROSS/BLUE SHIELD

## 2015-06-12 ENCOUNTER — Encounter (HOSPITAL_COMMUNITY): Payer: Self-pay | Admitting: Emergency Medicine

## 2015-06-12 ENCOUNTER — Emergency Department (HOSPITAL_COMMUNITY)
Admission: EM | Admit: 2015-06-12 | Discharge: 2015-06-12 | Disposition: A | Discharge status: Home / Self Care | Payer: BLUE CROSS/BLUE SHIELD | Attending: Emergency Medicine | Admitting: Emergency Medicine

## 2015-06-12 DIAGNOSIS — Y9289 Other specified places as the place of occurrence of the external cause: Secondary | ICD-10-CM | POA: Diagnosis not present

## 2015-06-12 DIAGNOSIS — S4992XA Unspecified injury of left shoulder and upper arm, initial encounter: Secondary | ICD-10-CM | POA: Diagnosis present

## 2015-06-12 DIAGNOSIS — F329 Major depressive disorder, single episode, unspecified: Secondary | ICD-10-CM | POA: Insufficient documentation

## 2015-06-12 DIAGNOSIS — Y9355 Activity, bike riding: Secondary | ICD-10-CM | POA: Insufficient documentation

## 2015-06-12 DIAGNOSIS — S50311A Abrasion of right elbow, initial encounter: Secondary | ICD-10-CM | POA: Diagnosis not present

## 2015-06-12 DIAGNOSIS — S42212A Unspecified displaced fracture of surgical neck of left humerus, initial encounter for closed fracture: Secondary | ICD-10-CM | POA: Insufficient documentation

## 2015-06-12 DIAGNOSIS — E785 Hyperlipidemia, unspecified: Secondary | ICD-10-CM | POA: Insufficient documentation

## 2015-06-12 DIAGNOSIS — Y998 Other external cause status: Secondary | ICD-10-CM | POA: Diagnosis not present

## 2015-06-12 DIAGNOSIS — Z87891 Personal history of nicotine dependence: Secondary | ICD-10-CM | POA: Diagnosis not present

## 2015-06-12 DIAGNOSIS — Z87440 Personal history of urinary (tract) infections: Secondary | ICD-10-CM | POA: Diagnosis not present

## 2015-06-12 DIAGNOSIS — S50312A Abrasion of left elbow, initial encounter: Secondary | ICD-10-CM | POA: Insufficient documentation

## 2015-06-12 DIAGNOSIS — Z8742 Personal history of other diseases of the female genital tract: Secondary | ICD-10-CM | POA: Insufficient documentation

## 2015-06-12 DIAGNOSIS — Z79899 Other long term (current) drug therapy: Secondary | ICD-10-CM | POA: Insufficient documentation

## 2015-06-12 DIAGNOSIS — S42302A Unspecified fracture of shaft of humerus, left arm, initial encounter for closed fracture: Secondary | ICD-10-CM
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
## 2015-06-12 MED ORDER — FENTANYL CITRATE (PF) 100 MCG/2ML IJ SOLN
50.0000 ug | Freq: Once | INTRAMUSCULAR | Status: AC
  Administered 2015-06-12: 50 ug via NASAL
  Filled 2015-06-12: qty 2

## 2015-06-12 MED ORDER — OXYCODONE-ACETAMINOPHEN 5-325 MG PO TABS
2.0000 | ORAL_TABLET | Freq: Once | ORAL | Status: AC
  Administered 2015-06-12: 2 via ORAL
  Filled 2015-06-12: qty 2

## 2015-06-12 MED ORDER — OXYCODONE-ACETAMINOPHEN 5-325 MG PO TABS: 1.0000 | ORAL_TABLET | Freq: Four times a day (QID) | ORAL | Status: DC | PRN

## 2015-06-12 NOTE — Discharge Instructions (Signed)
Humerus Fracture, Treated with Immobilization  The humerus is the large bone in your upper arm. You have a broken (fractured) humerus. These fractures are easily diagnosed with X-rays.  TREATMENT   Simple fractures which will heal without disability are treated with simple immobilization. Immobilization means you will wear a cast, splint, or sling. You have a fracture which will do well with immobilization. The fracture will heal well simply by being held in a good position until it is stable enough to begin range of motion exercises. Do not take part in activities which would further injure your arm.   HOME CARE INSTRUCTIONS    Put ice on the injured area.   Put ice in a plastic bag.   Place a towel between your skin and the bag.   Leave the ice on for 15-20 minutes, 03-04 times a day.   If you have a cast:   Do not scratch the skin under the cast using sharp or pointed objects.   Check the skin around the cast every day. You may put lotion on any red or sore areas.   Keep your cast dry and clean.   If you have a splint:   Wear the splint as directed.   Keep your splint dry and clean.   You may loosen the elastic around the splint if your fingers become numb, tingle, or turn cold or blue.   If you have a sling:   Wear the sling as directed.   Do not put pressure on any part of your cast or splint until it is fully hardened.   Your cast or splint can be protected during bathing with a plastic bag. Do not lower the cast or splint into water.   Only take over-the-counter or prescription medicines for pain, discomfort, or fever as directed by your caregiver.   Do range of motion exercises as instructed by your caregiver.   Follow up as directed by your caregiver. This is very important in order to avoid permanent injury or disability and chronic pain.  SEEK IMMEDIATE MEDICAL CARE IF:    Your skin or nails in the injured arm turn blue or gray.   Your arm feels cold or numb.   You develop severe  pain in the injured arm.   You are having problems with the medicines you were Lease.  MAKE SURE YOU:    Understand these instructions.   Will watch your condition.   Will get help right away if you are not doing well or get worse.  Document Released: 03/25/2001 Document Revised: 03/10/2012 Document Reviewed: 01/31/2011  ExitCare Patient Information 2015 [employer]. This information is not intended to replace advice Hipp to you by your health care provider. Make sure you discuss any questions you have with your health care provider.
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
## 2015-08-30 ENCOUNTER — Other Ambulatory Visit: Payer: Self-pay | Admitting: Internal Medicine

## 2015-08-31 NOTE — Telephone Encounter (Signed)
Sent to the pharmacy by e-scribe.  Pt has upcoming appt in Nov. 

## 2015-09-13 ENCOUNTER — Telehealth: Payer: Self-pay | Admitting: Internal Medicine

## 2015-09-13 NOTE — Telephone Encounter (Signed)
Pt request refill amphetamine-dextroamphetamine (ADDERALL XR) 20 MG 24 hr capsule Pt would like to know if she can get enough to last until her 11/7 cpe?

## 2015-09-14 ENCOUNTER — Telehealth: Payer: Self-pay | Admitting: Internal Medicine

## 2015-09-14 DIAGNOSIS — G4730 Sleep apnea, unspecified: Secondary | ICD-10-CM

## 2015-09-14 MED ORDER — AMPHETAMINE-DEXTROAMPHET ER 20 MG PO CP24: 40.0000 mg | ORAL_CAPSULE | Freq: Every day | ORAL | Status: DC

## 2015-09-14 NOTE — Telephone Encounter (Signed)
Pt cancel her appt last year for sleep consult. Pt would like a referral to pulmonologist

## 2015-09-14 NOTE — Telephone Encounter (Signed)
Pt notified to pick up a 30 day supply at the front desk. 

## 2015-09-14 NOTE — Telephone Encounter (Signed)
Done for one month only  

## 2015-09-16 NOTE — Telephone Encounter (Signed)
Pt is trying to get a referral for sleep study before end of year

## 2015-09-19 NOTE — Telephone Encounter (Signed)
Referral placed in the system. 

## 2015-09-20 ENCOUNTER — Other Ambulatory Visit: Payer: Self-pay

## 2015-09-20 DIAGNOSIS — Z1231 Encounter for screening mammogram for malignant neoplasm of breast: Secondary | ICD-10-CM

## 2015-09-22 ENCOUNTER — Encounter: Payer: Self-pay | Admitting: Pulmonary Disease

## 2015-09-22 ENCOUNTER — Ambulatory Visit (INDEPENDENT_AMBULATORY_CARE_PROVIDER_SITE_OTHER): Payer: BLUE CROSS/BLUE SHIELD | Admitting: Pulmonary Disease

## 2015-09-22 VITALS — BP 121/81 | HR 78 | Ht 67.0 in | Wt 202.0 lb

## 2015-09-22 DIAGNOSIS — G4733 Obstructive sleep apnea (adult) (pediatric): Secondary | ICD-10-CM

## 2015-09-22 DIAGNOSIS — G478 Other sleep disorders: Secondary | ICD-10-CM | POA: Diagnosis not present

## 2015-09-22 DIAGNOSIS — G4730 Sleep apnea, unspecified: Secondary | ICD-10-CM

## 2015-09-22 NOTE — Progress Notes (Signed)
Subjective:    Patient ID: Victoria Carter, female    DOB: [birth year], 50 y.o.   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 She reports a 50 pound weight loss over the last 4 years and significant decrease in her snoring. Her husband has reported restless sleep. She reports frequent nocturnal awakenings after the a.m. and about 3 times a week she cannot fall asleep again. She does report excessive somnolence in the daytime. She denies choking or gasping episodes during sleep and no witnessed apneas Epworth sleepiness score is 12 Bedtime is around 11 PM, sleep latency is minimal but her complaint is that when she wakes up she it is harder to fall asleep again. She sleeps on her stomach with one pillow, snoring is worse on her back, she reports 8-10 nocturnal awakenings, there are times she will wake up around 3 AM and sit in a chair downstairs until 6 AM. She wakes up feeling tired with dryness of mouth and without headaches There is no history suggestive of cataplexy, sleep paralysis or parasomnias  She has been on Adderall since 1 year with significant improvements in her attention She has been on Prozac tapering doses for many years   Past Medical History  Diagnosis Date  . Hx of abnormal cervical Pap smear     used cryo when first married and pregnant  . Sinusitis   . Hyperlipidemia   . Allergic rhinitis   . Depression   . UTI (lower urinary tract infection)   . Hyperglycemia     Past Surgical History  Procedure Laterality Date  . Breast biopsy  1987  . Cesarean section    . Cervical biopsy      No Known Allergies  Social History   Social History  . Marital Status: Married    Spouse Name: N/A  . Number of Children: N/A  . Years of Education: N/A   Occupational History  . Not on file.   Social History Main Topics  . Smoking status: Former Games developer  . Smokeless tobacco: Not on file    . Alcohol Use: 0.6 oz/week    1 Glasses of wine per week  . Drug Use: No  . Sexual Activity: Not on file   Other Topics Concern  . Not on file   Social History Narrative   Occupation: 40 per week 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  [employer]    Married   Regular exercise- yes   HH of 2-  girls home from college   Pet dogs   G2P2    Family History  Problem Relation Age of Onset  . Heart attack Brother     age 62  . Diabetes type II    . Hyperlipidemia    . Factor V Leiden deficiency Daughter     Also husband     Review of Systems neg for any significant sore throat, dysphagia, itching, sneezing, nasal congestion or excess/ purulent secretions, fever, chills, sweats, unintended wt loss, pleuritic or exertional cp, hempoptysis, orthopnea pnd or change in chronic leg swelling. Also denies presyncope, palpitations, heartburn, abdominal pain, nausea, vomiting, diarrhea or change in bowel or urinary habits, dysuria,hematuria, rash, arthralgias, visual complaints, headache, numbness weakness or ataxia.     Objective:   Physical Exam  Gen. Pleasant, obese, in no distress, normal affect ENT - no lesions, no post nasal drip, class 2-3 airway Neck: No JVD, no thyromegaly, no carotid bruits Lungs: no  use of accessory muscles, no dullness to percussion, decreased without rales or rhonchi  Cardiovascular: Rhythm regular, heart sounds  normal, no murmurs or gallops, no peripheral edema Abdomen: soft and non-tender, no hepatosplenomegaly, BS normal. Musculoskeletal: No deformities, no cyanosis or clubbing Neuro:  alert, non focal, no tremors        Assessment & Plan:

## 2015-09-22 NOTE — Assessment & Plan Note (Signed)
The pathophysiology of obstructive sleep apnea , it's cardiovascular consequences & modes of treatment including CPAP were discused with the patient in detail & they evidenced understanding. Since she has no comorbidities, a home sleep study will be scheduled. She does not seem to have significant symptoms of restless legs. Unclear if her early morning awakenings may be related to depression or medication such as Adderall

## 2015-09-22 NOTE — Progress Notes (Signed)
   Subjective:    Patient ID: Victoria Carter, female    DOB: [birth year], 50 y.o.   MRN: [id number]  HPI    Review of Systems  Constitutional: Negative for fever, chills and unexpected weight change.  HENT: Negative for congestion, dental problem, ear pain, nosebleeds, postnasal drip, rhinorrhea, sinus pressure, sneezing, sore throat, trouble swallowing and voice change.   Eyes: Negative for visual disturbance.  Respiratory: Negative for cough, choking and shortness of breath.   Cardiovascular: Negative for chest pain and leg swelling.  Gastrointestinal: Negative for vomiting, abdominal pain and diarrhea.  Genitourinary: Negative for difficulty urinating.  Musculoskeletal: Negative for arthralgias.  Skin: Negative for rash.  Neurological: Negative for tremors, syncope and headaches.  Hematological: Does not bruise/bleed easily.       Objective:   Physical Exam        Assessment & Plan:

## 2015-09-22 NOTE — Patient Instructions (Addendum)
You may have obstructive sleep apnea  Home sleep study We discussed CPAP & dental device as treatment options

## 2015-10-10 DIAGNOSIS — G4733 Obstructive sleep apnea (adult) (pediatric): Secondary | ICD-10-CM | POA: Diagnosis not present

## 2015-10-12 ENCOUNTER — Telehealth: Payer: Self-pay | Admitting: Internal Medicine

## 2015-10-12 NOTE — Telephone Encounter (Signed)
Pt request refill  amphetamine-dextroamphetamine (ADDERALL XR) 20 MG 24 hr capsule Pt would like to pick up on 10/17

## 2015-10-13 MED ORDER — AMPHETAMINE-DEXTROAMPHET ER 20 MG PO CP24: 40.0000 mg | ORAL_CAPSULE | Freq: Every day | ORAL | Status: DC

## 2015-10-13 NOTE — Telephone Encounter (Signed)
Pt notified to pick up at the front desk. 

## 2015-10-13 NOTE — Telephone Encounter (Signed)
Ok to refill x 1   # 60  Next appt 10/31/2015

## 2015-10-17 ENCOUNTER — Ambulatory Visit
Admission: RE | Admit: 2015-10-17 | Discharge: 2015-10-17 | Disposition: A | Discharge status: Home / Self Care | Payer: BLUE CROSS/BLUE SHIELD | Source: Ambulatory Visit

## 2015-10-17 DIAGNOSIS — Z1231 Encounter for screening mammogram for malignant neoplasm of breast: Secondary | ICD-10-CM

## 2015-10-19 ENCOUNTER — Telehealth: Payer: Self-pay | Admitting: Pulmonary Disease

## 2015-10-19 DIAGNOSIS — G4733 Obstructive sleep apnea (adult) (pediatric): Secondary | ICD-10-CM

## 2015-10-19 NOTE — Telephone Encounter (Signed)
Moderate sleep apnea - AHI 18/hr CPAP Titration Study needed

## 2015-10-19 NOTE — Telephone Encounter (Signed)
lmtcb for pt.  

## 2015-10-20 ENCOUNTER — Other Ambulatory Visit: Payer: Self-pay | Admitting: *Deleted

## 2015-10-20 DIAGNOSIS — G4733 Obstructive sleep apnea (adult) (pediatric): Secondary | ICD-10-CM

## 2015-10-20 NOTE — Telephone Encounter (Signed)
CPAP Titration study ordered Left message on voicemail for pt to call back.

## 2015-10-20 NOTE — Telephone Encounter (Signed)
Patient returned Elise's call.

## 2015-10-20 NOTE — Telephone Encounter (Signed)
Called and spoke with pt. Reviewed recs from below. Pt voiced understanding and had no further questions.

## 2015-10-20 NOTE — Telephone Encounter (Signed)
Patient Returned call [phone]

## 2015-10-20 NOTE — Telephone Encounter (Signed)
Called pt, states she was in a meeting and would have to call us back.  Will await call back.

## 2015-10-31 ENCOUNTER — Other Ambulatory Visit (INDEPENDENT_AMBULATORY_CARE_PROVIDER_SITE_OTHER): Payer: BLUE CROSS/BLUE SHIELD

## 2015-10-31 DIAGNOSIS — Z0000 Encounter for general adult medical examination without abnormal findings: Secondary | ICD-10-CM

## 2015-10-31 LAB — CBC WITH DIFFERENTIAL/PLATELET
Basophils Absolute: 0 10*3/uL (ref 0.0–0.1)
Basophils Relative: 0.5 % (ref 0.0–3.0)
Eosinophils Absolute: 0.1 10*3/uL (ref 0.0–0.7)
Eosinophils Relative: 1.9 % (ref 0.0–5.0)
HCT: 39.2 % (ref 36.0–46.0)
Hemoglobin: 12.9 g/dL (ref 12.0–15.0)
Lymphocytes Relative: 44.4 % (ref 12.0–46.0)
Lymphs Abs: 1.9 10*3/uL (ref 0.7–4.0)
MCHC: 33 g/dL (ref 30.0–36.0)
MCV: 89.3 fl (ref 78.0–100.0)
Monocytes Absolute: 0.4 10*3/uL (ref 0.1–1.0)
Monocytes Relative: 9.3 % (ref 3.0–12.0)
Neutro Abs: 1.9 10*3/uL (ref 1.4–7.7)
Neutrophils Relative %: 43.9 % (ref 43.0–77.0)
Platelets: 272 10*3/uL (ref 150.0–400.0)
RBC: 4.38 Mil/uL (ref 3.87–5.11)
RDW: 13.7 % (ref 11.5–15.5)
WBC: 4.2 10*3/uL (ref 4.0–10.5)

## 2015-10-31 LAB — LIPID PANEL
Cholesterol: 190 mg/dL (ref 0–200)
HDL: 55.6 mg/dL (ref >39.00)
LDL Cholesterol: 120 mg/dL — ABNORMAL HIGH (ref 0–99)
NonHDL: 134.47
Total CHOL/HDL Ratio: 3
Triglycerides: 70 mg/dL (ref 0.0–149.0)
VLDL: 14 mg/dL (ref 0.0–40.0)

## 2015-10-31 LAB — HEPATIC FUNCTION PANEL
ALT: 20 U/L (ref 0–35)
AST: 25 U/L (ref 0–37)
Albumin: 4.1 g/dL (ref 3.5–5.2)
Alkaline Phosphatase: 57 U/L (ref 39–117)
Bilirubin, Direct: 0 mg/dL (ref 0.0–0.3)
Total Bilirubin: 0.3 mg/dL (ref 0.2–1.2)
Total Protein: 6.6 g/dL (ref 6.0–8.3)

## 2015-10-31 LAB — BASIC METABOLIC PANEL
BUN: 18 mg/dL (ref 6–23)
CO2: 29 mEq/L (ref 19–32)
Calcium: 9.3 mg/dL (ref 8.4–10.5)
Chloride: 105 mEq/L (ref 96–112)
Creatinine, Ser: 0.92 mg/dL (ref 0.40–1.20)
GFR: 68.25 mL/min (ref >60.00)
Glucose, Bld: 102 mg/dL — ABNORMAL HIGH (ref 70–99)
Potassium: 4.3 mEq/L (ref 3.5–5.1)
Sodium: 141 mEq/L (ref 135–145)

## 2015-10-31 LAB — TSH: TSH: 3.68 u[IU]/mL (ref 0.35–4.50)

## 2015-10-31 LAB — HEMOGLOBIN A1C: Hgb A1c MFr Bld: 5.9 % (ref 4.6–6.5)

## 2015-11-07 ENCOUNTER — Other Ambulatory Visit (HOSPITAL_COMMUNITY)
Admission: RE | Admit: 2015-11-07 | Discharge: 2015-11-07 | Disposition: A | Discharge status: Home / Self Care | Payer: BLUE CROSS/BLUE SHIELD | Source: Ambulatory Visit | Attending: Internal Medicine | Admitting: Internal Medicine

## 2015-11-07 ENCOUNTER — Encounter: Payer: Self-pay | Admitting: Internal Medicine

## 2015-11-07 ENCOUNTER — Ambulatory Visit: Payer: BLUE CROSS/BLUE SHIELD | Admitting: Internal Medicine

## 2015-11-07 ENCOUNTER — Ambulatory Visit (INDEPENDENT_AMBULATORY_CARE_PROVIDER_SITE_OTHER): Payer: BLUE CROSS/BLUE SHIELD | Admitting: Internal Medicine

## 2015-11-07 VITALS — BP 114/70 | Temp 99.0°F | Ht 66.25 in | Wt 205.4 lb

## 2015-11-07 DIAGNOSIS — Z0000 Encounter for general adult medical examination without abnormal findings: Secondary | ICD-10-CM | POA: Diagnosis not present

## 2015-11-07 DIAGNOSIS — Z01419 Encounter for gynecological examination (general) (routine) without abnormal findings: Secondary | ICD-10-CM | POA: Diagnosis not present

## 2015-11-07 DIAGNOSIS — Z1151 Encounter for screening for human papillomavirus (HPV): Secondary | ICD-10-CM | POA: Insufficient documentation

## 2015-11-07 DIAGNOSIS — F902 Attention-deficit hyperactivity disorder, combined type: Secondary | ICD-10-CM

## 2015-11-07 DIAGNOSIS — Z79899 Other long term (current) drug therapy: Secondary | ICD-10-CM | POA: Diagnosis not present

## 2015-11-07 DIAGNOSIS — E669 Obesity, unspecified: Secondary | ICD-10-CM | POA: Diagnosis not present

## 2015-11-07 DIAGNOSIS — Z8249 Family history of ischemic heart disease and other diseases of the circulatory system: Secondary | ICD-10-CM

## 2015-11-07 DIAGNOSIS — E785 Hyperlipidemia, unspecified: Secondary | ICD-10-CM | POA: Diagnosis not present

## 2015-11-07 MED ORDER — AMPHETAMINE-DEXTROAMPHET ER 20 MG PO CP24: 40.0000 mg | ORAL_CAPSULE | Freq: Every day | ORAL | Status: DC

## 2015-11-07 MED ORDER — AMPHETAMINE-DEXTROAMPHET ER 25 MG PO CP24: ORAL_CAPSULE | ORAL | Status: DC

## 2015-11-07 MED ORDER — AMPHETAMINE-DEXTROAMPHET ER 20 MG PO CP24: ORAL_CAPSULE | ORAL | Status: DC

## 2015-11-07 NOTE — Patient Instructions (Addendum)
Intensify lifestyle interventions. As you.  Are planning   Get repeat lipid panel and HIV aby screen in December .   If all ok then rov in 6 months for med check .   Health Maintenance, Female Adopting a healthy lifestyle and getting preventive care can go a long way to promote health and wellness. Talk with your health care provider about what schedule of regular examinations is right for you. This is a good chance for you to check in with your provider about disease prevention and staying healthy. In between checkups, there are plenty of things you can do on your own. Experts have done a lot of research about which lifestyle changes and preventive measures are most likely to keep you healthy. Ask your health care provider for more information. WEIGHT AND DIET  Eat a healthy diet  Be sure to include plenty of vegetables, fruits, low-fat dairy products, and lean protein.  Do not eat a lot of foods high in solid fats, added sugars, or salt.  Get regular exercise. This is one of the most important things you can do for your health.  Most adults should exercise for at least 150 minutes each week. The exercise should increase your heart rate and make you sweat (moderate-intensity exercise).  Most adults should also do strengthening exercises at least twice a week. This is in addition to the moderate-intensity exercise.  Maintain a healthy weight  Body mass index (BMI) is a measurement that can be used to identify possible weight problems. It estimates body fat based on height and weight. Your health care provider can help determine your BMI and help you achieve or maintain a healthy weight.  For females 51 years of age and older:   A BMI below 18.5 is considered underweight.  A BMI of 18.5 to 24.9 is normal.  A BMI of 25 to 29.9 is considered overweight.  A BMI of 30 and above is considered obese.  Watch levels of cholesterol and blood lipids  You should start having your blood  tested for lipids and cholesterol at 51 years of age, then have this test every 5 years.  You may need to have your cholesterol levels checked more often if:  Your lipid or cholesterol levels are high.  You are older than 51 years of age.  You are at high risk for heart disease.  CANCER SCREENING   Lung Cancer  Lung cancer screening is recommended for adults 51-24 years old who are at high risk for lung cancer because of a history of smoking.  A yearly low-dose CT scan of the lungs is recommended for people who:  Currently smoke.  Have quit within the past 15 years.  Have at least a 30-pack-year history of smoking. A pack year is smoking an average of one pack of cigarettes a day for 1 year.  Yearly screening should continue until it has been 15 years since you quit.  Yearly screening should stop if you develop a health problem that would prevent you from having lung cancer treatment.  Breast Cancer  Practice breast self-awareness. This means understanding how your breasts normally appear and feel.  It also means doing regular breast self-exams. Let your health care provider know about any changes, no matter how small.  If you are in your 20s or 30s, you should have a clinical breast exam (CBE) by a health care provider every 1-3 years as part of a regular health exam.  If you are 40 or  older, have a CBE every year. Also consider having a breast X-ray (mammogram) every year.  If you have a family history of breast cancer, talk to your health care provider about genetic screening.  If you are at high risk for breast cancer, talk to your health care provider about having an MRI and a mammogram every year.  Breast cancer gene (BRCA) assessment is recommended for women who have family members with BRCA-related cancers. BRCA-related cancers include:  Breast.  Ovarian.  Tubal.  Peritoneal cancers.  Results of the assessment will determine the need for genetic counseling  and BRCA1 and BRCA2 testing. Cervical Cancer Your health care provider may recommend that you be screened regularly for cancer of the pelvic organs (ovaries, uterus, and vagina). This screening involves a pelvic examination, including checking for microscopic changes to the surface of your cervix (Pap test). You may be encouraged to have this screening done every 3 years, beginning at age 51.  For women ages 51-65, health care providers may recommend pelvic exams and Pap testing every 3 years, or they may recommend the Pap and pelvic exam, combined with testing for human papilloma virus (HPV), every 5 years. Some types of HPV increase your risk of cervical cancer. Testing for HPV may also be done on women of any age with unclear Pap test results.  Other health care providers may not recommend any screening for nonpregnant women who are considered low risk for pelvic cancer and who do not have symptoms. Ask your health care provider if a screening pelvic exam is right for you.  If you have had past treatment for cervical cancer or a condition that could lead to cancer, you need Pap tests and screening for cancer for at least 20 years after your treatment. If Pap tests have been discontinued, your risk factors (such as having a new sexual partner) need to be reassessed to determine if screening should resume. Some women have medical problems that increase the chance of getting cervical cancer. In these cases, your health care provider may recommend more frequent screening and Pap tests. Colorectal Cancer  This type of cancer can be detected and often prevented.  Routine colorectal cancer screening usually begins at 51 years of age and continues through 51 years of age.  Your health care provider may recommend screening at an earlier age if you have risk factors for colon cancer.  Your health care provider may also recommend using home test kits to check for hidden blood in the stool.  A small camera  at the end of a tube can be used to examine your colon directly (sigmoidoscopy or colonoscopy). This is done to check for the earliest forms of colorectal cancer.  Routine screening usually begins at age 51.  Direct examination of the colon should be repeated every 5-10 years through 51 years of age. However, you may need to be screened more often if early forms of precancerous polyps or small growths are found. Skin Cancer  Check your skin from head to toe regularly.  Tell your health care provider about any new moles or changes in moles, especially if there is a change in a mole's shape or color.  Also tell your health care provider if you have a mole that is larger than the size of a pencil eraser.  Always use sunscreen. Apply sunscreen liberally and repeatedly throughout the day.  Protect yourself by wearing long sleeves, pants, a wide-brimmed hat, and sunglasses whenever you are outside. HEART DISEASE, DIABETES,  AND HIGH BLOOD PRESSURE   High blood pressure causes heart disease and increases the risk of stroke. High blood pressure is more likely to develop in:  People who have blood pressure in the high end of the normal range (130-139/85-89 mm Hg).  People who are overweight or obese.  People who are African American.  If you are 13-46 years of age, have your blood pressure checked every 3-5 years. If you are 61 years of age or older, have your blood pressure checked every year. You should have your blood pressure measured twice--once when you are at a hospital or clinic, and once when you are not at a hospital or clinic. Record the average of the two measurements. To check your blood pressure when you are not at a hospital or clinic, you can use:  An automated blood pressure machine at a pharmacy.  A home blood pressure monitor.  If you are between 79 years and 66 years old, ask your health care provider if you should take aspirin to prevent strokes.  Have regular diabetes  screenings. This involves taking a blood sample to check your fasting blood sugar level.  If you are at a normal weight and have a low risk for diabetes, have this test once every three years after 51 years of age.  If you are overweight and have a high risk for diabetes, consider being tested at a younger age or more often. PREVENTING INFECTION  Hepatitis B  If you have a higher risk for hepatitis B, you should be screened for this virus. You are considered at high risk for hepatitis B if:  You were born in a country where hepatitis B is common. Ask your health care provider which countries are considered high risk.  Your parents were born in a high-risk country, and you have not been immunized against hepatitis B (hepatitis B vaccine).  You have HIV or AIDS.  You use needles to inject street drugs.  You live with someone who has hepatitis B.  You have had sex with someone who has hepatitis B.  You get hemodialysis treatment.  You take certain medicines for conditions, including cancer, organ transplantation, and autoimmune conditions. Hepatitis C  Blood testing is recommended for:  Everyone born from [birth year] through 1966.  Anyone with known risk factors for hepatitis C. Sexually transmitted infections (STIs)  You should be screened for sexually transmitted infections (STIs) including gonorrhea and chlamydia if:  You are sexually active and are younger than 51 years of age.  You are older than 51 years of age and your health care provider tells you that you are at risk for this type of infection.  Your sexual activity has changed since you were last screened and you are at an increased risk for chlamydia or gonorrhea. Ask your health care provider if you are at risk.  If you do not have HIV, but are at risk, it may be recommended that you take a prescription medicine daily to prevent HIV infection. This is called pre-exposure prophylaxis (PrEP). You are considered at risk  if:  You are sexually active and do not regularly use condoms or know the HIV status of your partner(s).  You take drugs by injection.  You are sexually active with a partner who has HIV. Talk with your health care provider about whether you are at high risk of being infected with HIV. If you choose to begin PrEP, you should first be tested for HIV. You should then be  tested every 3 months for as long as you are taking PrEP.  PREGNANCY   If you are premenopausal and you may become pregnant, ask your health care provider about preconception counseling.  If you may become pregnant, take 400 to 800 micrograms (mcg) of folic acid every day.  If you want to prevent pregnancy, talk to your health care provider about birth control (contraception). OSTEOPOROSIS AND MENOPAUSE   Osteoporosis is a disease in which the bones lose minerals and strength with aging. This can result in serious bone fractures. Your risk for osteoporosis can be identified using a bone density scan.  If you are 48 years of age or older, or if you are at risk for osteoporosis and fractures, ask your health care provider if you should be screened.  Ask your health care provider whether you should take a calcium or vitamin D supplement to lower your risk for osteoporosis.  Menopause may have certain physical symptoms and risks.  Hormone replacement therapy may reduce some of these symptoms and risks. Talk to your health care provider about whether hormone replacement therapy is right for you.  HOME CARE INSTRUCTIONS   Schedule regular health, dental, and eye exams.  Stay current with your immunizations.   Do not use any tobacco products including cigarettes, chewing tobacco, or electronic cigarettes.  If you are pregnant, do not drink alcohol.  If you are breastfeeding, limit how much and how often you drink alcohol.  Limit alcohol intake to no more than 1 drink per day for nonpregnant women. One drink equals 12  ounces of beer, 5 ounces of wine, or 1 ounces of hard liquor.  Do not use street drugs.  Do not share needles.  Ask your health care provider for help if you need support or information about quitting drugs.  Tell your health care provider if you often feel depressed.  Tell your health care provider if you have ever been abused or do not feel safe at home.   This information is not intended to replace advice [place] to you by your health care provider. Make sure you discuss any questions you have with your health care provider.   Document Released: 07/02/2011 Document Revised: 01/07/2015 Document Reviewed: 11/18/2013 Elsevier Interactive Patient Education [employer].

## 2015-11-07 NOTE — Progress Notes (Signed)
Chief Complaint  Patient presents with  . Annual Exam    medication management  . ADHD    HPI: Patient  Victoria Carter  50 y.o. comes in today for Preventive Health Care visit   Saw dr Collene Mares for rectal bled colonoscopy eval hx of polyps   adhd med seems to be helping   No sig se . Taking most day work    Staying on  fluoxteine denies sig depression at this time  losing weight with healthy eating and feels better  More energy but inc recently from obligations [facility]  On statin and no reported se   Had labs done per dr Collene Mares and in range   gerd ranitidine  [facility]  Topic Date Due  . Hepatitis C Screening  Nov 24, 1965  . HIV Screening  04/06/1979  . INFLUENZA VACCINE  07/31/2016  . MAMMOGRAM  10/16/2017  . PAP SMEAR  11/06/2018  . TETANUS/TDAP  06/11/2022  . COLONOSCOPY  11/14/2022   Health Maintenance Review LIFESTYLE:  Exercise:  y Tobacco/ETS:n Alcohol: so sog2 per week Sugar beverages:limit Sleep:yest Drug use: no  ROS:  GEN/ HEENT: No fever, significant weight changes sweats headaches vision problems hearing changes, CV/ PULM; No chest pain shortness of breath cough, syncope,edema  change in exercise tolerance. GI /GU: No adominal pain, vomiting, change in bowel habits. No blood in the stool. No significant GU symptoms. SKIN/HEME: ,no acute skin rashes suspicious lesions or bleeding. No lymphadenopathy, nodules, masses.  NEURO/ PSYCH:  No neurologic signs such as weakness numbness. No depression anxiety. IMM/ Allergy: No unusual infections.  Allergy .   REST of 12 system review negative except as per HPI   Past Medical History  Diagnosis Date  . Hx of abnormal cervical Pap smear     used cryo when first married and pregnant  . Sinusitis   . Hyperlipidemia   . Allergic rhinitis   . Depression   . UTI (lower urinary tract infection)   . Hyperglycemia     Past Surgical History  Procedure Laterality Date  . Breast biopsy  1987  .  Cesarean section    . Cervical biopsy      Family History  Problem Relation Age of Onset  . Heart attack Brother     age 50  . Diabetes type II    . Hyperlipidemia    . Factor V Leiden deficiency Daughter     Also husband    Social History   Social History  . Marital Status: Married    Spouse Name: N/A  . Number of Children: N/A  . Years of Education: N/A   Social History Main Topics  . Smoking status: Former Research scientist (life sciences)  . Smokeless tobacco: None  . Alcohol Use: 0.6 oz/week    1 Glasses of wine per week  . Drug Use: No  . Sexual Activity: Not Asked   Other Topics Concern  . None   Social History Narrative   Occupation: 40 per week 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  [employer]    Married   Regular exercise- yes   HH of 2-  girls home from college   Pet dogs   G2P2    Outpatient Prescriptions Prior to Visit  Medication Sig Dispense Refill  . atorvastatin (LIPITOR) 40 MG tablet TAKE 1 TABLET BY MOUTH EVERY DAY 90 tablet 0  . calcium-vitamin D (OSCAL WITH D) 250-125 MG-UNIT per tablet Take 1 tablet by mouth daily.     Marland Kitchen  FLUoxetine (PROZAC) 20 MG capsule TAKE ONE CAPSULE BY MOUTH EVERY DAY 90 capsule 0  . amphetamine-dextroamphetamine (ADDERALL XR) 20 MG 24 hr capsule Take 2 capsules (40 mg total) by mouth daily. 60 capsule 0  . ranitidine (ZANTAC) 150 MG tablet TAKE 1 TABLET (150 MG TOTAL) BY MOUTH 2 (TWO) TIMES DAILY. (Patient taking differently: TAKE 1 TABLET (150 MG TOTAL) BY MOUTH 2 (TWO) TIMES DAILY PRN) 60 tablet 4  . glucosamine-chondroitin 500-400 MG tablet Take 1 tablet by mouth daily at 8 pm.     No facility-administered medications prior to visit.     EXAM:  BP 114/70 mmHg  Temp(Src) 99 F (37.2 C) (Oral)  Ht 5' 6.25" (1.683 m)  Wt 205 lb 6.4 oz (93.169 kg)  BMI 32.89 kg/m2  Body mass index is 32.89 kg/(m^2).  Physical Exam: Vital signs reviewed KZL:DJTT is a well-developed well-nourished alert cooperative    who appearsr stated age in no acute distress.  HEENT:  normocephalic atraumatic , Eyes: PERRL EOM's full, conjunctiva clear, Nares: paten,t no deformity discharge or tenderness., Ears: no deformity EAC's clear TMs with normal landmarks. Mouth: clear OP, no lesions, edema.  Moist mucous membranes. Dentition in adequate repair. NECK: supple without masses, thyromegaly or bruits. CHEST/PULM:  Clear to auscultation and percussion breath sounds equal no wheeze , rales or rhonchi. No chest wall deformities or tenderness.Breast: normal by inspection . No dimpling, discharge, masses, tenderness or discharge . CV: PMI is nondisplaced, S1 S2 no gallops, murmurs, rubs. Peripheral pulses are full without delay.No JVD .  ABDOMEN: Bowel sounds normal nontender  No guard or rebound, no hepato splenomegal no CVA tenderness.  No hernia. Extremtities:  No clubbing cyanosis or edema, no acute joint swelling or redness no focal atrophy NEURO:  Oriented x3, cranial nerves 3-12 appear to be intact, no obvious focal weakness,gait within normal limits no abnormal reflexes or asymmetrical SKIN: No acute rashes normal turgor, color, no bruising or petechiae. PSYCH: Oriented, good eye contact, no obvious depression anxiety, cognition and judgment appear normal. LN: no cervical axillary inguinal adenopathy Pelvic: NL ext GU, labia clear without lesions or rash . Vagina no lesions .Cervix: clear  UTERUS: Neg CMT Adnexa:  clear no masses . PAP done hi risk hpv   Lab Results  Component Value Date   WBC 4.2 10/31/2015   HGB 12.9 10/31/2015   HCT 39.2 10/31/2015   PLT 272.0 10/31/2015   GLUCOSE 102* 10/31/2015   CHOL 190 10/31/2015   TRIG 70.0 10/31/2015   HDL 55.60 10/31/2015   LDLDIRECT 88.6 11/01/2014   LDLCALC 120* 10/31/2015   ALT 20 10/31/2015   AST 25 10/31/2015   NA 141 10/31/2015   K 4.3 10/31/2015   CL 105 10/31/2015   CREATININE 0.92 10/31/2015   BUN 18 10/31/2015   CO2 29 10/31/2015   TSH 3.68 10/31/2015   HGBA1C 5.9 10/31/2015   Wt Readings from Last 3  Encounters:  11/07/15 205 lb 6.4 oz (93.169 kg)  09/22/15 202 lb (91.627 kg)  06/12/15 199 lb (90.266 kg)   lfts cbc bmp nl tsh 4.17 done oct 16 ASSESSMENT AND PLAN:  Discussed the following assessment and plan:  Visit for preventive health examination - Plan: PAP [[place]]  Encounter for routine gynecological examination - Plan: PAP [[place]]  Attention deficit hyperactivity disorder (ADHD), combined type - continue meds  Medication management  Hyperlipidemia  Obesity  Family history of heart disease in female family member before age 86  Risk  benefit of medications discussed.  Continue  adderall xr  lipitor and fluoxetine  On zantac per dr Collene Mares for gerd  Patient Care Team: Burnis Medin, MD as PCP - General Jari Pigg, MD (Dermatology) Juanita Craver, MD as Consulting Physician (Gastroenterology) Patient Instructions   Intensify lifestyle interventions. As you.  Are planning   Get repeat lipid panel and HIV aby screen in December .   If all ok then rov in 6 months for med check .   Health Maintenance, Female Adopting a healthy lifestyle and getting preventive care can go a long way to promote health and wellness. Talk with your health care provider about what schedule of regular examinations is right for you. This is a good chance for you to check in with your provider about disease prevention and staying healthy. In between checkups, there are plenty of things you can do on your own. Experts have done a lot of research about which lifestyle changes and preventive measures are most likely to keep you healthy. Ask your health care provider for more information. WEIGHT AND DIET  Eat a healthy diet  Be sure to include plenty of vegetables, fruits, low-fat dairy products, and lean protein.  Do not eat a lot of foods high in solid fats, added sugars, or salt.  Get regular exercise. This is one of the most important things you can do for your health.  Most adults  should exercise for at least 150 minutes each week. The exercise should increase your heart rate and make you sweat (moderate-intensity exercise).  Most adults should also do strengthening exercises at least twice a week. This is in addition to the moderate-intensity exercise.  Maintain a healthy weight  Body mass index (BMI) is a measurement that can be used to identify possible weight problems. It estimates body fat based on height and weight. Your health care provider can help determine your BMI and help you achieve or maintain a healthy weight.  For females 17 years of age and older:   A BMI below 18.5 is considered underweight.  A BMI of 18.5 to 24.9 is normal.  A BMI of 25 to 29.9 is considered overweight.  A BMI of 30 and above is considered obese.  Watch levels of cholesterol and blood lipids  You should start having your blood tested for lipids and cholesterol at 51 years of age, then have this test every 5 years.  You may need to have your cholesterol levels checked more often if:  Your lipid or cholesterol levels are high.  You are older than 51 years of age.  You are at high risk for heart disease.  CANCER SCREENING   Lung Cancer  Lung cancer screening is recommended for adults 92-20 years old who are at high risk for lung cancer because of a history of smoking.  A yearly low-dose CT scan of the lungs is recommended for people who:  Currently smoke.  Have quit within the past 15 years.  Have at least a 30-pack-year history of smoking. A pack year is smoking an average of one pack of cigarettes a day for 1 year.  Yearly screening should continue until it has been 15 years since you quit.  Yearly screening should stop if you develop a health problem that would prevent you from having lung cancer treatment.  Breast Cancer  Practice breast self-awareness. This means understanding how your breasts normally appear and feel.  It also means doing regular  breast self-exams. Let your  health care provider know about any changes, no matter how small.  If you are in your 20s or 30s, you should have a clinical breast exam (CBE) by a health care provider every 1-3 years as part of a regular health exam.  If you are 43 or older, have a CBE every year. Also consider having a breast X-ray (mammogram) every year.  If you have a family history of breast cancer, talk to your health care provider about genetic screening.  If you are at high risk for breast cancer, talk to your health care provider about having an MRI and a mammogram every year.  Breast cancer gene (BRCA) assessment is recommended for women who have family members with BRCA-related cancers. BRCA-related cancers include:  Breast.  Ovarian.  Tubal.  Peritoneal cancers.  Results of the assessment will determine the need for genetic counseling and BRCA1 and BRCA2 testing. Cervical Cancer Your health care provider may recommend that you be screened regularly for cancer of the pelvic organs (ovaries, uterus, and vagina). This screening involves a pelvic examination, including checking for microscopic changes to the surface of your cervix (Pap test). You may be encouraged to have this screening done every 3 years, beginning at age 26.  For women ages 64-65, health care providers may recommend pelvic exams and Pap testing every 3 years, or they may recommend the Pap and pelvic exam, combined with testing for human papilloma virus (HPV), every 5 years. Some types of HPV increase your risk of cervical cancer. Testing for HPV may also be done on women of any age with unclear Pap test results.  Other health care providers may not recommend any screening for nonpregnant women who are considered low risk for pelvic cancer and who do not have symptoms. Ask your health care provider if a screening pelvic exam is right for you.  If you have had past treatment for cervical cancer or a condition that  could lead to cancer, you need Pap tests and screening for cancer for at least 20 years after your treatment. If Pap tests have been discontinued, your risk factors (such as having a new sexual partner) need to be reassessed to determine if screening should resume. Some women have medical problems that increase the chance of getting cervical cancer. In these cases, your health care provider may recommend more frequent screening and Pap tests. Colorectal Cancer  This type of cancer can be detected and often prevented.  Routine colorectal cancer screening usually begins at 51 years of age and continues through 52 years of age.  Your health care provider may recommend screening at an earlier age if you have risk factors for colon cancer.  Your health care provider may also recommend using home test kits to check for hidden blood in the stool.  A small camera at the end of a tube can be used to examine your colon directly (sigmoidoscopy or colonoscopy). This is done to check for the earliest forms of colorectal cancer.  Routine screening usually begins at age 34.  Direct examination of the colon should be repeated every 5-10 years through 51 years of age. However, you may need to be screened more often if early forms of precancerous polyps or small growths are found. Skin Cancer  Check your skin from head to toe regularly.  Tell your health care provider about any new moles or changes in moles, especially if there is a change in a mole's shape or color.  Also tell your health care  provider if you have a mole that is larger than the size of a pencil eraser.  Always use sunscreen. Apply sunscreen liberally and repeatedly throughout the day.  Protect yourself by wearing long sleeves, pants, a wide-brimmed hat, and sunglasses whenever you are outside. HEART DISEASE, DIABETES, AND HIGH BLOOD PRESSURE   High blood pressure causes heart disease and increases the risk of stroke. High blood pressure  is more likely to develop in:  People who have blood pressure in the high end of the normal range (130-139/85-89 mm Hg).  People who are overweight or obese.  People who are African American.  If you are 77-7 years of age, have your blood pressure checked every 3-5 years. If you are 66 years of age or older, have your blood pressure checked every year. You should have your blood pressure measured twice--once when you are at a hospital or clinic, and once when you are not at a hospital or clinic. Record the average of the two measurements. To check your blood pressure when you are not at a hospital or clinic, you can use:  An automated blood pressure machine at a pharmacy.  A home blood pressure monitor.  If you are between 66 years and 58 years old, ask your health care provider if you should take aspirin to prevent strokes.  Have regular diabetes screenings. This involves taking a blood sample to check your fasting blood sugar level.  If you are at a normal weight and have a low risk for diabetes, have this test once every three years after 51 years of age.  If you are overweight and have a high risk for diabetes, consider being tested at a younger age or more often. PREVENTING INFECTION  Hepatitis B  If you have a higher risk for hepatitis B, you should be screened for this virus. You are considered at high risk for hepatitis B if:  You were born in a country where hepatitis B is common. Ask your health care provider which countries are considered high risk.  Your parents were born in a high-risk country, and you have not been immunized against hepatitis B (hepatitis B vaccine).  You have HIV or AIDS.  You use needles to inject street drugs.  You live with someone who has hepatitis B.  You have had sex with someone who has hepatitis B.  You get hemodialysis treatment.  You take certain medicines for conditions, including cancer, organ transplantation, and autoimmune  conditions. Hepatitis C  Blood testing is recommended for:  Everyone born from [birth year] through 1966.  Anyone with known risk factors for hepatitis C. Sexually transmitted infections (STIs)  You should be screened for sexually transmitted infections (STIs) including gonorrhea and chlamydia if:  You are sexually active and are younger than 51 years of age.  You are older than 51 years of age and your health care provider tells you that you are at risk for this type of infection.  Your sexual activity has changed since you were last screened and you are at an increased risk for chlamydia or gonorrhea. Ask your health care provider if you are at risk.  If you do not have HIV, but are at risk, it may be recommended that you take a prescription medicine daily to prevent HIV infection. This is called pre-exposure prophylaxis (PrEP). You are considered at risk if:  You are sexually active and do not regularly use condoms or know the HIV status of your partner(s).  You take drugs  by injection.  You are sexually active with a partner who has HIV. Talk with your health care provider about whether you are at high risk of being infected with HIV. If you choose to begin PrEP, you should first be tested for HIV. You should then be tested every 3 months for as long as you are taking PrEP.  PREGNANCY   If you are premenopausal and you may become pregnant, ask your health care provider about preconception counseling.  If you may become pregnant, take 400 to 800 micrograms (mcg) of folic acid every day.  If you want to prevent pregnancy, talk to your health care provider about birth control (contraception). OSTEOPOROSIS AND MENOPAUSE   Osteoporosis is a disease in which the bones lose minerals and strength with aging. This can result in serious bone fractures. Your risk for osteoporosis can be identified using a bone density scan.  If you are 83 years of age or older, or if you are at risk for  osteoporosis and fractures, ask your health care provider if you should be screened.  Ask your health care provider whether you should take a calcium or vitamin D supplement to lower your risk for osteoporosis.  Menopause may have certain physical symptoms and risks.  Hormone replacement therapy may reduce some of these symptoms and risks. Talk to your health care provider about whether hormone replacement therapy is right for you.  HOME CARE INSTRUCTIONS   Schedule regular health, dental, and eye exams.  Stay current with your immunizations.   Do not use any tobacco products including cigarettes, chewing tobacco, or electronic cigarettes.  If you are pregnant, do not drink alcohol.  If you are breastfeeding, limit how much and how often you drink alcohol.  Limit alcohol intake to no more than 1 drink per day for nonpregnant women. One drink equals 12 ounces of beer, 5 ounces of wine, or 1 ounces of hard liquor.  Do not use street drugs.  Do not share needles.  Ask your health care provider for help if you need support or information about quitting drugs.  Tell your health care provider if you often feel depressed.  Tell your health care provider if you have ever been abused or do not feel safe at home.   This information is not intended to replace advice [place] to you by your health care provider. Make sure you discuss any questions you have with your health care provider.   Document Released: 07/02/2011 Document Revised: 01/07/2015 Document Reviewed: 11/18/2013 Elsevier Interactive Patient Education 2016 [place] K. Panosh M.D.

## 2015-11-08 LAB — CYTOLOGY - PAP

## 2015-11-08 NOTE — Progress Notes (Signed)
Quick Note:  Tell patient PAP is normal. HPV high risk is negative ______ 

## 2015-11-16 ENCOUNTER — Other Ambulatory Visit: Payer: Self-pay | Admitting: Internal Medicine

## 2015-11-18 NOTE — Telephone Encounter (Signed)
Sent to the pharmacy by e-scribe. 

## 2015-12-02 ENCOUNTER — Ambulatory Visit (HOSPITAL_BASED_OUTPATIENT_CLINIC_OR_DEPARTMENT_OTHER): Payer: BLUE CROSS/BLUE SHIELD | Attending: Pulmonary Disease

## 2015-12-02 DIAGNOSIS — G4730 Sleep apnea, unspecified: Secondary | ICD-10-CM | POA: Diagnosis present

## 2015-12-02 DIAGNOSIS — I493 Ventricular premature depolarization: Secondary | ICD-10-CM | POA: Diagnosis not present

## 2015-12-02 DIAGNOSIS — R0683 Snoring: Secondary | ICD-10-CM | POA: Insufficient documentation

## 2015-12-02 DIAGNOSIS — G4733 Obstructive sleep apnea (adult) (pediatric): Secondary | ICD-10-CM | POA: Insufficient documentation

## 2015-12-03 ENCOUNTER — Other Ambulatory Visit: Payer: Self-pay | Admitting: Internal Medicine

## 2015-12-06 NOTE — Telephone Encounter (Signed)
Sent to the pharmacy by e-scribe. 

## 2015-12-09 ENCOUNTER — Other Ambulatory Visit: Payer: BLUE CROSS/BLUE SHIELD

## 2015-12-09 ENCOUNTER — Telehealth: Payer: Self-pay | Admitting: Pulmonary Disease

## 2015-12-09 DIAGNOSIS — G4733 Obstructive sleep apnea (adult) (pediatric): Secondary | ICD-10-CM

## 2015-12-09 NOTE — Progress Notes (Signed)
Patient Name: Victoria Carter, Jullie Study Date: 12/02/2015 Gender: Female D.O.B: 02/13/1965 Age (years): [age] Referring Provider: Cyril Mourningakesh Alva MD, ABSM Height (inches): 67 Interpreting Physician: Cyril Mourningakesh Alva MD, ABSM Weight (lbs): 199 RPSGT: Melburn PopperWillard, Susan BMI: 31 MRN: [id number] Neck Size: 16.00   CLINICAL INFORMATION The patient is referred for a CPAP titration to treat sleep apnea. Date of HST: 10/10/15, moderate , AHI 18/h   SLEEP STUDY TECHNIQUE As per the AASM Manual for the Scoring of Sleep and Associated Events v2.3 (April 2016) with a hypopnea requiring 4% desaturations. The channels recorded and monitored were frontal, central and occipital EEG, electrooculogram (EOG), submentalis EMG (chin), nasal and oral airflow, thoracic and abdominal wall motion, anterior tibialis EMG, snore microphone, electrocardiogram, and pulse oximetry. Continuous positive airway pressure (CPAP) was initiated at the beginning of the study and titrated to treat sleep-disordered breathing.   MEDICATIONS Medications taken by the patient : N/A  Medications administered by patient during sleep study : No sleep medicine administered.   RESPIRATORY PARAMETERS Optimal PAP Pressure (cm): 15 AHI at Optimal Pressure (/hr): 0.0 Overall Minimal O2 (%): 90.00 Supine % at Optimal Pressure (%): 0 Minimal O2 at Optimal Pressure (%): 95.0     SLEEP ARCHITECTURE The study was initiated at 10:58:57 PM and ended at 5:02:50 AM. Sleep onset time was 8.0 minutes and the sleep efficiency was 84.5%. The total sleep time was 307.5 minutes. The patient spent 1.30% of the night in stage N1 sleep, 70.57% in stage N2 sleep, 9.76% in stage N3 and 18.37% in REM.Stage REM latency was 101.5 minutes Wake after sleep onset was 48.4. Alpha intrusion was absent. Supine sleep was 69.27%.   CARDIAC DATA The 2 lead EKG demonstrated sinus rhythm. The mean heart rate was 60.45 beats per minute. Other EKG findings include: PVCs.  LEG  MOVEMENT DATA The total Periodic Limb Movements of Sleep (PLMS) were 94. The PLMS index was 18.34. A PLMS index of <15 is considered normal in adults.   IMPRESSIONS - The optimal PAP pressure was 15 cm of water. - Central sleep apnea was not noted during this titration (CAI = 0.0/h). - Significant oxygen desaturations were not observed during this titration (min O2 = 90.00%). - The patient snored with Loud snoring volume during this titration study. - 2-lead EKG demonstrated: PVCs - Mild periodic limb movements were observed during this study. Arousals associated with PLMs were rare.   DIAGNOSIS - Obstructive Sleep Apnea (327.23 [G47.33 ICD-10])   RECOMMENDATIONS - Trial of CPAP therapy on 15 cm H2O with a Medium size Fisher&Paykel Full Face Mask Simplus mask and heated humidification. - Avoid alcohol, sedatives and other CNS depressants that may worsen sleep apnea and disrupt normal sleep architecture. - Sleep hygiene should be reviewed to assess factors that may improve sleep quality. - Weight management and regular exercise should be initiated or continued. - Return to for re-evaluation after 4 weeks of therapy   Cyril Mourningakesh Alva MD. [facility] Hospital[facility]CCP. [facility]

## 2015-12-09 NOTE — Telephone Encounter (Signed)
Send Rx for  -CPAP therapy on 15 cm H2O with a Medium size Fisher&Paykel Full Face Mask Simplus mask and heated humidification  DL in 4 wks  OV in 6 with TP/me

## 2015-12-12 NOTE — Telephone Encounter (Signed)
Called spoke with pt. Aware of below. appt scheduled with TP 2/2 in [place] office. Order placed.

## 2015-12-16 ENCOUNTER — Other Ambulatory Visit: Payer: BLUE CROSS/BLUE SHIELD

## 2015-12-23 ENCOUNTER — Other Ambulatory Visit (INDEPENDENT_AMBULATORY_CARE_PROVIDER_SITE_OTHER): Payer: BLUE CROSS/BLUE SHIELD

## 2015-12-23 DIAGNOSIS — E785 Hyperlipidemia, unspecified: Secondary | ICD-10-CM | POA: Diagnosis not present

## 2015-12-23 DIAGNOSIS — Z1159 Encounter for screening for other viral diseases: Secondary | ICD-10-CM

## 2015-12-23 DIAGNOSIS — Z114 Encounter for screening for human immunodeficiency virus [HIV]: Secondary | ICD-10-CM

## 2015-12-23 LAB — LIPID PANEL
Cholesterol: 183 mg/dL (ref 0–200)
HDL: 56.6 mg/dL (ref >39.00)
LDL Cholesterol: 115 mg/dL — ABNORMAL HIGH (ref 0–99)
NonHDL: 126.72
Total CHOL/HDL Ratio: 3
Triglycerides: 58 mg/dL (ref 0.0–149.0)
VLDL: 11.6 mg/dL (ref 0.0–40.0)

## 2015-12-24 LAB — HIV ANTIBODY (ROUTINE TESTING W REFLEX): HIV 1&2 Ab, 4th Generation: NONREACTIVE

## 2015-12-24 LAB — HEPATITIS C ANTIBODY: HCV Ab: NEGATIVE

## 2015-12-28 ENCOUNTER — Telehealth: Payer: Self-pay | Admitting: Internal Medicine

## 2015-12-28 ENCOUNTER — Encounter (HOSPITAL_BASED_OUTPATIENT_CLINIC_OR_DEPARTMENT_OTHER): Payer: BLUE CROSS/BLUE SHIELD

## 2015-12-28 NOTE — Telephone Encounter (Signed)
Left a message for a return call on listed number.

## 2015-12-28 NOTE — Telephone Encounter (Signed)
Go ahead  and try the 40 mg and see if tolerates this   Since her ldl  Although better is still  Over 100  If feels ok on this then continue 40 mg per day .

## 2015-12-28 NOTE — Telephone Encounter (Signed)
Pt notified of results.  She wants to know if she should continue taking 1/2 tab (20 mg) of Lipitor or take the whole 40 mg.  Please advise.  Thanks!

## 2015-12-28 NOTE — Telephone Encounter (Signed)
Results are good acceptable continue healthy lifestye and meds   Please get her a copy of results  Lab Results  Component Value Date   WBC 4.2 10/31/2015   HGB 12.9 10/31/2015   HCT 39.2 10/31/2015   PLT 272.0 10/31/2015   GLUCOSE 102* 10/31/2015   CHOL 183 12/23/2015   TRIG 58.0 12/23/2015   HDL 56.60 12/23/2015   LDLDIRECT 88.6 11/01/2014   LDLCALC 115* 12/23/2015   ALT 20 10/31/2015   AST 25 10/31/2015   NA 141 10/31/2015   K 4.3 10/31/2015   CL 105 10/31/2015   CREATININE 0.92 10/31/2015   BUN 18 10/31/2015   CO2 29 10/31/2015   TSH 3.68 10/31/2015   HGBA1C 5.9 10/31/2015

## 2015-12-28 NOTE — Telephone Encounter (Signed)
Pt would like blood work results °

## 2015-12-28 NOTE — Telephone Encounter (Signed)
Lab results are in the system but under Dr. Charm RingsK's name.  Not sure how that happened.

## 2015-12-29 NOTE — Telephone Encounter (Addendum)
okt odo this but sig should say take 1/2 ( 40 mg ) po qd  Insurance may not  Let her have 30 per month but can send in anyway

## 2015-12-29 NOTE — Telephone Encounter (Signed)
Spoke to the pt.  Informed her to start taking 40 mg (whole tab) instead of 1/2 tab due to ldl still over 100.  Refills sent in on 12/06/15 for 6 months (90 day supply).

## 2015-12-29 NOTE — Telephone Encounter (Signed)
Pt would like for Dr. Fabian SharpPanosh to prescribe 80 mg tabs to cut in half. 30 day supply (2 months).  Please advise.

## 2015-12-30 ENCOUNTER — Telehealth: Payer: Self-pay | Admitting: Internal Medicine

## 2015-12-30 MED ORDER — ATORVASTATIN CALCIUM 80 MG PO TABS: 40.0000 mg | ORAL_TABLET | Freq: Every day | ORAL | Status: DC

## 2015-12-30 NOTE — Telephone Encounter (Signed)
Sent to the pharmacy by e-scribe. 

## 2015-12-30 NOTE — Telephone Encounter (Signed)
Left a message for a return call.

## 2015-12-30 NOTE — Telephone Encounter (Signed)
Ms. Victoria Carter called saying she needs the refill of Lipitor - 80MG 's x's 30 days sent to her [facility] on [address] today. If you have questions for her, please give her a call.  Pt's ph# [phone] Thank you.

## 2016-01-16 ENCOUNTER — Telehealth: Payer: Self-pay | Admitting: Internal Medicine

## 2016-01-16 NOTE — Telephone Encounter (Signed)
Pt had 3 scripts and the 3rd script has the following amphetamine-dextroamphetamine (ADDERALL XR) 25 MG 24 hr capsule  Pt said she only takes the below script and will bring this script back and pick up the correct one   amphetamine-dextroamphetamine (ADDERALL XR) 20 MG 24 hr capsule

## 2016-01-17 ENCOUNTER — Other Ambulatory Visit: Payer: Self-pay | Admitting: Family Medicine

## 2016-01-17 MED ORDER — AMPHETAMINE-DEXTROAMPHET ER 20 MG PO CP24: 20.0000 mg | ORAL_CAPSULE | ORAL | Status: DC

## 2016-01-17 MED ORDER — AMPHETAMINE-DEXTROAMPHET ER 20 MG PO CP24
20.0000 mg | ORAL_CAPSULE | Freq: Two times a day (BID) | ORAL | Status: DC
Start: 2016-01-17 — End: 2016-02-02

## 2016-01-17 NOTE — Telephone Encounter (Signed)
Pt was [place] three prescriptions on 11/07/2015.  Two were for 20 mg strength and 1 was for 25 mg strength.  The 25 mg strength was a mistake.  New prescription printed for the pt for a 30 day supply of the 20 mg.  Pt notified to pick up at the front desk.

## 2016-02-02 ENCOUNTER — Encounter: Payer: Self-pay | Admitting: Adult Health

## 2016-02-02 ENCOUNTER — Ambulatory Visit (INDEPENDENT_AMBULATORY_CARE_PROVIDER_SITE_OTHER): Payer: BLUE CROSS/BLUE SHIELD | Admitting: Adult Health

## 2016-02-02 VITALS — BP 113/74 | HR 79 | Temp 98.1°F | Ht 67.0 in | Wt 216.0 lb

## 2016-02-02 DIAGNOSIS — G4733 Obstructive sleep apnea (adult) (pediatric): Secondary | ICD-10-CM

## 2016-02-02 DIAGNOSIS — J309 Allergic rhinitis, unspecified: Secondary | ICD-10-CM

## 2016-02-02 DIAGNOSIS — E669 Obesity, unspecified: Secondary | ICD-10-CM | POA: Diagnosis not present

## 2016-02-02 NOTE — Assessment & Plan Note (Signed)
Moderate OSA w/ good control on CPAP  encouarged on compliance  Will adjust  CPAP pressure for comfort. May need to change to auto-set if this does not work.   Plan  Decrease CPAP pressure to 52m H2O .  May use Saline nasal spray Twice daily  As needed   May try Saline nasal gel At bedtime  As needed   Use Zyrtec  At bedtime  As needed  Drainage .  Follow up with Dr. Vassie Loll  In 3 months and As needed

## 2016-02-02 NOTE — Assessment & Plan Note (Signed)
May use Saline nasal spray Twice daily  As needed   May try Saline nasal gel At bedtime  As needed   Use Zyrtec  At bedtime  As needed  Drainage .  Follow up with Dr. Vassie Loll  In 3 months and As needed

## 2016-02-02 NOTE — Progress Notes (Signed)
Subjective:    Patient ID: Victoria Carter, female    DOB: 06-14-1965, 51 y.o.   MRN: [id number]  HPI 51 yo female seen for sleep consult in September 2016 for daytime sleepiness/snoring . She was found to have Moderate OSA on sleep study 10/2015.  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  Seen in September 2016 for sleep consult for snoring and daytime sleepiness.  She had sleep study that showed moderate sleep apnea.  Set up for CPAP titration study that showed optimal pressure at 15cm H2O.  She started CPAP in Dec 2016 .  Since starting she is feeling slightly better w/ less daytime sleepiness.  Says she feels pressure is too high at times. Stops machine and starts over when this happens.  Nose is very stuffy and dried out. Drainage in back of throat.  Download reviewed and shows good compliance (93%) , avg usage 4hr , AHI 2.0 Denies chest pain, orthopnea, edema or fever.      Past Medical History  Diagnosis Date  . Hx of abnormal cervical Pap smear     used cryo when first married and pregnant  . Sinusitis   . Hyperlipidemia   . Allergic rhinitis   . Depression   . UTI (lower urinary tract infection)   . Hyperglycemia    Current Outpatient Prescriptions on File Prior to Visit  Medication Sig Dispense Refill  . amphetamine-dextroamphetamine (ADDERALL XR) 20 MG 24 hr capsule Take 2 capsules (40 mg total) by mouth daily. 60 capsule 0  . atorvastatin (LIPITOR) 80 MG tablet Take 0.5 tablets (40 mg total) by mouth daily. 90 tablet 0  . calcium-vitamin D (OSCAL WITH D) 250-125 MG-UNIT per tablet Take 1 tablet by mouth daily.     Marland Kitchen FLUoxetine (PROZAC) 20 MG capsule TAKE ONE CAPSULE BY MOUTH EVERY DAY 90 capsule 0  . ranitidine (ZANTAC) 150 MG tablet TAKE 1 TABLET BY MOUTH TWICE A DAY (Patient taking differently: TAKE 1 TABLET BY  MOUTH TWICE A DAY AS NEEDED) 60 tablet 5   No current facility-administered medications on file prior to visit.     Review of Systems Constitutional:   No  weight loss, night sweats,  Fevers, chills,  +fatigue, or  lassitude.  HEENT:   No headaches,  Difficulty swallowing,  Tooth/dental problems, or  Sore throat,                No sneezing, itching, ear ache, nasal congestion, post nasal drip,   CV:  No chest pain,  Orthopnea, PND, swelling in lower extremities, anasarca, dizziness, palpitations, syncope.   GI  No heartburn, indigestion, abdominal pain, nausea, vomiting, diarrhea, change in bowel habits, loss of appetite, bloody stools.   Resp: No shortness of breath with exertion or at rest.  No excess mucus, no productive cough,  No non-productive cough,  No coughing up of blood.  No change in color of mucus.  No wheezing.  No chest wall deformity  Skin: no rash or lesions.  GU: no dysuria, change in color of urine, no urgency or frequency.  No flank pain, no hematuria   MS:  No joint pain or swelling.  No decreased range of motion.  No back pain.  Psych:  No change in mood or affect. No depression or anxiety.  No memory loss.         Objective:   Physical Exam Filed Vitals:   02/02/16 0916  BP: 113/74  Pulse: 79  Temp: 98.1 F (36.7 C)  TempSrc: Oral  Height:  (1.702 m)  Weight: 216 lb (97.977 kg)  SpO2: 98%    Body mass index is 33.82 kg/(m^2).   GEN: A/Ox3; pleasant , NAD obese   HEENT:  [place]/AT,  EACs-clear, TMs-wnl, NOSE-clear, THROAT-clear, no lesions, no postnasal drip or exudate noted. Class 2 MP airway   NECK:  Supple w/ fair ROM; no JVD; normal carotid impulses w/o bruits; no thyromegaly or nodules palpated; no lymphadenopathy.  RESP  Clear  P & A; w/o, wheezes/ rales/ or rhonchi.no accessory muscle use, no dullness to percussion  CARD:  RRR, no m/r/g  , no peripheral edema, pulses intact, no cyanosis or clubbing.  GI:   Soft & nt; nml bowel  sounds; no organomegaly or masses detected.  Musco: Warm bil, no deformities or joint swelling noted.   Neuro: alert, no focal deficits noted.    Skin: Warm, no lesions or rashes         Assessment & Plan:

## 2016-02-02 NOTE — Patient Instructions (Signed)
Decrease CPAP pressure to 37m H2O .  May use Saline nasal spray Twice daily  As needed   May try Saline nasal gel At bedtime  As needed   Use Zyrtec  At bedtime  As needed  Drainage .  Follow up with Dr. Vassie Loll  In 3 months and As needed

## 2016-02-02 NOTE — Assessment & Plan Note (Signed)
Wt loss  

## 2016-02-06 NOTE — Progress Notes (Signed)
Reviewed & agree with plan  

## 2016-02-15 ENCOUNTER — Telehealth: Payer: Self-pay | Admitting: Internal Medicine

## 2016-02-15 MED ORDER — AMPHETAMINE-DEXTROAMPHET ER 20 MG PO CP24: ORAL_CAPSULE | ORAL | Status: DC

## 2016-02-15 MED ORDER — AMPHETAMINE-DEXTROAMPHET ER 20 MG PO CP24
40.0000 mg | ORAL_CAPSULE | Freq: Every day | ORAL | Status: DC
Start: 2016-02-15 — End: 2016-05-07

## 2016-02-15 NOTE — Telephone Encounter (Signed)
° ° ° °  Pt request refill of the following: ° ° °amphetamine-dextroamphetamine (ADDERALL XR) 20 MG 24 hr capsule ° ° °Phamacy: °

## 2016-02-15 NOTE — Telephone Encounter (Signed)
Pt notified to pick up at the front desk. 

## 2016-02-15 NOTE — Telephone Encounter (Signed)
Ok to refill  X 3 months  Due to come back in may

## 2016-02-17 ENCOUNTER — Encounter: Payer: Self-pay | Admitting: Adult Health

## 2016-03-16 ENCOUNTER — Telehealth: Payer: Self-pay | Admitting: Internal Medicine

## 2016-03-16 NOTE — Telephone Encounter (Incomplete)
[facility] - Client TELEPHONE ADVICE RECORD [facility]  Patient Name: Victoria Carter  DOB: 10/16/1965    Initial Comment ***Needs second/third attempt** Caller says her husband was just Dx with the flu and they recommended that she gets a prophylactic from her doctor    Nurse Assessment  Nurse: Dorthula RuePatten, RN, Enrique SackKendra Date/Time (Eastern Time): 03/16/2016 1:21:30 PM  Confirm and document reason for call. If symptomatic, describe symptoms. You must click the next button to save text entered. ---Caller states she has the Tamaflu, but she has no symptoms. She is requesting Tamaflu.  Has the patient traveled out of the country within the last 30 days? ---Not Applicable  Does the patient have any new or worsening symptoms? ---Yes  Will a triage be completed? ---Yes  Related visit to physician within the last 2 weeks? ---No  Does the PT have any chronic conditions? (i.e. diabetes, asthma, etc.) ---Yes  List chronic conditions. ---High Cholesterol, Back Issues,  Is the patient pregnant or possibly pregnant? (Ask all females between the ages of 6512-55) ---No  Is this a behavioral health or substance abuse call? ---No     Guidelines    Guideline Title Affirmed Question Affirmed Notes  Influenza Exposure [1]Influenza EXPOSURE (Close Contact) within last 7 days AND [2] NO respiratory symptoms (all triage questions negative)    Final Disposition User   [facility] MoroPatten, RN, Enrique SackKendra    Comments  Caller called back stating that she missed the call from the nurse.   Disagree/Comply: Comply

## 2016-03-16 NOTE — Telephone Encounter (Signed)
Left message for patient to return phone call.  

## 2016-03-19 DIAGNOSIS — G4733 Obstructive sleep apnea (adult) (pediatric): Secondary | ICD-10-CM | POA: Diagnosis not present

## 2016-03-19 NOTE — Telephone Encounter (Signed)
Pt notified that Dr. Fabian SharpPanosh does not give Tamiflu as a prophylaxis.  Instructed her to call if she gets flu like symptoms.  Pt's husband returned to work today.

## 2016-03-29 ENCOUNTER — Ambulatory Visit (INDEPENDENT_AMBULATORY_CARE_PROVIDER_SITE_OTHER): Payer: BLUE CROSS/BLUE SHIELD | Admitting: Family Medicine

## 2016-03-29 ENCOUNTER — Encounter: Payer: Self-pay | Admitting: Family Medicine

## 2016-03-29 VITALS — BP 120/80 | HR 96 | Temp 98.2°F | Wt 215.6 lb

## 2016-03-29 DIAGNOSIS — J029 Acute pharyngitis, unspecified: Secondary | ICD-10-CM | POA: Diagnosis not present

## 2016-03-29 DIAGNOSIS — R05 Cough: Secondary | ICD-10-CM

## 2016-03-29 DIAGNOSIS — J09X2 Influenza due to identified novel influenza A virus with other respiratory manifestations: Secondary | ICD-10-CM | POA: Diagnosis not present

## 2016-03-29 DIAGNOSIS — R059 Cough, unspecified: Secondary | ICD-10-CM

## 2016-03-29 LAB — POCT INFLUENZA A: Rapid Influenza A Ag: POSITIVE

## 2016-03-29 LAB — POCT RAPID STREP A (OFFICE): Rapid Strep A Screen: NEGATIVE

## 2016-03-29 MED ORDER — HYDROCODONE-HOMATROPINE 5-1.5 MG/5ML PO SYRP: 5.0000 mL | ORAL_SOLUTION | Freq: Three times a day (TID) | ORAL | Status: DC | PRN

## 2016-03-29 MED ORDER — OSELTAMIVIR PHOSPHATE 75 MG PO CAPS: 75.0000 mg | ORAL_CAPSULE | Freq: Two times a day (BID) | ORAL | Status: DC

## 2016-03-29 NOTE — Progress Notes (Signed)
Pre visit review using our clinic review tool, if applicable. No additional management support is needed unless otherwise documented below in the visit note. 

## 2016-03-29 NOTE — Patient Instructions (Signed)
Get rest, drink plenty of fluids, and use tylenol or ibuprofen as needed for pain. Follow up if fever >101, if symptoms worsen or if symptoms are not improved in 3 days. Patient verbalizes understanding.   Influenza, Adult Influenza ("the flu") is a viral infection of the respiratory tract. It occurs more often in winter months because people spend more time in close contact with one another. Influenza can make you feel very sick. Influenza easily spreads from person to person (contagious). CAUSES  Influenza is caused by a virus that infects the respiratory tract. You can catch the virus by breathing in droplets from an infected person's cough or sneeze. You can also catch the virus by touching something that was recently contaminated with the virus and then touching your mouth, nose, or eyes. RISKS AND COMPLICATIONS You may be at risk for a more severe case of influenza if you smoke cigarettes, have diabetes, have chronic heart disease (such as heart failure) or lung disease (such as asthma), or if you have a weakened immune system. Elderly people and pregnant women are also at risk for more serious infections. The most common problem of influenza is a lung infection (pneumonia). Sometimes, this problem can require emergency medical care and may be life threatening. SIGNS AND SYMPTOMS  Symptoms typically last 4 to 10 days and may include:  Fever.  Chills.  Headache, body aches, and muscle aches.  Sore throat.  Chest discomfort and cough.  Poor appetite.  Weakness or feeling tired.  Dizziness.  Nausea or vomiting. DIAGNOSIS  Diagnosis of influenza is often made based on your history and a physical exam. A nose or throat swab test can be done to confirm the diagnosis. TREATMENT  In mild cases, influenza goes away on its own. Treatment is directed at relieving symptoms. For more severe cases, your health care provider may prescribe antiviral medicines to shorten the sickness. Antibiotic  medicines are not effective because the infection is caused by a virus, not by bacteria. HOME CARE INSTRUCTIONS  Take medicines only as directed by your health care provider.  Use a cool mist humidifier to make breathing easier.  Get plenty of rest until your temperature returns to normal. This usually takes 3 to 4 days.  Drink enough fluid to keep your urine clear or pale yellow.  Cover yourmouth and nosewhen coughing or sneezing,and wash your handswellto prevent thevirusfrom spreading.  Stay homefromwork orschool untilthe fever is gonefor at least 151full day. PREVENTION  An annual influenza vaccination (flu shot) is the best way to avoid getting influenza. An annual flu shot is now routinely recommended for all adults in the [place] SEEK MEDICAL CARE IF:  You experiencechest pain, yourcough worsens,or you producemore mucus.  Youhave nausea,vomiting, ordiarrhea.  Your fever returns or gets worse. SEEK IMMEDIATE MEDICAL CARE IF:  You havetrouble breathing, you become short of breath,or your skin ornails becomebluish.  You have severe painor stiffnessin the neck.  You develop a sudden headache, or pain in the face or ear.  You have nausea or vomiting that you cannot control. MAKE SURE YOU:   Understand these instructions.  Will watch your condition.  Will get help right away if you are not doing well or get worse.   This information is not intended to replace advice Durnin to you by your health care provider. Make sure you discuss any questions you have with your health care provider.   Document Released: 12/14/2000 Document Revised: 01/07/2015 Document Reviewed: 03/17/2012 Elsevier Interactive Patient Education 2016  [employer].

## 2016-03-29 NOTE — Progress Notes (Signed)
Subjective:    Patient ID: Victoria Carter, female    DOB: 01-25-1965, 51 y.o.   MRN: [id number]  HPI  Victoria Carter is a 51 year old female who presents today acutely ill with congestion, fever noted at home of 100 degrees F., rhinitis with yellow drainage, ear pressure/pain, sore throat, sinus pressure, and productive cough with yellow sputum for two days.  Nasal congestion has been noted for approximately one month with post nasal drip. She denies history of asthma/bronchitis, reflux, recent antibiotic use but states that her husband has recently been sick with similar symptoms. She denies N/V/D and myalgias. Treatments at home include Dayquil and Nyquil with limited benefit.     Review of Systems  Constitutional: Positive for fever and chills.  HENT: Positive for congestion, postnasal drip, rhinorrhea and sore throat.   Respiratory: Positive for cough.   Cardiovascular: Negative for chest pain and palpitations.  Gastrointestinal: Negative for nausea, vomiting, diarrhea and constipation.  Genitourinary: Negative for dysuria, urgency, frequency and flank pain.  Musculoskeletal: Negative for myalgias and back pain.  Skin: Negative for rash.  Allergic/Immunologic: Positive for environmental allergies.  Neurological: Negative for dizziness, light-headedness and headaches.   Past Medical History  Diagnosis Date  . Hx of abnormal cervical Pap smear     used cryo when first married and pregnant  . Sinusitis   . Hyperlipidemia   . Allergic rhinitis   . Depression   . UTI (lower urinary tract infection)   . Hyperglycemia     Social History   Social History  . Marital Status: Married    Spouse Name: N/A  . Number of Children: N/A  . Years of Education: N/A   Occupational History  . Not on file.   Social History Main Topics  . Smoking status: Former Smoker    Quit date: 04/12/2009  . Smokeless tobacco: Not on file  . Alcohol Use: 0.6 oz/week    1 Glasses of wine per week    . Drug Use: No  . Sexual Activity: Not on file   Other Topics Concern  . 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 Breast biopsy  1987  . Cesarean section    . Cervical biopsy      Family History  Problem Relation Age of Onset  . Heart attack Brother     age 63  . Diabetes type II    . Hyperlipidemia    . Factor V Leiden deficiency Daughter     Also husband    No Known Allergies  Current Outpatient Prescriptions on File Prior to Visit  Medication Sig Dispense Refill  . amphetamine-dextroamphetamine (ADDERALL XR) 20 MG 24 hr capsule Take 2 capsules (40 mg total) by mouth daily. 60 capsule 0  . amphetamine-dextroamphetamine (ADDERALL XR) 20 MG 24 hr capsule Take 2 capsules (40 mg total) by mouth daily 60 capsule 0  . amphetamine-dextroamphetamine (ADDERALL XR) 20 MG 24 hr capsule Take 2 capsules (40 mg total) by mouth daily. 60 capsule 0  . atorvastatin (LIPITOR) 80 MG tablet Take 0.5 tablets (40 mg total) by mouth daily. 90 tablet 0  . calcium-vitamin D (OSCAL WITH D) 250-125 MG-UNIT per tablet Take 1 tablet  by mouth daily.     Marland Kitchen. FLUoxetine (PROZAC) 20 MG capsule TAKE ONE CAPSULE BY MOUTH EVERY DAY 90 capsule 0  . ranitidine (ZANTAC) 150 MG tablet TAKE 1 TABLET BY MOUTH TWICE A DAY (Patient taking differently: TAKE 1 TABLET BY MOUTH TWICE A DAY AS NEEDED) 60 tablet 5   No current facility-administered medications on file prior to visit.    BP 120/80 mmHg  Pulse 96  Temp(Src) 98.2 F (36.8 C) (Oral)  Wt 215 lb 9.6 oz (97.796 kg)      Objective:   Physical Exam  Constitutional: She is oriented to person, place, and time. She appears well-developed and well-nourished.  HENT:  Right Ear: Tympanic membrane normal.  Left Ear: Tympanic membrane normal.  Nose: Rhinorrhea  present.  Mouth/Throat: Mucous membranes are normal. No oropharyngeal exudate or posterior oropharyngeal erythema.  Eyes: Pupils are equal, round, and reactive to light.  Neck: Normal range of motion.  Cardiovascular: Normal rate and regular rhythm.   Pulmonary/Chest: Effort normal and breath sounds normal. She has no wheezes. She has no rales.  Abdominal: Soft. Bowel sounds are normal. There is no tenderness.  Lymphadenopathy:    She has cervical adenopathy.  Neurological: She is alert and oriented to person, place, and time.  Skin: Skin is warm and dry. No rash noted.  Psychiatric: She has a normal mood and affect. Her behavior is normal.       Assessment & Plan:  1. Influenza due to identified novel influenza A virus with other respiratory manifestations Advised patient on supportive measures:  Get rest, drink plenty of fluids, and use tylenol or ibuprofen as needed for pain. Follow up if fever >101, if symptoms worsen or if symptoms are not improved in 3-4 days. Patient verbalizes understanding.  Negative strep screen  Advised patient to practice good hand hygiene  - oseltamivir (TAMIFLU) 75 MG capsule; Take 1 capsule (75 mg total) by mouth 2 (two) times daily.  Dispense: 10 capsule; Refill: 0  2. Cough  - HYDROcodone-homatropine (HYCODAN) 5-1.5 MG/5ML syrup; Take 5 mLs by mouth every 8 (eight) hours as needed for cough.  Dispense: 120 mL; Refill: 0

## 2016-04-06 DIAGNOSIS — M545 Low back pain: Secondary | ICD-10-CM | POA: Diagnosis not present

## 2016-04-09 ENCOUNTER — Other Ambulatory Visit: Payer: Self-pay | Admitting: Internal Medicine

## 2016-04-10 NOTE — Telephone Encounter (Signed)
Left a message for a return call.

## 2016-04-10 NOTE — Telephone Encounter (Signed)
Sent to the pharmacy by e-scribe. 

## 2016-04-17 DIAGNOSIS — G4733 Obstructive sleep apnea (adult) (pediatric): Secondary | ICD-10-CM | POA: Diagnosis not present

## 2016-04-19 DIAGNOSIS — G4733 Obstructive sleep apnea (adult) (pediatric): Secondary | ICD-10-CM | POA: Diagnosis not present

## 2016-05-06 NOTE — Progress Notes (Signed)
Pt came in lat for appt so rescheduled same day

## 2016-05-07 ENCOUNTER — Encounter: Payer: Self-pay | Admitting: Internal Medicine

## 2016-05-07 ENCOUNTER — Encounter: Payer: BLUE CROSS/BLUE SHIELD | Admitting: Internal Medicine

## 2016-05-07 ENCOUNTER — Ambulatory Visit (INDEPENDENT_AMBULATORY_CARE_PROVIDER_SITE_OTHER): Payer: BLUE CROSS/BLUE SHIELD | Admitting: Internal Medicine

## 2016-05-07 VITALS — BP 120/78 | HR 84 | Temp 98.5°F | Ht 66.25 in | Wt 224.0 lb

## 2016-05-07 DIAGNOSIS — J309 Allergic rhinitis, unspecified: Secondary | ICD-10-CM

## 2016-05-07 DIAGNOSIS — F902 Attention-deficit hyperactivity disorder, combined type: Secondary | ICD-10-CM

## 2016-05-07 DIAGNOSIS — E785 Hyperlipidemia, unspecified: Secondary | ICD-10-CM | POA: Diagnosis not present

## 2016-05-07 DIAGNOSIS — Z79899 Other long term (current) drug therapy: Secondary | ICD-10-CM | POA: Diagnosis not present

## 2016-05-07 DIAGNOSIS — G4733 Obstructive sleep apnea (adult) (pediatric): Secondary | ICD-10-CM

## 2016-05-07 MED ORDER — AMPHETAMINE-DEXTROAMPHET ER 20 MG PO CP24: 40.0000 mg | ORAL_CAPSULE | Freq: Every day | ORAL | Status: DC

## 2016-05-07 MED ORDER — AMPHETAMINE-DEXTROAMPHET ER 20 MG PO CP24: ORAL_CAPSULE | ORAL | Status: DC

## 2016-05-07 NOTE — Assessment & Plan Note (Signed)
Continue current regimen attend to sleep ls etc stresses to help attention and organization

## 2016-05-07 NOTE — Progress Notes (Signed)
Pre visit review using our clinic review tool, if applicable. No additional management support is needed unless otherwise documented below in the visit note.    Chief Complaint  Patient presents with  . Follow-up    6 mth follow up    HPI: Victoria Carter 51 y.o.  Comes ion for fu of number or problems  ADD ADHD   2 in am   Still may need life coach.  MOOD:   Still taking  LIPIDS fam hx of cv disease: Work late nights  Job     7-8 hours  Sleep when no other issues .  Had flu in februray   Still has ur congestion and pnd and cough at times  Under rx for OSA  Started  cpap in December  .    Allergies  otc zyrtec   And  No incs no fever face pain    ROS: See pertinent positives and negatives per HPI. No cp sob   Past Medical History  Diagnosis Date  . Hx of abnormal cervical Pap smear     used cryo when first married and pregnant  . Sinusitis   . Hyperlipidemia   . Allergic rhinitis   . Depression   . UTI (lower urinary tract infection)   . Hyperglycemia     Family History  Problem Relation Age of Onset  . Heart attack Brother     age 52  . Diabetes type II    . Hyperlipidemia    . Factor V Leiden deficiency Daughter     Also husband    Social History   Social History  . Marital Status: Married    Spouse Name: N/A  . Number of Children: N/A  . Years of Education: N/A   Social History Main Topics  . Smoking status: Former Smoker    Quit date: 04/12/2009  . Smokeless tobacco: None  . Alcohol Use: 0.6 oz/week    1 Glasses of wine per week  . Drug Use: No  . Sexual Activity: Not Asked   Other Topics Concern  . None   Social History Narrative   Occupation: 40 per week 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  [employer]    Married   Regular exercise- yes   HH of 2-  girls home from college   Pet dogs   G2P2    Outpatient Prescriptions Prior to Visit  Medication Sig Dispense Refill  . atorvastatin (LIPITOR) 80 MG tablet Take 0.5 tablets (40 mg total) by mouth daily. 90 tablet 0   . calcium-vitamin D (OSCAL WITH D) 250-125 MG-UNIT per tablet Take 1 tablet by mouth daily.     Marland Kitchen. FLUoxetine (PROZAC) 20 MG capsule TAKE ONE CAPSULE BY MOUTH EVERY DAY 90 capsule 0  . ranitidine (ZANTAC) 150 MG tablet TAKE 1 TABLET BY MOUTH TWICE A DAY (Patient taking differently: TAKE 1 TABLET BY MOUTH TWICE A DAY AS NEEDED) 60 tablet 5  . amphetamine-dextroamphetamine (ADDERALL XR) 20 MG 24 hr capsule Take 2 capsules (40 mg total) by mouth daily. 60 capsule 0  . amphetamine-dextroamphetamine (ADDERALL XR) 20 MG 24 hr capsule Take 2 capsules (40 mg total) by mouth daily 60 capsule 0  . amphetamine-dextroamphetamine (ADDERALL XR) 20 MG 24 hr capsule Take 2 capsules (40 mg total) by mouth daily. 60 capsule 0  . HYDROcodone-homatropine (HYCODAN) 5-1.5 MG/5ML syrup Take 5 mLs by mouth every 8 (eight) hours as needed for cough. 120 mL 0  . oseltamivir (TAMIFLU) 75 MG capsule Take  1 capsule (75 mg total) by mouth 2 (two) times daily. 10 capsule 0   No facility-administered medications prior to visit.     EXAM:  BP 120/78 mmHg  Pulse 84  Temp(Src) 98.5 F (36.9 C) (Oral)  Ht 5' 6.25" (1.683 m)  Wt 224 lb (101.606 kg)  BMI 35.87 kg/m2  SpO2 98%  Body mass index is 35.87 kg/(m^2).  GENERAL: vitals reviewed and listed above, alert, oriented, appears well hydrated and in no acute distress congested  Non toxic ocass cough HEENT: atraumatic, conjunctiva  clear, no obvious abnormalities on inspection of external nose and ears tms clear face nt  Clear drainage OP : no lesion edema or exudate  NECK: no obvious masses on inspection palpation  LUNGS: clear to auscultation bilaterally, no wheezes, rales or rhonchi, t CV: HRRR, no clubbing cyanosis or  peripheral edema nl cap refill  MS: moves all extremities without noticeable focal  abnormality PSYCH: pleasant and cooperative, no obvious depression or anxiety   ASSESSMENT AND PLAN:  Discussed the following assessment and plan:  Attention  deficit hyperactivity disorder (ADHD), combined type  Medication management  Allergic sinusitis - poss additive  post flu dopesn seem bacterail at this time  use incs and call for rx  if helps    Hyperlipidemia - cont no se of med  OSA (obstructive sleep apnea) Expectant management. If not better   -Patient advised to return or notify health care team  if symptoms worsen ,persist or new concerns arise.  Patient Instructions  OTC   flonase or nasacort .  First  For at least 1-2 weeks  To see if helps the allergy sx.  contact us if not didn't   in 2 weeks  Sometimes we use   If getting fever or pface pain unrelenting .  We can do rx  If needed for your flex or hsa  Same medication at this time .        Neta Mends. Panosh M.D.

## 2016-05-07 NOTE — Patient Instructions (Signed)
OTC   flonase or nasacort .  First  For at least 1-2 weeks  To see if helps the allergy sx.  contact us if not didn't   in 2 weeks  Sometimes we use   If getting fever or pface pain unrelenting .  We can do rx  If needed for your flex or hsa  Same medication at this time .

## 2016-05-16 ENCOUNTER — Other Ambulatory Visit: Payer: Self-pay | Admitting: Internal Medicine

## 2016-05-16 NOTE — Telephone Encounter (Signed)
Sent to the pharmacy by e-scribe. 

## 2016-05-19 DIAGNOSIS — G4733 Obstructive sleep apnea (adult) (pediatric): Secondary | ICD-10-CM | POA: Diagnosis not present

## 2016-05-24 ENCOUNTER — Ambulatory Visit (INDEPENDENT_AMBULATORY_CARE_PROVIDER_SITE_OTHER): Payer: BLUE CROSS/BLUE SHIELD | Admitting: Pulmonary Disease

## 2016-05-24 ENCOUNTER — Encounter: Payer: Self-pay | Admitting: Pulmonary Disease

## 2016-05-24 VITALS — BP 116/81 | HR 84 | Ht 67.0 in | Wt 224.0 lb

## 2016-05-24 DIAGNOSIS — G4733 Obstructive sleep apnea (adult) (pediatric): Secondary | ICD-10-CM | POA: Diagnosis not present

## 2016-05-24 NOTE — Assessment & Plan Note (Signed)
CPAP is set at 13 cm and is very effective Try to use at least 6 hours every night If increased snoring, call us and we can change to auto settings  Weight loss encouraged, compliance with goal of at least 4-6 hrs every night is the expectation. Advised against medications with sedative side effects Cautioned against driving when sleepy - understanding that sleepiness will vary on a day to day basis

## 2016-05-24 NOTE — Progress Notes (Signed)
   Subjective:    Patient ID: Victoria Carter, female    DOB: 11/19/1965, 51 y.o.   MRN: [id number]  HPI  51 yo female for FU of OSA. She was found to have Moderate OSA on sleep study 10/2015.  [employer] [facility] for [facility]>     05/24/2016  Chief Complaint  Patient presents with  . Follow-up    doing well on CPAP, pt states she has missed a few days due to being sick or forgets to put on.    She was started on CPAP 15 cm with a full face mask Due to poor tolerance, she was dropped to 13 cm She reports improvement in her daytime fatigue and feels very rested on waking up. She reports good compliance with the machine No pressure issues or dryness. She's gained about 20 pounds since the study and husband has noted occasional snoring  Download was reviewed and shows good control of events with no residuals, no leak, occasional missed days with an average usage more than 4 hours per night  Significant tests/ events  Sleep study 10/2015 >AHI 18/hr - wt 202 CPAP titration 12/2015 >optimal pressure 15cm H20     Review of Systems Patient denies significant dyspnea,cough, hemoptysis,  chest pain, palpitations, pedal edema, orthopnea, paroxysmal nocturnal dyspnea, lightheadedness, nausea, vomiting, abdominal or  leg pains      Objective:   Physical Exam Gen. Pleasant, obese, in no distress ENT - no lesions, no post nasal drip Neck: No JVD, no thyromegaly, no carotid bruits Lungs: no use of accessory muscles, no dullness to percussion, decreased without rales or rhonchi  Cardiovascular: Rhythm regular, heart sounds  normal, no murmurs or gallops, no peripheral edema Musculoskeletal: No deformities, no cyanosis or clubbing , no tremors        Assessment & Plan:

## 2016-05-24 NOTE — Patient Instructions (Signed)
CPAP is set at 13 cm and is very effective Try to use at least 6 hours every night If increased snoring, call us and we can change to auto settings

## 2016-06-19 DIAGNOSIS — G4733 Obstructive sleep apnea (adult) (pediatric): Secondary | ICD-10-CM | POA: Diagnosis not present

## 2016-07-19 DIAGNOSIS — G4733 Obstructive sleep apnea (adult) (pediatric): Secondary | ICD-10-CM | POA: Diagnosis not present

## 2016-07-23 DIAGNOSIS — G4733 Obstructive sleep apnea (adult) (pediatric): Secondary | ICD-10-CM | POA: Diagnosis not present

## 2016-07-31 ENCOUNTER — Other Ambulatory Visit: Payer: Self-pay | Admitting: Internal Medicine

## 2016-08-02 ENCOUNTER — Other Ambulatory Visit: Payer: Self-pay | Admitting: Family Medicine

## 2016-08-02 ENCOUNTER — Telehealth: Payer: Self-pay | Admitting: Family Medicine

## 2016-08-02 DIAGNOSIS — Z0000 Encounter for general adult medical examination without abnormal findings: Secondary | ICD-10-CM

## 2016-08-02 NOTE — Telephone Encounter (Signed)
Pt due for cpx and lab work in Nov 17.  I have placed the lab orders.  Please help the pt to make both appointments.  Thanks!!!

## 2016-08-02 NOTE — Telephone Encounter (Signed)
Sent to the pharmacy.  Pt due in Nov for cpx and lab work.  Message sent to scheduling.

## 2016-08-06 NOTE — Telephone Encounter (Signed)
lmom for pt to call  °

## 2016-08-07 NOTE — Telephone Encounter (Signed)
Pt need new Rx for Adderall run out on 8/13  Pt CPE and lab scheduled

## 2016-08-08 DIAGNOSIS — M7662 Achilles tendinitis, left leg: Secondary | ICD-10-CM | POA: Diagnosis not present

## 2016-08-08 DIAGNOSIS — M722 Plantar fascial fibromatosis: Secondary | ICD-10-CM | POA: Diagnosis not present

## 2016-08-08 NOTE — Telephone Encounter (Signed)
Ok to refill  adderall  Until comes for novemeber visit  But get tox screen  At pick up ( over due)

## 2016-08-09 MED ORDER — AMPHETAMINE-DEXTROAMPHET ER 20 MG PO CP24: ORAL_CAPSULE | ORAL | 0 refills | Status: DC

## 2016-08-09 MED ORDER — AMPHETAMINE-DEXTROAMPHET ER 20 MG PO CP24: 40.0000 mg | ORAL_CAPSULE | Freq: Every day | ORAL | 0 refills | Status: DC

## 2016-08-09 NOTE — Telephone Encounter (Signed)
Pt notified to pick up at the front desk. 

## 2016-08-10 ENCOUNTER — Encounter: Payer: Self-pay | Admitting: Internal Medicine

## 2016-08-10 DIAGNOSIS — Z79899 Other long term (current) drug therapy: Secondary | ICD-10-CM | POA: Diagnosis not present

## 2016-08-19 ENCOUNTER — Other Ambulatory Visit: Payer: Self-pay | Admitting: Internal Medicine

## 2016-08-19 DIAGNOSIS — G4733 Obstructive sleep apnea (adult) (pediatric): Secondary | ICD-10-CM | POA: Diagnosis not present

## 2016-08-21 NOTE — Telephone Encounter (Signed)
Sent to the pharmacy by e-scribe.  Pt has upcoming cpx on 11/16/16

## 2016-09-12 DIAGNOSIS — G4733 Obstructive sleep apnea (adult) (pediatric): Secondary | ICD-10-CM | POA: Diagnosis not present

## 2016-09-13 DIAGNOSIS — D485 Neoplasm of uncertain behavior of skin: Secondary | ICD-10-CM | POA: Diagnosis not present

## 2016-09-13 DIAGNOSIS — D1800 Hemangioma unspecified site: Secondary | ICD-10-CM | POA: Diagnosis not present

## 2016-09-13 DIAGNOSIS — D2371 Other benign neoplasm of skin of right lower limb, including hip: Secondary | ICD-10-CM | POA: Diagnosis not present

## 2016-09-13 DIAGNOSIS — D225 Melanocytic nevi of trunk: Secondary | ICD-10-CM | POA: Diagnosis not present

## 2016-09-13 DIAGNOSIS — D2271 Melanocytic nevi of right lower limb, including hip: Secondary | ICD-10-CM | POA: Diagnosis not present

## 2016-09-26 ENCOUNTER — Other Ambulatory Visit: Payer: Self-pay | Admitting: Internal Medicine

## 2016-09-26 DIAGNOSIS — Z1231 Encounter for screening mammogram for malignant neoplasm of breast: Secondary | ICD-10-CM

## 2016-10-12 DIAGNOSIS — G4733 Obstructive sleep apnea (adult) (pediatric): Secondary | ICD-10-CM | POA: Diagnosis not present

## 2016-10-19 ENCOUNTER — Ambulatory Visit
Admission: RE | Admit: 2016-10-19 | Discharge: 2016-10-19 | Disposition: A | Discharge status: Home / Self Care | Payer: BLUE CROSS/BLUE SHIELD | Source: Ambulatory Visit | Attending: Internal Medicine | Admitting: Internal Medicine

## 2016-10-19 DIAGNOSIS — Z1231 Encounter for screening mammogram for malignant neoplasm of breast: Secondary | ICD-10-CM

## 2016-11-09 ENCOUNTER — Other Ambulatory Visit (INDEPENDENT_AMBULATORY_CARE_PROVIDER_SITE_OTHER): Payer: BLUE CROSS/BLUE SHIELD

## 2016-11-09 DIAGNOSIS — Z0000 Encounter for general adult medical examination without abnormal findings: Secondary | ICD-10-CM | POA: Diagnosis not present

## 2016-11-09 LAB — CBC WITH DIFFERENTIAL/PLATELET
Basophils Absolute: 0 10*3/uL (ref 0.0–0.1)
Basophils Relative: 0.5 % (ref 0.0–3.0)
Eosinophils Absolute: 0.1 10*3/uL (ref 0.0–0.7)
Eosinophils Relative: 3.1 % (ref 0.0–5.0)
HCT: 37.1 % (ref 36.0–46.0)
Hemoglobin: 12.6 g/dL (ref 12.0–15.0)
Lymphocytes Relative: 28.7 % (ref 12.0–46.0)
Lymphs Abs: 1.2 10*3/uL (ref 0.7–4.0)
MCHC: 34.1 g/dL (ref 30.0–36.0)
MCV: 88.1 fl (ref 78.0–100.0)
Monocytes Absolute: 0.4 10*3/uL (ref 0.1–1.0)
Monocytes Relative: 8.4 % (ref 3.0–12.0)
Neutro Abs: 2.5 10*3/uL (ref 1.4–7.7)
Neutrophils Relative %: 59.3 % (ref 43.0–77.0)
Platelets: 256 10*3/uL (ref 150.0–400.0)
RBC: 4.21 Mil/uL (ref 3.87–5.11)
RDW: 13.3 % (ref 11.5–15.5)
WBC: 4.3 10*3/uL (ref 4.0–10.5)

## 2016-11-09 LAB — HEPATIC FUNCTION PANEL
ALT: 22 U/L (ref 0–35)
AST: 18 U/L (ref 0–37)
Albumin: 4.4 g/dL (ref 3.5–5.2)
Alkaline Phosphatase: 60 U/L (ref 39–117)
Bilirubin, Direct: 0.1 mg/dL (ref 0.0–0.3)
Total Bilirubin: 0.6 mg/dL (ref 0.2–1.2)
Total Protein: 6.8 g/dL (ref 6.0–8.3)

## 2016-11-09 LAB — BASIC METABOLIC PANEL
BUN: 17 mg/dL (ref 6–23)
CO2: 26 mEq/L (ref 19–32)
Calcium: 9.3 mg/dL (ref 8.4–10.5)
Chloride: 106 mEq/L (ref 96–112)
Creatinine, Ser: 0.97 mg/dL (ref 0.40–1.20)
GFR: 63.95 mL/min (ref >60.00)
Glucose, Bld: 112 mg/dL — ABNORMAL HIGH (ref 70–99)
Potassium: 4.2 mEq/L (ref 3.5–5.1)
Sodium: 141 mEq/L (ref 135–145)

## 2016-11-09 LAB — LIPID PANEL
Cholesterol: 188 mg/dL (ref 0–200)
HDL: 50.3 mg/dL (ref >39.00)
LDL Cholesterol: 118 mg/dL — ABNORMAL HIGH (ref 0–99)
NonHDL: 137.48
Total CHOL/HDL Ratio: 4
Triglycerides: 97 mg/dL (ref 0.0–149.0)
VLDL: 19.4 mg/dL (ref 0.0–40.0)

## 2016-11-09 LAB — TSH: TSH: 4.36 u[IU]/mL (ref 0.35–4.50)

## 2016-11-12 DIAGNOSIS — G4733 Obstructive sleep apnea (adult) (pediatric): Secondary | ICD-10-CM | POA: Diagnosis not present

## 2016-11-13 ENCOUNTER — Other Ambulatory Visit: Payer: Self-pay | Admitting: Internal Medicine

## 2016-11-14 NOTE — Telephone Encounter (Signed)
Sent to the pharmacy by e-scribe for 90 days. Pt has upcoming cpx on 11/16/16.

## 2016-11-15 NOTE — Progress Notes (Signed)
Pre visit review using our clinic review tool, if applicable. No additional management support is needed unless otherwise documented below in the visit note.  Chief Complaint  Patient presents with  . Annual Exam    HPI: Patient  Victoria Carter  51 y.o. comes in today for Preventive Health Care visit  And fu Chronic disease management  LIPIDS meds no se  ADD  meds help her a lot work s at home on [employer]  OSA under rx  Mood: b rought recently griving her loss of pet dog after 13 years and illness  Mom and caretaking  Inc stress at work responsibiities   Health Maintenance  Topic Date Due  . MAMMOGRAM  10/19/2018  . PAP SMEAR  11/06/2018  . TETANUS/TDAP  06/11/2022  . COLONOSCOPY  11/14/2022  . INFLUENZA VACCINE  Completed  . Hepatitis C Screening  Completed  . HIV Screening  Completed   Health Maintenance Review LIFESTYLE:  Exercise:  Not recently as much   Tobacco/ETS:n Alcohol:1-2 beers few time per week Sugar beverages:n except in coffee Sleep:osa Drug use: no HH of 2 Work:at home   Hours     ROS:  GEN/ HEENT: No fever, significant weight changes sweats headaches vision problems hearing changes, CV/ PULM; No chest pain shortness of breath cough, syncope,edema  change in exercise tolerance. GI /GU: No adominal pain, vomiting, change in bowel habits. No blood in the stool. No significant GU symptoms. SKIN/HEME: ,no acute skin rashes suspicious lesions or bleeding. No lymphadenopathy, nodules, masses.  NEURO/ PSYCH:  No neurologic signs such as weakness numbness. No depression anxiety. X grief emotional at times  Stress see above IMM/ Allergy: No unusual infections.  Allergy .   REST of 12 system review negative except as per HPI   Past Medical History:  Diagnosis Date  . Allergic rhinitis   . Depression   . Hx of abnormal cervical Pap smear    used cryo when first married and pregnant  . Hyperglycemia   . Hyperlipidemia   . Sinusitis   . UTI (lower  urinary tract infection)     Past Surgical History:  Procedure Laterality Date  . BREAST BIOPSY  1987  . CERVICAL BIOPSY    . CESAREAN SECTION      Family History  Problem Relation Age of Onset  . Heart attack Brother     age 76  . Diabetes type II    . Hyperlipidemia    . Factor V Leiden deficiency Daughter     Also husband    Social History   Social History  . Marital status: Married    Spouse name: N/A  . Number of children: N/A  . Years of education: N/A   Social History Main Topics  . Smoking status: Former Smoker    Quit date: 04/12/2009  . Smokeless tobacco: None  . Alcohol use 0.6 oz/week    1 Glasses of wine per week  . Drug use: No  . Sexual activity: Not Asked   Other Topics Concern  . None   Social History Narrative   Occupation: 40 per week 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  [employer]  In stress position    Married   Regular exercise- yes  yoga   HH of 2-  girls home from college   Pet dogs passed away 03/10/2016   G2P2    Outpatient Medications Prior to Visit  Medication Sig Dispense Refill  . atorvastatin (LIPITOR) 80 MG tablet TAKE  0.5 TABLETS (40 MG TOTAL) BY MOUTH DAILY. 45 tablet 0  . calcium-vitamin D (OSCAL WITH D) 250-125 MG-UNIT per tablet Take 1 tablet by mouth daily.     Marland Kitchen FLUoxetine (PROZAC) 20 MG capsule TAKE ONE CAPSULE BY MOUTH EVERY DAY 90 capsule 0  . ranitidine (ZANTAC) 150 MG tablet TAKE 1 TABLET BY MOUTH TWICE A DAY 60 tablet 3  . amphetamine-dextroamphetamine (ADDERALL XR) 20 MG 24 hr capsule Take 2 capsules (40 mg total) by mouth daily. 60 capsule 0  . amphetamine-dextroamphetamine (ADDERALL XR) 20 MG 24 hr capsule Take 2 capsules (40 mg total) by mouth daily. 60 capsule 0  . amphetamine-dextroamphetamine (ADDERALL XR) 20 MG 24 hr capsule Take 2 capsules (40 mg total) by mouth daily. 60 capsule 0   No facility-administered medications prior to visit.      EXAM:  BP 124/82 (BP Location: Right Arm, Patient Position: Sitting, Cuff Size: Large)    Temp 98 F (36.7 C) (Oral)   Ht 5' 6.5" (1.689 m)   Wt 230 lb 3.2 oz (104.4 kg)   BMI 36.60 kg/m   Body mass index is 36.6 kg/m.  Physical Exam: Vital signs reviewed RDE:YCXK is a well-developed well-nourished alert cooperative    who appearsr stated age in no acute distress.  HEENT: normocephalic atraumatic , Eyes: PERRL EOM's full, conjunctiva clear, Nares: paten,t no deformity discharge or tenderness., Ears: no deformity EAC's clear TMs with normal landmarks. Mouth: clear OP, no lesions, edema.  Moist mucous membranes. Dentition in adequate repair. NECK: supple without masses, thyromegaly or bruits. CHEST/PULM:  Clear to auscultation and percussion breath sounds equal no wheeze , rales or rhonchi. No chest wall deformities or tenderness.Breast: normal by inspection . No dimpling, discharge, masses, tenderness or discharge . CV: PMI is nondisplaced, S1 S2 no gallops, murmurs, rubs. Peripheral pulses are full without delay.No JVD .  ABDOMEN: Bowel sounds normal nontender  No guard or rebound, no hepato splenomegal no CVA tenderness.  No hernia. Extremtities:  No clubbing cyanosis or edema, no acute joint swelling or redness no focal atrophy NEURO:  Oriented x3, cranial nerves 3-12 appear to be intact, no obvious focal weakness,gait within normal limits no abnormal reflexes or asymmetrical  exces motor activitiy  SKIN: No acute rashes normal turgor, color, no bruising or petechiae. PSYCH: Oriented, good eye contact, no obvious depression anxiety, cognition and judgment appear normal. LN: no cervical axillary inguinal adenopathy  Lab Results  Component Value Date   WBC 4.3 11/09/2016   HGB 12.6 11/09/2016   HCT 37.1 11/09/2016   PLT 256.0 11/09/2016   GLUCOSE 112 (H) 11/09/2016   CHOL 188 11/09/2016   TRIG 97.0 11/09/2016   HDL 50.30 11/09/2016   LDLDIRECT 88.6 11/01/2014   LDLCALC 118 (H) 11/09/2016   ALT 22 11/09/2016   AST 18 11/09/2016   NA 141 11/09/2016   K 4.2 11/09/2016     CL 106 11/09/2016   CREATININE 0.97 11/09/2016   BUN 17 11/09/2016   CO2 26 11/09/2016   TSH 4.36 11/09/2016   HGBA1C 5.6 11/16/2016    ASSESSMENT AND PLAN:  Discussed the following assessment and plan:  Visit for preventive health examination  HYPERGLYCEMIA, FASTING - ok to day 5.9 - Plan: POCT A1C  Medication management  Attention deficit hyperactivity disorder (ADHD), combined type - benefot more than risk cont 3 months call for refill  ROV in 6 month  Hyperlipidemia, unspecified hyperlipidemia type - sligh tdeterioration  lsi emphazsized   Fasting hyperglycemia  Overall medication ADHD much more benefit than risk. Discussed about medication lifestyle. Job stress discussed. Exercise etc. Patient aware. ROV in 6 months for med check. Patient Care Team: Burnis Medin, MD as PCP - General Jari Pigg, MD (Dermatology) Juanita Craver, MD as Consulting Physician (Gastroenterology) Patient Instructions  Attention to you health   As discussed  Vit d 800 - 1000 iu per day  Get thyroid  Labs checked yearly  Refill the  adhd medication   Continue  to get sleep and  otu side as much as possible .  Avoid simple sugars and processed carbs  . Mediterranean diet type foods fruits  and veges .   condolences on your loss.    ROV 6 months med check  Health Maintenance, Female Introduction Adopting a healthy lifestyle and getting preventive care can go a long way to promote health and wellness. Talk with your health care provider about what schedule of regular examinations is right for you. This is a good chance for you to check in with your provider about disease prevention and staying healthy. In between checkups, there are plenty of things you can do on your own. Experts have done a lot of research about which lifestyle changes and preventive measures are most likely to keep you healthy. Ask your health care provider for more information. Weight and diet Eat a healthy diet  Be  sure to include plenty of vegetables, fruits, low-fat dairy products, and lean protein.  Do not eat a lot of foods high in solid fats, added sugars, or salt.  Get regular exercise. This is one of the most important things you can do for your health.  Most adults should exercise for at least 150 minutes each week. The exercise should increase your heart rate and make you sweat (moderate-intensity exercise).  Most adults should also do strengthening exercises at least twice a week. This is in addition to the moderate-intensity exercise. Maintain a healthy weight  Body mass index (BMI) is a measurement that can be used to identify possible weight problems. It estimates body fat based on height and weight. Your health care provider can help determine your BMI and help you achieve or maintain a healthy weight.  For females 52 years of age and older:  A BMI below 18.5 is considered underweight.  A BMI of 18.5 to 24.9 is normal.  A BMI of 25 to 29.9 is considered overweight.  A BMI of 30 and above is considered obese. Watch levels of cholesterol and blood lipids  You should start having your blood tested for lipids and cholesterol at 52 years of age, then have this test every 5 years.  You may need to have your cholesterol levels checked more often if:  Your lipid or cholesterol levels are high.  You are older than 52 years of age.  You are at high risk for heart disease. Cancer screening Lung Cancer  Lung cancer screening is recommended for adults 98-15 years old who are at high risk for lung cancer because of a history of smoking.  A yearly low-dose CT scan of the lungs is recommended for people who:  Currently smoke.  Have quit within the past 15 years.  Have at least a 30-pack-year history of smoking. A pack year is smoking an average of one pack of cigarettes a day for 1 year.  Yearly screening should continue until it has been 15 years since you quit.  Yearly screening  should stop if you develop  a health problem that would prevent you from having lung cancer treatment. Breast Cancer  Practice breast self-awareness. This means understanding how your breasts normally appear and feel.  It also means doing regular breast self-exams. Let your health care provider know about any changes, no matter how small.  If you are in your 20s or 30s, you should have a clinical breast exam (CBE) by a health care provider every 1-3 years as part of a regular health exam.  If you are 30 or older, have a CBE every year. Also consider having a breast X-ray (mammogram) every year.  If you have a family history of breast cancer, talk to your health care provider about genetic screening.  If you are at high risk for breast cancer, talk to your health care provider about having an MRI and a mammogram every year.  Breast cancer gene (BRCA) assessment is recommended for women who have family members with BRCA-related cancers. BRCA-related cancers include:  Breast.  Ovarian.  Tubal.  Peritoneal cancers.  Results of the assessment will determine the need for genetic counseling and BRCA1 and BRCA2 testing. Cervical Cancer  Your health care provider may recommend that you be screened regularly for cancer of the pelvic organs (ovaries, uterus, and vagina). This screening involves a pelvic examination, including checking for microscopic changes to the surface of your cervix (Pap test). You may be encouraged to have this screening done every 3 years, beginning at age 33.  For women ages 33-65, health care providers may recommend pelvic exams and Pap testing every 3 years, or they may recommend the Pap and pelvic exam, combined with testing for human papilloma virus (HPV), every 5 years. Some types of HPV increase your risk of cervical cancer. Testing for HPV may also be done on women of any age with unclear Pap test results.  Other health care providers may not recommend any screening  for nonpregnant women who are considered low risk for pelvic cancer and who do not have symptoms. Ask your health care provider if a screening pelvic exam is right for you.  If you have had past treatment for cervical cancer or a condition that could lead to cancer, you need Pap tests and screening for cancer for at least 20 years after your treatment. If Pap tests have been discontinued, your risk factors (such as having a new sexual partner) need to be reassessed to determine if screening should resume. Some women have medical problems that increase the chance of getting cervical cancer. In these cases, your health care provider may recommend more frequent screening and Pap tests. Colorectal Cancer  This type of cancer can be detected and often prevented.  Routine colorectal cancer screening usually begins at 52 years of age and continues through 52 years of age.  Your health care provider may recommend screening at an earlier age if you have risk factors for colon cancer.  Your health care provider may also recommend using home test kits to check for hidden blood in the stool.  A small camera at the end of a tube can be used to examine your colon directly (sigmoidoscopy or colonoscopy). This is done to check for the earliest forms of colorectal cancer.  Routine screening usually begins at age 75.  Direct examination of the colon should be repeated every 5-10 years through 52 years of age. However, you may need to be screened more often if early forms of precancerous polyps or small growths are found. Skin Cancer  Check  your skin from head to toe regularly.  Tell your health care provider about any new moles or changes in moles, especially if there is a change in a mole's shape or color.  Also tell your health care provider if you have a mole that is larger than the size of a pencil eraser.  Always use sunscreen. Apply sunscreen liberally and repeatedly throughout the day.  Protect  yourself by wearing long sleeves, pants, a wide-brimmed hat, and sunglasses whenever you are outside. Heart disease, diabetes, and high blood pressure  High blood pressure causes heart disease and increases the risk of stroke. High blood pressure is more likely to develop in:  People who have blood pressure in the high end of the normal range (130-139/85-89 mm Hg).  People who are overweight or obese.  People who are African American.  If you are 65-47 years of age, have your blood pressure checked every 3-5 years. If you are 32 years of age or older, have your blood pressure checked every year. You should have your blood pressure measured twice-once when you are at a hospital or clinic, and once when you are not at a hospital or clinic. Record the average of the two measurements. To check your blood pressure when you are not at a hospital or clinic, you can use:  An automated blood pressure machine at a pharmacy.  A home blood pressure monitor.  If you are between 87 years and 95 years old, ask your health care provider if you should take aspirin to prevent strokes.  Have regular diabetes screenings. This involves taking a blood sample to check your fasting blood sugar level.  If you are at a normal weight and have a low risk for diabetes, have this test once every three years after 52 years of age.  If you are overweight and have a high risk for diabetes, consider being tested at a younger age or more often. Preventing infection Hepatitis B  If you have a higher risk for hepatitis B, you should be screened for this virus. You are considered at high risk for hepatitis B if:  You were born in a country where hepatitis B is common. Ask your health care provider which countries are considered high risk.  Your parents were born in a high-risk country, and you have not been immunized against hepatitis B (hepatitis B vaccine).  You have HIV or AIDS.  You use needles to inject street  drugs.  You live with someone who has hepatitis B.  You have had sex with someone who has hepatitis B.  You get hemodialysis treatment.  You take certain medicines for conditions, including cancer, organ transplantation, and autoimmune conditions. Hepatitis C  Blood testing is recommended for:  Everyone born from [birth year] through 1966.  Anyone with known risk factors for hepatitis C. Sexually transmitted infections (STIs)  You should be screened for sexually transmitted infections (STIs) including gonorrhea and chlamydia if:  You are sexually active and are younger than 52 years of age.  You are older than 52 years of age and your health care provider tells you that you are at risk for this type of infection.  Your sexual activity has changed since you were last screened and you are at an increased risk for chlamydia or gonorrhea. Ask your health care provider if you are at risk.  If you do not have HIV, but are at risk, it may be recommended that you take a prescription medicine daily to prevent  HIV infection. This is called pre-exposure prophylaxis (PrEP). You are considered at risk if:  You are sexually active and do not regularly use condoms or know the HIV status of your partner(s).  You take drugs by injection.  You are sexually active with a partner who has HIV. Talk with your health care provider about whether you are at high risk of being infected with HIV. If you choose to begin PrEP, you should first be tested for HIV. You should then be tested every 3 months for as long as you are taking PrEP. Pregnancy  If you are premenopausal and you may become pregnant, ask your health care provider about preconception counseling.  If you may become pregnant, take 400 to 800 micrograms (mcg) of folic acid every day.  If you want to prevent pregnancy, talk to your health care provider about birth control (contraception). Osteoporosis and menopause  Osteoporosis is a disease in  which the bones lose minerals and strength with aging. This can result in serious bone fractures. Your risk for osteoporosis can be identified using a bone density scan.  If you are 17 years of age or older, or if you are at risk for osteoporosis and fractures, ask your health care provider if you should be screened.  Ask your health care provider whether you should take a calcium or vitamin D supplement to lower your risk for osteoporosis.  Menopause may have certain physical symptoms and risks.  Hormone replacement therapy may reduce some of these symptoms and risks. Talk to your health care provider about whether hormone replacement therapy is right for you. Follow these instructions at home:  Schedule regular health, dental, and eye exams.  Stay current with your immunizations.  Do not use any tobacco products including cigarettes, chewing tobacco, or electronic cigarettes.  If you are pregnant, do not drink alcohol.  If you are breastfeeding, limit how much and how often you drink alcohol.  Limit alcohol intake to no more than 1 drink per day for nonpregnant women. One drink equals 12 ounces of beer, 5 ounces of wine, or 1 ounces of hard liquor.  Do not use street drugs.  Do not share needles.  Ask your health care provider for help if you need support or information about quitting drugs.  Tell your health care provider if you often feel depressed.  Tell your health care provider if you have ever been abused or do not feel safe at home. This information is not intended to replace advice [place] to you by your health care provider. Make sure you discuss any questions you have with your health care provider. Document Released: 07/02/2011 Document Revised: 05/24/2016 Document Reviewed: 09/20/2015  2017 Elsevier   Bone Health Introduction Bones protect organs, store calcium, and anchor muscles. Good health habits, such as eating nutritious foods and exercising regularly, are  important for maintaining healthy bones. They can also help to prevent a condition that causes bones to lose density and become weak and brittle (osteoporosis). Why is bone mass important? Bone mass refers to the amount of bone tissue that you have. The higher your bone mass, the stronger your bones. An important step toward having healthy bones throughout life is to have strong and dense bones during childhood. A young adult who has a high bone mass is more likely to have a high bone mass later in life. Bone mass at its greatest it is called peak bone mass. A large decline in bone mass occurs in older adults.  In women, it occurs about the time of menopause. During this time, it is important to practice good health habits, because if more bone is lost than what is replaced, the bones will become less healthy and more likely to break (fracture). If you find that you have a low bone mass, you may be able to prevent osteoporosis or further bone loss by changing your diet and lifestyle. How can I find out if my bone mass is low? Bone mass can be measured with an X-ray test that is called a bone mineral density (BMD) test. This test is recommended for all women who are age 65 or older. It may also be recommended for men who are age 61 or older, or for people who are more likely to develop osteoporosis due to:  Having bones that break easily.  Having a long-term disease that weakens bones, such as kidney disease or rheumatoid arthritis.  Having menopause earlier than normal.  Taking medicine that weakens bones, such as steroids, thyroid hormones, or hormone treatment for breast cancer or prostate cancer.  Smoking.  Drinking three or more alcoholic drinks each day. What are the nutritional recommendations for healthy bones? To have healthy bones, you need to get enough of the right minerals and vitamins. Most nutrition experts recommend getting these nutrients from the foods that you eat. Nutritional  recommendations vary from person to person. Ask your health care provider what is healthy for you. Here are some general guidelines. Calcium Recommendations  Calcium is the most important (essential) mineral for bone health. Most people can get enough calcium from their diet, but supplements may be recommended for people who are at risk for osteoporosis. Good sources of calcium include:  Dairy products, such as low-fat or nonfat milk, cheese, and yogurt.  Dark green leafy vegetables, such as bok choy and broccoli.  Calcium-fortified foods, such as orange juice, cereal, bread, soy beverages, and tofu products.  Nuts, such as almonds. Follow these recommended amounts for daily calcium intake:  Children, age 13?3: 700 mg.  Children, age 57?8: 1,000 mg.  Children, age 25?13: 1,300 mg.  Teens, age 139?18: 1,300 mg.  Adults, age 57?50: 1,000 mg.  Adults, age 65?70:  Men: 1,000 mg.  Women: 1,200 mg.  Adults, age 137 or older: 1,200 mg.  Pregnant and breastfeeding females:  Teens: 1,300 mg.  Adults: 1,000 mg. Vitamin D Recommendations  Vitamin D is the most essential vitamin for bone health. It helps the body to absorb calcium. Sunlight stimulates the skin to make vitamin D, so be sure to get enough sunlight. If you live in a cold climate or you do not get outside often, your health care provider may recommend that you take vitamin D supplements. Good sources of vitamin D in your diet include:  Egg yolks.  Saltwater fish.  Milk and cereal fortified with vitamin D. Follow these recommended amounts for daily vitamin D intake:  Children and teens, age 130?18: 25 international units.  Adults, age 571 or younger: 400-800 international units.  Adults, age 24 or older: 800-1,000 international units. Other Nutrients  Other nutrients for bone health include:  Phosphorus. This mineral is found in meat, poultry, dairy foods, nuts, and legumes. The recommended daily intake for adult men  and adult women is 700 mg.  Magnesium. This mineral is found in seeds, nuts, dark green vegetables, and legumes. The recommended daily intake for adult men is 400?420 mg. For adult women, it is 310?320 mg.  Vitamin K. This vitamin  is found in green leafy vegetables. The recommended daily intake is 120 mg for adult men and 90 mg for adult women. What type of physical activity is best for building and maintaining healthy bones? Weight-bearing and strength-building activities are important for building and maintaining peak bone mass. Weight-bearing activities cause muscles and bones to work against gravity. Strength-building activities increases muscle strength that supports bones. Weight-bearing and muscle-building activities include:  Walking and hiking.  Jogging and running.  Dancing.  Gym exercises.  Lifting weights.  Tennis and racquetball.  Climbing stairs.  Aerobics. Adults should get at least 30 minutes of moderate physical activity on most days. Children should get at least 60 minutes of moderate physical activity on most days. Ask your health care provide what type of exercise is best for you. Where can I find more information? For more information, check out the following websites:  [place]: [employer]  [employer]: http://www.niams.AnonymousEar.fr.asp This information is not intended to replace advice [place] to you by your health care provider. Make sure you discuss any questions you have with your health care provider. Document Released: 03/08/2004 Document Revised: 07/06/2016 Document Reviewed: 12/22/2014  2017 Elsevier     Mariann Laster K. Panosh M.D.

## 2016-11-16 ENCOUNTER — Ambulatory Visit (INDEPENDENT_AMBULATORY_CARE_PROVIDER_SITE_OTHER): Payer: BLUE CROSS/BLUE SHIELD | Admitting: Internal Medicine

## 2016-11-16 ENCOUNTER — Encounter: Payer: Self-pay | Admitting: Internal Medicine

## 2016-11-16 VITALS — BP 124/82 | Temp 98.0°F | Ht 66.5 in | Wt 230.2 lb

## 2016-11-16 DIAGNOSIS — R7309 Other abnormal glucose: Secondary | ICD-10-CM | POA: Diagnosis not present

## 2016-11-16 DIAGNOSIS — Z0000 Encounter for general adult medical examination without abnormal findings: Secondary | ICD-10-CM | POA: Diagnosis not present

## 2016-11-16 DIAGNOSIS — Z79899 Other long term (current) drug therapy: Secondary | ICD-10-CM

## 2016-11-16 DIAGNOSIS — R7301 Impaired fasting glucose: Secondary | ICD-10-CM

## 2016-11-16 DIAGNOSIS — E785 Hyperlipidemia, unspecified: Secondary | ICD-10-CM | POA: Diagnosis not present

## 2016-11-16 DIAGNOSIS — F902 Attention-deficit hyperactivity disorder, combined type: Secondary | ICD-10-CM

## 2016-11-16 LAB — POCT GLYCOSYLATED HEMOGLOBIN (HGB A1C): Hemoglobin A1C: 5.6

## 2016-11-16 MED ORDER — AMPHETAMINE-DEXTROAMPHET ER 20 MG PO CP24: ORAL_CAPSULE | ORAL | 0 refills | Status: DC

## 2016-11-16 MED ORDER — AMPHETAMINE-DEXTROAMPHET ER 20 MG PO CP24: 40.0000 mg | ORAL_CAPSULE | Freq: Every day | ORAL | 0 refills | Status: DC

## 2016-11-16 NOTE — Patient Instructions (Addendum)
Attention to you health   As discussed  Vit d 800 - 1000 iu per day  Get thyroid  Labs checked yearly  Refill the  adhd medication   Continue  to get sleep and  otu side as much as possible .  Avoid simple sugars and processed carbs  . Mediterranean diet type foods fruits  and veges .   condolences on your loss.    ROV 6 months med check  Health Maintenance, Female Introduction Adopting a healthy lifestyle and getting preventive care can go a long way to promote health and wellness. Talk with your health care provider about what schedule of regular examinations is right for you. This is a good chance for you to check in with your provider about disease prevention and staying healthy. In between checkups, there are plenty of things you can do on your own. Experts have done a lot of research about which lifestyle changes and preventive measures are most likely to keep you healthy. Ask your health care provider for more information. Weight and diet Eat a healthy diet  Be sure to include plenty of vegetables, fruits, low-fat dairy products, and lean protein.  Do not eat a lot of foods high in solid fats, added sugars, or salt.  Get regular exercise. This is one of the most important things you can do for your health.  Most adults should exercise for at least 150 minutes each week. The exercise should increase your heart rate and make you sweat (moderate-intensity exercise).  Most adults should also do strengthening exercises at least twice a week. This is in addition to the moderate-intensity exercise. Maintain a healthy weight  Body mass index (BMI) is a measurement that can be used to identify possible weight problems. It estimates body fat based on height and weight. Your health care provider can help determine your BMI and help you achieve or maintain a healthy weight.  For females 44 years of age and older:  A BMI below 18.5 is considered underweight.  A BMI of 18.5 to 24.9 is  normal.  A BMI of 25 to 29.9 is considered overweight.  A BMI of 30 and above is considered obese. Watch levels of cholesterol and blood lipids  You should start having your blood tested for lipids and cholesterol at 52 years of age, then have this test every 5 years.  You may need to have your cholesterol levels checked more often if:  Your lipid or cholesterol levels are high.  You are older than 52 years of age.  You are at high risk for heart disease. Cancer screening Lung Cancer  Lung cancer screening is recommended for adults 72-8 years old who are at high risk for lung cancer because of a history of smoking.  A yearly low-dose CT scan of the lungs is recommended for people who:  Currently smoke.  Have quit within the past 15 years.  Have at least a 30-pack-year history of smoking. A pack year is smoking an average of one pack of cigarettes a day for 1 year.  Yearly screening should continue until it has been 15 years since you quit.  Yearly screening should stop if you develop a health problem that would prevent you from having lung cancer treatment. Breast Cancer  Practice breast self-awareness. This means understanding how your breasts normally appear and feel.  It also means doing regular breast self-exams. Let your health care provider know about any changes, no matter how small.  If you  are in your 20s or 30s, you should have a clinical breast exam (CBE) by a health care provider every 1-3 years as part of a regular health exam.  If you are 74 or older, have a CBE every year. Also consider having a breast X-ray (mammogram) every year.  If you have a family history of breast cancer, talk to your health care provider about genetic screening.  If you are at high risk for breast cancer, talk to your health care provider about having an MRI and a mammogram every year.  Breast cancer gene (BRCA) assessment is recommended for women who have family members with  BRCA-related cancers. BRCA-related cancers include:  Breast.  Ovarian.  Tubal.  Peritoneal cancers.  Results of the assessment will determine the need for genetic counseling and BRCA1 and BRCA2 testing. Cervical Cancer  Your health care provider may recommend that you be screened regularly for cancer of the pelvic organs (ovaries, uterus, and vagina). This screening involves a pelvic examination, including checking for microscopic changes to the surface of your cervix (Pap test). You may be encouraged to have this screening done every 3 years, beginning at age 29.  For women ages 77-65, health care providers may recommend pelvic exams and Pap testing every 3 years, or they may recommend the Pap and pelvic exam, combined with testing for human papilloma virus (HPV), every 5 years. Some types of HPV increase your risk of cervical cancer. Testing for HPV may also be done on women of any age with unclear Pap test results.  Other health care providers may not recommend any screening for nonpregnant women who are considered low risk for pelvic cancer and who do not have symptoms. Ask your health care provider if a screening pelvic exam is right for you.  If you have had past treatment for cervical cancer or a condition that could lead to cancer, you need Pap tests and screening for cancer for at least 20 years after your treatment. If Pap tests have been discontinued, your risk factors (such as having a new sexual partner) need to be reassessed to determine if screening should resume. Some women have medical problems that increase the chance of getting cervical cancer. In these cases, your health care provider may recommend more frequent screening and Pap tests. Colorectal Cancer  This type of cancer can be detected and often prevented.  Routine colorectal cancer screening usually begins at 52 years of age and continues through 52 years of age.  Your health care provider may recommend screening at  an earlier age if you have risk factors for colon cancer.  Your health care provider may also recommend using home test kits to check for hidden blood in the stool.  A small camera at the end of a tube can be used to examine your colon directly (sigmoidoscopy or colonoscopy). This is done to check for the earliest forms of colorectal cancer.  Routine screening usually begins at age 62.  Direct examination of the colon should be repeated every 5-10 years through 52 years of age. However, you may need to be screened more often if early forms of precancerous polyps or small growths are found. Skin Cancer  Check your skin from head to toe regularly.  Tell your health care provider about any new moles or changes in moles, especially if there is a change in a mole's shape or color.  Also tell your health care provider if you have a mole that is larger than the size  of a pencil eraser.  Always use sunscreen. Apply sunscreen liberally and repeatedly throughout the day.  Protect yourself by wearing long sleeves, pants, a wide-brimmed hat, and sunglasses whenever you are outside. Heart disease, diabetes, and high blood pressure  High blood pressure causes heart disease and increases the risk of stroke. High blood pressure is more likely to develop in:  People who have blood pressure in the high end of the normal range (130-139/85-89 mm Hg).  People who are overweight or obese.  People who are African American.  If you are 69-31 years of age, have your blood pressure checked every 3-5 years. If you are 70 years of age or older, have your blood pressure checked every year. You should have your blood pressure measured twice-once when you are at a hospital or clinic, and once when you are not at a hospital or clinic. Record the average of the two measurements. To check your blood pressure when you are not at a hospital or clinic, you can use:  An automated blood pressure machine at a pharmacy.  A  home blood pressure monitor.  If you are between 70 years and 55 years old, ask your health care provider if you should take aspirin to prevent strokes.  Have regular diabetes screenings. This involves taking a blood sample to check your fasting blood sugar level.  If you are at a normal weight and have a low risk for diabetes, have this test once every three years after 52 years of age.  If you are overweight and have a high risk for diabetes, consider being tested at a younger age or more often. Preventing infection Hepatitis B  If you have a higher risk for hepatitis B, you should be screened for this virus. You are considered at high risk for hepatitis B if:  You were born in a country where hepatitis B is common. Ask your health care provider which countries are considered high risk.  Your parents were born in a high-risk country, and you have not been immunized against hepatitis B (hepatitis B vaccine).  You have HIV or AIDS.  You use needles to inject street drugs.  You live with someone who has hepatitis B.  You have had sex with someone who has hepatitis B.  You get hemodialysis treatment.  You take certain medicines for conditions, including cancer, organ transplantation, and autoimmune conditions. Hepatitis C  Blood testing is recommended for:  Everyone born from [birth year] through 1966.  Anyone with known risk factors for hepatitis C. Sexually transmitted infections (STIs)  You should be screened for sexually transmitted infections (STIs) including gonorrhea and chlamydia if:  You are sexually active and are younger than 52 years of age.  You are older than 52 years of age and your health care provider tells you that you are at risk for this type of infection.  Your sexual activity has changed since you were last screened and you are at an increased risk for chlamydia or gonorrhea. Ask your health care provider if you are at risk.  If you do not have HIV, but are  at risk, it may be recommended that you take a prescription medicine daily to prevent HIV infection. This is called pre-exposure prophylaxis (PrEP). You are considered at risk if:  You are sexually active and do not regularly use condoms or know the HIV status of your partner(s).  You take drugs by injection.  You are sexually active with a partner who has HIV. Talk  with your health care provider about whether you are at high risk of being infected with HIV. If you choose to begin PrEP, you should first be tested for HIV. You should then be tested every 3 months for as long as you are taking PrEP. Pregnancy  If you are premenopausal and you may become pregnant, ask your health care provider about preconception counseling.  If you may become pregnant, take 400 to 800 micrograms (mcg) of folic acid every day.  If you want to prevent pregnancy, talk to your health care provider about birth control (contraception). Osteoporosis and menopause  Osteoporosis is a disease in which the bones lose minerals and strength with aging. This can result in serious bone fractures. Your risk for osteoporosis can be identified using a bone density scan.  If you are 45 years of age or older, or if you are at risk for osteoporosis and fractures, ask your health care provider if you should be screened.  Ask your health care provider whether you should take a calcium or vitamin D supplement to lower your risk for osteoporosis.  Menopause may have certain physical symptoms and risks.  Hormone replacement therapy may reduce some of these symptoms and risks. Talk to your health care provider about whether hormone replacement therapy is right for you. Follow these instructions at home:  Schedule regular health, dental, and eye exams.  Stay current with your immunizations.  Do not use any tobacco products including cigarettes, chewing tobacco, or electronic cigarettes.  If you are pregnant, do not drink  alcohol.  If you are breastfeeding, limit how much and how often you drink alcohol.  Limit alcohol intake to no more than 1 drink per day for nonpregnant women. One drink equals 12 ounces of beer, 5 ounces of wine, or 1 ounces of hard liquor.  Do not use street drugs.  Do not share needles.  Ask your health care provider for help if you need support or information about quitting drugs.  Tell your health care provider if you often feel depressed.  Tell your health care provider if you have ever been abused or do not feel safe at home. This information is not intended to replace advice [place] to you by your health care provider. Make sure you discuss any questions you have with your health care provider. Document Released: 07/02/2011 Document Revised: 05/24/2016 Document Reviewed: 09/20/2015  2017 Elsevier   Bone Health Introduction Bones protect organs, store calcium, and anchor muscles. Good health habits, such as eating nutritious foods and exercising regularly, are important for maintaining healthy bones. They can also help to prevent a condition that causes bones to lose density and become weak and brittle (osteoporosis). Why is bone mass important? Bone mass refers to the amount of bone tissue that you have. The higher your bone mass, the stronger your bones. An important step toward having healthy bones throughout life is to have strong and dense bones during childhood. A young adult who has a high bone mass is more likely to have a high bone mass later in life. Bone mass at its greatest it is called peak bone mass. A large decline in bone mass occurs in older adults. In women, it occurs about the time of menopause. During this time, it is important to practice good health habits, because if more bone is lost than what is replaced, the bones will become less healthy and more likely to break (fracture). If you find that you have a low bone mass,  you may be able to prevent osteoporosis  or further bone loss by changing your diet and lifestyle. How can I find out if my bone mass is low? Bone mass can be measured with an X-ray test that is called a bone mineral density (BMD) test. This test is recommended for all women who are age 52 or older. It may also be recommended for men who are age 28 or older, or for people who are more likely to develop osteoporosis due to:  Having bones that break easily.  Having a long-term disease that weakens bones, such as kidney disease or rheumatoid arthritis.  Having menopause earlier than normal.  Taking medicine that weakens bones, such as steroids, thyroid hormones, or hormone treatment for breast cancer or prostate cancer.  Smoking.  Drinking three or more alcoholic drinks each day. What are the nutritional recommendations for healthy bones? To have healthy bones, you need to get enough of the right minerals and vitamins. Most nutrition experts recommend getting these nutrients from the foods that you eat. Nutritional recommendations vary from person to person. Ask your health care provider what is healthy for you. Here are some general guidelines. Calcium Recommendations  Calcium is the most important (essential) mineral for bone health. Most people can get enough calcium from their diet, but supplements may be recommended for people who are at risk for osteoporosis. Good sources of calcium include:  Dairy products, such as low-fat or nonfat milk, cheese, and yogurt.  Dark green leafy vegetables, such as bok choy and broccoli.  Calcium-fortified foods, such as orange juice, cereal, bread, soy beverages, and tofu products.  Nuts, such as almonds. Follow these recommended amounts for daily calcium intake:  Children, age 73?3: 700 mg.  Children, age 69?8: 1,000 mg.  Children, age 33?13: 1,300 mg.  Teens, age 27?18: 1,300 mg.  Adults, age 63?50: 1,000 mg.  Adults, age 9?70:  Men: 1,000 mg.  Women: 1,200 mg.  Adults, age 38  or older: 1,200 mg.  Pregnant and breastfeeding females:  Teens: 1,300 mg.  Adults: 1,000 mg. Vitamin D Recommendations  Vitamin D is the most essential vitamin for bone health. It helps the body to absorb calcium. Sunlight stimulates the skin to make vitamin D, so be sure to get enough sunlight. If you live in a cold climate or you do not get outside often, your health care provider may recommend that you take vitamin D supplements. Good sources of vitamin D in your diet include:  Egg yolks.  Saltwater fish.  Milk and cereal fortified with vitamin D. Follow these recommended amounts for daily vitamin D intake:  Children and teens, age 78?18: 28 international units.  Adults, age 2 or younger: 400-800 international units.  Adults, age 97 or older: 800-1,000 international units. Other Nutrients  Other nutrients for bone health include:  Phosphorus. This mineral is found in meat, poultry, dairy foods, nuts, and legumes. The recommended daily intake for adult men and adult women is 700 mg.  Magnesium. This mineral is found in seeds, nuts, dark green vegetables, and legumes. The recommended daily intake for adult men is 400?420 mg. For adult women, it is 310?320 mg.  Vitamin K. This vitamin is found in green leafy vegetables. The recommended daily intake is 120 mg for adult men and 90 mg for adult women. What type of physical activity is best for building and maintaining healthy bones? Weight-bearing and strength-building activities are important for building and maintaining peak bone mass. Weight-bearing activities cause muscles  and bones to work against gravity. Strength-building activities increases muscle strength that supports bones. Weight-bearing and muscle-building activities include:  Walking and hiking.  Jogging and running.  Dancing.  Gym exercises.  Lifting weights.  Tennis and racquetball.  Climbing stairs.  Aerobics. Adults should get at least 30 minutes of  moderate physical activity on most days. Children should get at least 60 minutes of moderate physical activity on most days. Ask your health care provide what type of exercise is best for you. Where can I find more information? For more information, check out the following websites:  [place]: [employer]se  [employer]: http://www.niams.AnonymousEar.fr.asp This information is not intended to replace advice [place] to you by your health care provider. Make sure you discuss any questions you have with your health care provider. Document Released: 03/08/2004 Document Revised: 07/06/2016 Document Reviewed: 12/22/2014  2017 Elsevier

## 2016-11-30 ENCOUNTER — Other Ambulatory Visit: Payer: Self-pay | Admitting: Internal Medicine

## 2016-12-03 NOTE — Telephone Encounter (Signed)
Sent to the pharmacy by e-scribe for 6 months.  Pt has cpx on 11/06/16 and advised to return in 6 months.

## 2016-12-12 DIAGNOSIS — G4733 Obstructive sleep apnea (adult) (pediatric): Secondary | ICD-10-CM | POA: Diagnosis not present

## 2017-01-15 ENCOUNTER — Other Ambulatory Visit: Payer: Self-pay | Admitting: Internal Medicine

## 2017-01-17 ENCOUNTER — Emergency Department (HOSPITAL_COMMUNITY)
Admission: EM | Admit: 2017-01-17 | Discharge: 2017-01-17 | Disposition: A | Discharge status: Home / Self Care | Payer: BLUE CROSS/BLUE SHIELD | Attending: Emergency Medicine | Admitting: Emergency Medicine

## 2017-01-17 ENCOUNTER — Emergency Department (HOSPITAL_BASED_OUTPATIENT_CLINIC_OR_DEPARTMENT_OTHER)
Admit: 2017-01-17 | Discharge: 2017-01-17 | Disposition: A | Discharge status: Home / Self Care | Payer: BLUE CROSS/BLUE SHIELD | Attending: Emergency Medicine | Admitting: Emergency Medicine

## 2017-01-17 ENCOUNTER — Encounter (HOSPITAL_COMMUNITY): Payer: Self-pay | Admitting: Emergency Medicine

## 2017-01-17 DIAGNOSIS — F909 Attention-deficit hyperactivity disorder, unspecified type: Secondary | ICD-10-CM | POA: Insufficient documentation

## 2017-01-17 DIAGNOSIS — M79609 Pain in unspecified limb: Secondary | ICD-10-CM | POA: Diagnosis not present

## 2017-01-17 DIAGNOSIS — Z79899 Other long term (current) drug therapy: Secondary | ICD-10-CM | POA: Diagnosis not present

## 2017-01-17 DIAGNOSIS — M79661 Pain in right lower leg: Secondary | ICD-10-CM | POA: Insufficient documentation

## 2017-01-17 DIAGNOSIS — Z87891 Personal history of nicotine dependence: Secondary | ICD-10-CM | POA: Insufficient documentation

## 2017-01-17 NOTE — Progress Notes (Signed)
VASCULAR LAB PRELIMINARY  PRELIMINARY  PRELIMINARY  PRELIMINARY  Right lower extremity venous duplex completed.    Preliminary report:  Right:  No evidence of DVT, superficial thrombosis, or Baker's cyst.  SLAUGHTER, [place], RVS 01/17/2017, 2:52 PM

## 2017-01-17 NOTE — ED Notes (Signed)
Bed: WA22 Expected date:  Expected time:  Means of arrival:  Comments: 

## 2017-01-17 NOTE — Discharge Instructions (Signed)
Continue ibuprofen and ice/heat Follow up with PCP

## 2017-01-17 NOTE — ED Provider Notes (Signed)
[facility]-EMERGENCY DEPT Provider Note   CSN: [id number] Arrival date & time: 01/17/17  1124     History   Chief Complaint Chief Complaint  Patient presents with  . Leg Swelling    HPI Victoria Carter is a 52 y.o. female who presents with right calf pain. The pain started yesterday. It woke her up from sleep. She states she took an Ibuprofen and iced it and it felt better. Feels better when she walks. She was going to go to her PCP however they are closed due to snow. She endorses some swelling. She states she is relatively sedentary because she works from home and doesn't exercise. Sits for long periods of time. No recent surgery/travel, hx of cancer, leg swelling, hemoptysis, prior DVT/PE, or hormone use. No chest pain or SOB. No injury. She also reports transient tingling in her toes while she was in the waiting room which has resolved.  HPI  Past Medical History:  Diagnosis Date  . Allergic rhinitis   . Depression   . Hx of abnormal cervical Pap smear    used cryo when first married and pregnant  . Hyperglycemia   . Hyperlipidemia   . Sinusitis   . UTI (lower urinary tract infection)     Patient Active Problem List   Diagnosis Date Noted  . OSA (obstructive sleep apnea) 02/02/2016  . Hyperlipidemia 05/09/2015  . Fasting hyperglycemia 05/09/2015  . Sleep disorder breathing 11/08/2014  . Visit for preventive health examination 11/08/2014  . Medication management 08/29/2014  . Attention deficit hyperactivity disorder (ADHD), combined type 07/28/2014  . Other and unspecified hyperlipidemia 05/06/2014  . Vaginitis and vulvovaginitis 11/04/2013  . Severe obesity (BMI >= 40) (HCC) 09/16/2013  . Hx of adenomatous colonic polyps 12/10/2012  . Routine gynecological examination 06/11/2012  . Pre-diabetes 06/11/2012  . Perimenopausal 06/11/2012  . Menstrual cramps 11/07/2011  . Obesity 06/06/2011  . Family history of heart disease in female family member before age 19 06/06/2011    . Routine general medical examination at a health care facility 06/05/2011  . Preventative health care 06/05/2011  . Hx of abnormal cervical Pap smear   . URINALYSIS, ABNORMAL 02/07/2010  . HYPERLIPIDEMIA 12/30/2007  . DYSMENORRHEA 12/30/2007  . HYPERGLYCEMIA, FASTING 12/30/2007  . DEPRESSION 10/01/2007  . Allergic rhinitis 10/01/2007    Past Surgical History:  Procedure Laterality Date  . BREAST BIOPSY  1987  . CERVICAL BIOPSY    . CESAREAN SECTION      OB History    Gravida Para Term Preterm AB Living   2 2           SAB TAB Ectopic Multiple Live Births                   Home Medications    Prior to Admission medications   Medication Sig Start Date End Date Taking? Authorizing Provider  amphetamine-dextroamphetamine (ADDERALL XR) 20 MG 24 hr capsule Take 2 capsules (40 mg total) by mouth daily. 11/16/16  Yes Madelin Headings, MD  atorvastatin (LIPITOR) 80 MG tablet TAKE 0.5 TABLETS (40 MG TOTAL) BY MOUTH DAILY. 11/14/16  Yes Madelin Headings, MD  FLUoxetine (PROZAC) 20 MG capsule TAKE ONE CAPSULE BY MOUTH EVERY DAY 11/14/16  Yes Madelin Headings, MD  ranitidine (ZANTAC) 150 MG tablet TAKE 1 TABLET BY MOUTH TWICE A DAY Patient taking differently: TAKE 1 TABLET BY MOUTH TWICE A DAY PRN FOR HEARTBURN 12/03/16  Yes Madelin Headings, MD  amphetamine-dextroamphetamine (ADDERALL XR)  20 MG 24 hr capsule Take 2 capsules (40 mg total) by mouth daily. Patient not taking: Reported on 01/17/2017 11/16/16   Madelin HeadingsWanda K Panosh, MD  amphetamine-dextroamphetamine (ADDERALL XR) 20 MG 24 hr capsule Take 2 capsules (40 mg total) by mouth daily. Patient not taking: Reported on 01/17/2017 11/16/16   Madelin HeadingsWanda K Panosh, MD    Family History Family History  Problem Relation Age of Onset  . Heart attack Brother     age 53  . Diabetes type II    . Hyperlipidemia    . Factor V Leiden deficiency Daughter     Also husband    Social History Social History  Substance Use Topics  . Smoking status: Former  Smoker    Quit date: 04/12/2009  . Smokeless tobacco: Not on file  . Alcohol use 0.6 oz/week    1 Glasses of wine per week     Allergies   Patient has no known allergies.   Review of Systems Review of Systems  Respiratory: Negative for shortness of breath.   Cardiovascular: Negative for chest pain.  Musculoskeletal: Positive for myalgias. Negative for arthralgias and gait problem.  Skin: Negative for wound.  Neurological: Positive for numbness.  All other systems reviewed and are negative.    Physical Exam Updated Vital Signs BP 126/85 (BP Location: Right Arm)   Pulse 77   Temp 98.5 F (36.9 C) (Oral)   Resp 16   SpO2 99%   Physical Exam  Constitutional: She is oriented to person, place, and time. She appears well-developed and well-nourished. No distress.  HENT:  Head: Normocephalic and atraumatic.  Eyes: Conjunctivae are normal. Pupils are equal, round, and reactive to light. Right eye exhibits no discharge. Left eye exhibits no discharge. No scleral icterus.  Neck: Normal range of motion.  Cardiovascular: Normal rate and regular rhythm.  Exam reveals no gallop and no friction rub.   No murmur heard. Pulmonary/Chest: Effort normal and breath sounds normal. No respiratory distress. She has no wheezes. She has no rales. She exhibits no tenderness.  Abdominal: She exhibits no distension.  Musculoskeletal:  Right leg: No obvious swelling or deformity. Mild tenderness to palpation of calf. FROM of knee and ankle. -Homan's. N/V intact.   Neurological: She is alert and oriented to person, place, and time.  Skin: Skin is warm and dry.  Psychiatric: She has a normal mood and affect. Her behavior is normal.  Nursing note and vitals reviewed.    ED Treatments / Results  Labs (all labs ordered are listed, but only abnormal results are displayed) Labs Reviewed - No data to display  EKG  EKG Interpretation None       Radiology No results  found.  Procedures Procedures (including critical care time)  Medications Ordered in ED Medications - No data to display   Initial Impression / Assessment and Plan / ED Course  I have reviewed the triage vital signs and the nursing notes.  Pertinent labs & imaging results that were available during my care of the patient were reviewed by me and considered in my medical decision making (see chart for details).  52 year old female presents with right calf pain. Patient is afebrile, not tachycardic or tachypneic, normotensive, and not hypoxic. LE US negative for DVT. Advised continue supportive measures and follow up with PCP. Patient is NAD, non-toxic, with stable VS. Patient is informed of clinical course, understands medical decision making process, and agrees with plan. Opportunity for questions provided and all  questions answered. Return precautions Zarcone.   Final Clinical Impressions(s) / ED Diagnoses   Final diagnoses:  Right calf pain    New Prescriptions New Prescriptions   No medications on file     Bethel Born, PA-C 01/17/17 1511    Pricilla Loveless, MD 01/21/17 [phone]

## 2017-01-17 NOTE — ED Triage Notes (Signed)
Pt reports R calf pain and swelling since yesterday. No known injury. No recent travel, but pt reports she is sedentary for her job. No hx of DVTs. No SOB/ CP.

## 2017-01-18 NOTE — Telephone Encounter (Signed)
Sent to the pharmacy by e-scribe for 90 days.  Last refill should last until mid Feb.  Pt due for follow up May 2018

## 2017-02-15 ENCOUNTER — Other Ambulatory Visit: Payer: Self-pay | Admitting: Internal Medicine

## 2017-02-18 NOTE — Telephone Encounter (Signed)
Sent to the pharmacy by e-scribe. 

## 2017-02-21 ENCOUNTER — Telehealth: Payer: Self-pay | Admitting: Internal Medicine

## 2017-02-21 NOTE — Telephone Encounter (Signed)
Pt need new Rx for Adderall  Pt is aware of 3 business days for refills. °

## 2017-02-21 NOTE — Telephone Encounter (Signed)
Ok to refill x 90 days

## 2017-02-22 MED ORDER — AMPHETAMINE-DEXTROAMPHET ER 20 MG PO CP24: ORAL_CAPSULE | ORAL | 0 refills | Status: DC

## 2017-02-22 MED ORDER — AMPHETAMINE-DEXTROAMPHET ER 20 MG PO CP24: 40.0000 mg | ORAL_CAPSULE | Freq: Every day | ORAL | 0 refills | Status: DC

## 2017-02-22 NOTE — Telephone Encounter (Signed)
Left a message on identified cell informing the pt to pickup at the front desk.  Call back if any questions.

## 2017-02-22 NOTE — Addendum Note (Signed)
Addended by: Raj JanusADKINS, MISTY T on: 02/22/2017 01:41 PM   Modules accepted: Orders

## 2017-03-22 DIAGNOSIS — G4733 Obstructive sleep apnea (adult) (pediatric): Secondary | ICD-10-CM | POA: Diagnosis not present

## 2017-04-18 ENCOUNTER — Other Ambulatory Visit: Payer: Self-pay | Admitting: Internal Medicine

## 2017-04-18 NOTE — Telephone Encounter (Signed)
Sending a month supply for Prozac. Pt is due for a medication check. Please schedule.

## 2017-04-18 NOTE — Telephone Encounter (Signed)
Pt has been sch

## 2017-04-19 NOTE — Progress Notes (Signed)
Chief Complaint  Patient presents with  . Follow-up    pt states heart has been racing/ started two weeks ago     HPI: Victoria Carter 52 y.o.  Comes in for fu med and new problem?   Med check   fluoxetine seems to be helping hasn't been off for a long time and seems to help with depression anxiety sx   Had an episode   2 am    Racing heart . Very fast and lasted 2 hours       No pain ? sob   Couldn't slow down.   eventually went away. No hx of same.   osa :   Took off machine  And not reguarly . Use but will try agion  Was not on machine that night   hx of preg   Extra beat  And had MVP  But.... No other dx   adhd  : Taking medmon through Friday  Helps   prozac   Not sure  Helps some   Inc     Update had leg pain jan and had neg venous doppler   Stress full job  But  Working on it .  ROS: See pertinent positives and negatives per HPI. No syncope  Change in exercise tolerane witch is reported as " not that good"  Past Medical History:  Diagnosis Date  . Allergic rhinitis   . Depression   . Hx of abnormal cervical Pap smear    used cryo when first married and pregnant  . Hyperglycemia   . Hyperlipidemia   . Sinusitis   . UTI (lower urinary tract infection)     Family History  Problem Relation Age of Onset  . Atrial fibrillation Mother   . Heart attack Brother     age 41  . Diabetes type II    . Hyperlipidemia    . Factor V Leiden deficiency Daughter     Also husband    Social History   Social History  . Marital status: Married    Spouse name: N/A  . Number of children: N/A  . Years of education: N/A   Social History Main Topics  . Smoking status: Former Smoker    Quit date: 04/12/2009  . Smokeless tobacco: Never Used  . Alcohol use 0.6 oz/week    1 Glasses of wine per week  . Drug use: No  . Sexual activity: Not Asked   Other Topics Concern  . None   Social History Narrative   Occupation: 40 per week 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  [employer]  In stress position    Married   Regular exercise- yes  yoga   HH of 2-  girls home from college   Pet dogs passed away 05-13-2016   G2P2    Outpatient Medications Prior to Visit  Medication Sig Dispense Refill  . amphetamine-dextroamphetamine (ADDERALL XR) 20 MG 24 hr capsule Take 2 capsules (40 mg total) by mouth daily. 60 capsule 0  . atorvastatin (LIPITOR) 80 MG tablet TAKE 0.5 TABLETS (40 MG TOTAL) BY MOUTH DAILY. 45 tablet 1  . FLUoxetine (PROZAC) 20 MG capsule TAKE ONE CAPSULE BY MOUTH EVERY DAY 90 capsule 0  . ranitidine (ZANTAC) 150 MG tablet TAKE 1 TABLET BY MOUTH TWICE A DAY (Patient taking differently: TAKE 1 TABLET BY MOUTH TWICE A DAY PRN FOR HEARTBURN) 60 tablet 5  . amphetamine-dextroamphetamine (ADDERALL XR) 20 MG 24 hr capsule Take 2 capsules (40 mg total) by mouth  daily. 60 capsule 0  . amphetamine-dextroamphetamine (ADDERALL XR) 20 MG 24 hr capsule Take 2 capsules (40 mg total) by mouth daily. 60 capsule 0   No facility-administered medications prior to visit.      EXAM:  BP 120/80 (BP Location: Right Arm, Patient Position: Sitting, Cuff Size: Normal)   Pulse 76   Temp 97.8 F (36.6 C) (Oral)   Ht 5' 6.4" (1.687 m)   Wt 218 lb (98.9 kg)   BMI 34.76 kg/m   Body mass index is 34.76 kg/m.  GENERAL: vitals reviewed and listed above, alert, oriented, appears well hydrated and in no acute distress HEENT: atraumatic, conjunctiva  clear, no obvious abnormalities on inspection of external nose and ears OP : no lesion edema or exudate  NECK: no obvious masses on inspection palpation  LUNGS: clear to auscultation bilaterally, no wheezes, rales or rhonchi, good air movement CV: HRRR, no clubbing cyanosis or  peripheral edema nl cap refill  No g or m  MS: moves all extremities without noticeable focal  abnormality PSYCH: pleasant and cooperative, no obvious depression  Mild anxiety  Lab Results  Component Value Date   WBC 4.3 11/09/2016   HGB 12.6 11/09/2016   HCT 37.1 11/09/2016   PLT 256.0  11/09/2016   GLUCOSE 112 (H) 11/09/2016   CHOL 188 11/09/2016   TRIG 97.0 11/09/2016   HDL 50.30 11/09/2016   LDLDIRECT 88.6 11/01/2014   LDLCALC 118 (H) 11/09/2016   ALT 22 11/09/2016   AST 18 11/09/2016   NA 141 11/09/2016   K 4.2 11/09/2016   CL 106 11/09/2016   CREATININE 0.97 11/09/2016   BUN 17 11/09/2016   CO2 26 11/09/2016   TSH 4.36 11/09/2016   HGBA1C 5.6 11/16/2016   EKG nsr  Nl intervals nl QT  one PVC see document  ASSESSMENT AND PLAN:  Discussed the following assessment and plan:  Attention deficit hyperactivity disorder (ADHD), combined type  Medication management  Palpitation - Plan: EKG 12-Lead  OSA (obstructive sleep apnea) Discussed options of seeing cardiology certainly optimize her sleep apnea support treatment. If she is having recurrent racing heart get back with [place] we may get cardiology involved. She feels doing ok    And can wait and see how does with  OSA support . She does have a family history of atrial fibrillation and her mother. And MI? In brother at early age  58  Benefit more than risk of medications  to continue. For adhd   Stay on fluoxetine  Have pharmacy send electronic request for refills   Expectant management.  -Patient advised to return or notify health care team  if symptoms worsen ,persist or new concerns arise.  Patient Instructions  Attention  To treating your sleep apnea   And  healthy eating and lifestyle. If  Racing heart palpitaiions recurring  We can get cardiology to evaluate for rhythym issues .   At this time no change in meds   cpx in  Full labs  In November  2017   Have pharmacy contact us electronically when due for refills.       Neta Mends. Panosh M.D.

## 2017-04-22 ENCOUNTER — Ambulatory Visit (INDEPENDENT_AMBULATORY_CARE_PROVIDER_SITE_OTHER): Payer: BLUE CROSS/BLUE SHIELD | Admitting: Internal Medicine

## 2017-04-22 ENCOUNTER — Encounter: Payer: Self-pay | Admitting: Internal Medicine

## 2017-04-22 VITALS — BP 120/80 | HR 76 | Temp 97.8°F | Ht 66.4 in | Wt 218.0 lb

## 2017-04-22 DIAGNOSIS — F902 Attention-deficit hyperactivity disorder, combined type: Secondary | ICD-10-CM | POA: Diagnosis not present

## 2017-04-22 DIAGNOSIS — Z79899 Other long term (current) drug therapy: Secondary | ICD-10-CM | POA: Diagnosis not present

## 2017-04-22 DIAGNOSIS — R002 Palpitations: Secondary | ICD-10-CM | POA: Diagnosis not present

## 2017-04-22 DIAGNOSIS — G4733 Obstructive sleep apnea (adult) (pediatric): Secondary | ICD-10-CM | POA: Diagnosis not present

## 2017-04-22 MED ORDER — AMPHETAMINE-DEXTROAMPHET ER 20 MG PO CP24: ORAL_CAPSULE | ORAL | 0 refills | Status: DC

## 2017-04-22 MED ORDER — AMPHETAMINE-DEXTROAMPHET ER 20 MG PO CP24: 40.0000 mg | ORAL_CAPSULE | Freq: Every day | ORAL | 0 refills | Status: DC

## 2017-04-22 NOTE — Patient Instructions (Addendum)
Attention  To treating your sleep apnea   And  healthy eating and lifestyle. If  Racing heart palpitaiions recurring  We can get cardiology to evaluate for rhythym issues .   At this time no change in meds   cpx in  Full labs  In November  2017   Have pharmacy contact us electronically when due for refills.

## 2017-05-21 ENCOUNTER — Other Ambulatory Visit: Payer: Self-pay | Admitting: Internal Medicine

## 2017-05-23 DIAGNOSIS — G4733 Obstructive sleep apnea (adult) (pediatric): Secondary | ICD-10-CM | POA: Diagnosis not present

## 2017-06-18 ENCOUNTER — Encounter: Payer: Self-pay | Admitting: Internal Medicine

## 2017-06-20 NOTE — Telephone Encounter (Signed)
Victoria Carter please  Have someone do a PS or whatever is needed

## 2017-06-21 DIAGNOSIS — M79672 Pain in left foot: Secondary | ICD-10-CM | POA: Diagnosis not present

## 2017-06-21 DIAGNOSIS — M722 Plantar fascial fibromatosis: Secondary | ICD-10-CM | POA: Diagnosis not present

## 2017-06-24 DIAGNOSIS — G4733 Obstructive sleep apnea (adult) (pediatric): Secondary | ICD-10-CM | POA: Diagnosis not present

## 2017-07-09 ENCOUNTER — Telehealth: Payer: Self-pay | Admitting: Internal Medicine

## 2017-07-09 DIAGNOSIS — M79672 Pain in left foot: Secondary | ICD-10-CM | POA: Diagnosis not present

## 2017-07-09 DIAGNOSIS — M722 Plantar fascial fibromatosis: Secondary | ICD-10-CM | POA: Diagnosis not present

## 2017-07-09 NOTE — Telephone Encounter (Signed)
PA not needed per insurance. Message sent to patient.

## 2017-07-09 NOTE — Telephone Encounter (Signed)
Pt request refill amphetamine-dextroamphetamine (ADDERALL XR) 20 MG 24 hr capsule  Pt would like to pick up Thurs when she gets the shingrix vaccine  Pt sates this also needs a prior.thanks

## 2017-07-09 NOTE — Telephone Encounter (Signed)
Pending. Key: [id number]

## 2017-07-10 MED ORDER — AMPHETAMINE-DEXTROAMPHET ER 20 MG PO CP24: ORAL_CAPSULE | ORAL | 0 refills | Status: DC

## 2017-07-10 MED ORDER — AMPHETAMINE-DEXTROAMPHET ER 20 MG PO CP24: 40.0000 mg | ORAL_CAPSULE | Freq: Every day | ORAL | 0 refills | Status: DC

## 2017-07-10 NOTE — Telephone Encounter (Signed)
I printed a prescription. It is certainly possible that her insurance will only okay 30 items a month and asked why they rejecting it the prescription is written for 60. Please contact patient and told her I wrote the prescription.

## 2017-07-11 ENCOUNTER — Ambulatory Visit (INDEPENDENT_AMBULATORY_CARE_PROVIDER_SITE_OTHER): Payer: BLUE CROSS/BLUE SHIELD

## 2017-07-11 DIAGNOSIS — Z23 Encounter for immunization: Secondary | ICD-10-CM

## 2017-07-17 ENCOUNTER — Other Ambulatory Visit: Payer: Self-pay | Admitting: Internal Medicine

## 2017-07-17 NOTE — Progress Notes (Signed)
Chief Complaint  Patient presents with  . Gynecologic Exam    sti screen  stress    HPI: Victoria Carter 52 y.o. sda Comes in today for STI screening on advice of her sister. On July 9 her husband left her and that marriage somewhat unexpectedly She had been having stress at work and some at home and now she feels pretty devastated. Has no specific vaginal symptoms today but is concerned Regard to her emotional state states it is hard for her to work she does work from home. Had daughter for support   Hard to sleep asks if something to help calm down at times. No recent reg etoh use.  rd Support sis in [place], parents in [place]. ROS: See pertinent positives and negatives per HPI.  Past Medical History:  Diagnosis Date  . Allergic rhinitis   . Depression   . Hx of abnormal cervical Pap smear    used cryo when first married and pregnant  . Hyperglycemia   . Hyperlipidemia   . Sinusitis   . UTI (lower urinary tract infection)     Family History  Problem Relation Age of Onset  . Atrial fibrillation Mother   . Heart attack Brother        age 48  . Diabetes type II Unknown   . Hyperlipidemia Unknown   . Factor V Leiden deficiency Daughter        Also husband    Social History   Social History  . Marital status: Married    Spouse name: N/A  . Number of children: N/A  . Years of education: N/A   Social History Main Topics  . Smoking status: Former Smoker    Quit date: 04/12/2009  . Smokeless tobacco: Never Used  . Alcohol use 0.6 oz/week    1 Glasses of wine per week  . Drug use: No  . Sexual activity: Not Asked   Other Topics Concern  . None   Social History Narrative   Occupation: 40 per week 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  [employer]  In stress position    Married   Regular exercise- yes  yoga   HH of 2-  girls home from college   Pet dogs passed away 2016-03-02   G2P2    Outpatient Medications Prior to Visit  Medication Sig Dispense Refill  . amphetamine-dextroamphetamine (ADDERALL  XR) 20 MG 24 hr capsule Take 2 capsules (40 mg total) by mouth daily. 60 capsule 0  . amphetamine-dextroamphetamine (ADDERALL XR) 20 MG 24 hr capsule Take 2 capsules (40 mg total) by mouth daily. 60 capsule 0  . amphetamine-dextroamphetamine (ADDERALL XR) 20 MG 24 hr capsule Take 2 capsules (40 mg total) by mouth daily. 60 capsule 0  . atorvastatin (LIPITOR) 80 MG tablet TAKE 0.5 TABLETS (40 MG TOTAL) BY MOUTH DAILY. 45 tablet 1  . ranitidine (ZANTAC) 150 MG tablet TAKE 1 TABLET BY MOUTH TWICE A DAY 60 tablet 5  . FLUoxetine (PROZAC) 20 MG capsule TAKE ONE CAPSULE BY MOUTH EVERY DAY 90 capsule 0   No facility-administered medications prior to visit.      EXAM:  BP (!) 130/100 (BP Location: Right Arm, Patient Position: Sitting, Cuff Size: Normal)   Pulse 100   Temp 98.2 F (36.8 C) (Oral)   Wt 216 lb 6.4 oz (98.2 kg)   BMI 34.51 kg/m   Body mass index is 34.51 kg/m.  GENERAL: vitals reviewed and listed above, alert, oriented, appears well hydrated and in  Somewhat emotional distress but nl thought process   Emotional at times  HEENT: atraumatic, conjunctiva  clear, no obvious abnormalities on inspection of external nose and ears  NECK: no obvious masses on inspection palpation  CV: HRRR, no clubbing cyanosis or  peripheral edema nl cap refill  Pelvic: NL ext GU, labia clear without lesions or rash . Vagina no lesions .Cervix: clear  UTERUS: Neg CMT Adnexa:  clear no masses . PAP done gc chl yeast trick bv  MS: moves all extremities without noticeable focal  abnormality PSYCH:  coherent   Nl speech . Sobbing at times but  Appropriate for  Situation.    ASSESSMENT AND PLAN:  Discussed the following assessment and plan:  Acute reaction to stress  Encounter for gynecological examination without abnormal finding - Plan: Cervicovaginal ancillary only  Routine screening for STI (sexually transmitted infection) - Plan: RPR, HIV antibody, Cervicovaginal ancillary only  Medication  management reviewed and disc situation  Inc prozac to 40 per day  cautious use of Ativan if needed Information about counseling. Use resources expectant management. Her exam today is reassuring. Follow-up in about a month about men check and how she is doing. We'll let her know results in the interim. -Patient advised to return or notify health care team  if symptoms worsen ,persist or new concerns arise. Total visit 69mns > 50% spent counseling and coordinating care as indicated in above note and in instructions to patient .    Patient Instructions   Will let you know or lab when available.  Last pap was 2016 and normal neg HRHPV.    sounseling may help get through  .  increaese fluoextine to 40 mg per day  Can add on as needed ativan for anxiety attacks   But no alcohol . With this . ROV in 1 months or as needed for med check     Stress and Stress Management Stress is a normal reaction to life events. It is what you feel when life demands more than you are used to or more than you can handle. Some stress can be useful. For example, the stress reaction can help you catch the last bus of the day, study for a test, or meet a deadline at work. But stress that occurs too often or for too long can cause problems. It can affect your emotional health and interfere with relationships and normal daily activities. Too much stress can weaken your immune system and increase your risk for physical illness. If you already have a medical problem, stress can make it worse. What are the causes? All sorts of life events may cause stress. An event that causes stress for one person may not be stressful for another person. Major life events commonly cause stress. These may be positive or negative. Examples include losing your job, moving into a new home, getting married, having a baby, or losing a loved one. Less obvious life events may also cause stress, especially if they occur day after day or in  combination. Examples include working long hours, driving in traffic, caring for children, being in debt, or being in a difficult relationship. What are the signs or symptoms? Stress may cause emotional symptoms including, the following:  Anxiety. This is feeling worried, afraid, on edge, overwhelmed, or out of control.  Anger. This is feeling irritated or impatient.  Depression. This is feeling sad, down, helpless, or guilty.  Difficulty focusing, remembering, or making decisions.  Stress may cause physical symptoms, including  the following:  Aches and pains. These may affect your head, neck, back, stomach, or other areas of your body.  Tight muscles or clenched jaw.  Low energy or trouble sleeping.  Stress may cause unhealthy behaviors, including the following:  Eating to feel better (overeating) or skipping meals.  Sleeping too little, too much, or both.  Working too much or putting off tasks (procrastination).  Smoking, drinking alcohol, or using drugs to feel better.  How is this diagnosed? Stress is diagnosed through an assessment by your health care provider. Your health care provider will ask questions about your symptoms and any stressful life events.Your health care provider will also ask about your medical history and may order blood tests or other tests. Certain medical conditions and medicine can cause physical symptoms similar to stress. Mental illness can cause emotional symptoms and unhealthy behaviors similar to stress. Your health care provider may refer you to a mental health professional for further evaluation. How is this treated? Stress management is the recommended treatment for stress.The goals of stress management are reducing stressful life events and coping with stress in healthy ways. Techniques for reducing stressful life events include the following:  Stress identification. Self-monitor for stress and identify what causes stress for you. These  skills may help you to avoid some stressful events.  Time management. Set your priorities, keep a calendar of events, and learn to say "no." These tools can help you avoid making too many commitments.  Techniques for coping with stress include the following:  Rethinking the problem. Try to think realistically about stressful events rather than ignoring them or overreacting. Try to find the positives in a stressful situation rather than focusing on the negatives.  Exercise. Physical exercise can release both physical and emotional tension. The key is to find a form of exercise you enjoy and do it regularly.  Relaxation techniques. These relax the body and mind. Examples include yoga, meditation, tai chi, biofeedback, deep breathing, progressive muscle relaxation, listening to music, being out in nature, journaling, and other hobbies. Again, the key is to find one or more that you enjoy and can do regularly.  Healthy lifestyle. Eat a balanced diet, get plenty of sleep, and do not smoke. Avoid using alcohol or drugs to relax.  Strong support network. Spend time with family, friends, or other people you enjoy being around.Express your feelings and talk things over with someone you trust.  Counseling or talktherapy with a mental health professional may be helpful if you are having difficulty managing stress on your own. Medicine is typically not recommended for the treatment of stress.Talk to your health care provider if you think you need medicine for symptoms of stress. Follow these instructions at home:  Keep all follow-up visits as directed by your health care provider.  Take all medicines as directed by your health care provider. Contact a health care provider if:  Your symptoms get worse or you start having new symptoms.  You feel overwhelmed by your problems and can no longer manage them on your own. Get help right away if:  You feel like hurting yourself or someone else. This  information is not intended to replace advice [place] to you by your health care provider. Make sure you discuss any questions you have with your health care provider. Document Released: 06/12/2001 Document Revised: 05/24/2016 Document Reviewed: 08/11/2013 Elsevier Interactive Patient Education  2017 [place] K. Panosh M.D.

## 2017-07-18 ENCOUNTER — Encounter: Payer: Self-pay | Admitting: Internal Medicine

## 2017-07-18 ENCOUNTER — Ambulatory Visit (INDEPENDENT_AMBULATORY_CARE_PROVIDER_SITE_OTHER): Payer: BLUE CROSS/BLUE SHIELD | Admitting: Internal Medicine

## 2017-07-18 ENCOUNTER — Other Ambulatory Visit (HOSPITAL_COMMUNITY)
Admission: RE | Admit: 2017-07-18 | Discharge: 2017-07-18 | Disposition: A | Discharge status: Home / Self Care | Payer: BLUE CROSS/BLUE SHIELD | Source: Ambulatory Visit | Attending: Internal Medicine | Admitting: Internal Medicine

## 2017-07-18 VITALS — BP 130/100 | HR 100 | Temp 98.2°F | Wt 216.4 lb

## 2017-07-18 DIAGNOSIS — F430 Acute stress reaction: Secondary | ICD-10-CM | POA: Diagnosis not present

## 2017-07-18 DIAGNOSIS — Z87891 Personal history of nicotine dependence: Secondary | ICD-10-CM | POA: Diagnosis not present

## 2017-07-18 DIAGNOSIS — Z113 Encounter for screening for infections with a predominantly sexual mode of transmission: Secondary | ICD-10-CM | POA: Diagnosis not present

## 2017-07-18 DIAGNOSIS — Z01419 Encounter for gynecological examination (general) (routine) without abnormal findings: Secondary | ICD-10-CM | POA: Insufficient documentation

## 2017-07-18 DIAGNOSIS — F329 Major depressive disorder, single episode, unspecified: Secondary | ICD-10-CM | POA: Insufficient documentation

## 2017-07-18 DIAGNOSIS — E785 Hyperlipidemia, unspecified: Secondary | ICD-10-CM | POA: Insufficient documentation

## 2017-07-18 DIAGNOSIS — Z79899 Other long term (current) drug therapy: Secondary | ICD-10-CM

## 2017-07-18 MED ORDER — LORAZEPAM 0.5 MG PO TABS: 0.5000 mg | ORAL_TABLET | Freq: Two times a day (BID) | ORAL | 0 refills | Status: DC | PRN

## 2017-07-18 MED ORDER — FLUOXETINE HCL 20 MG PO CAPS: 40.0000 mg | ORAL_CAPSULE | Freq: Every day | ORAL | 3 refills | Status: DC

## 2017-07-18 NOTE — Patient Instructions (Addendum)
Will let you know or lab when available.  Last pap was 2016 and normal neg HRHPV.    sounseling may help get through  .  increaese fluoextine to 40 mg per day  Can add on as needed ativan for anxiety attacks   But no alcohol . With this . ROV in 1 months or as needed for med check     Stress and Stress Management Stress is a normal reaction to life events. It is what you feel when life demands more than you are used to or more than you can handle. Some stress can be useful. For example, the stress reaction can help you catch the last bus of the day, study for a test, or meet a deadline at work. But stress that occurs too often or for too long can cause problems. It can affect your emotional health and interfere with relationships and normal daily activities. Too much stress can weaken your immune system and increase your risk for physical illness. If you already have a medical problem, stress can make it worse. What are the causes? All sorts of life events may cause stress. An event that causes stress for one person may not be stressful for another person. Major life events commonly cause stress. These may be positive or negative. Examples include losing your job, moving into a new home, getting married, having a baby, or losing a loved one. Less obvious life events may also cause stress, especially if they occur day after day or in combination. Examples include working long hours, driving in traffic, caring for children, being in debt, or being in a difficult relationship. What are the signs or symptoms? Stress may cause emotional symptoms including, the following:  Anxiety. This is feeling worried, afraid, on edge, overwhelmed, or out of control.  Anger. This is feeling irritated or impatient.  Depression. This is feeling sad, down, helpless, or guilty.  Difficulty focusing, remembering, or making decisions.  Stress may cause physical symptoms, including the following:  Aches and  pains. These may affect your head, neck, back, stomach, or other areas of your body.  Tight muscles or clenched jaw.  Low energy or trouble sleeping.  Stress may cause unhealthy behaviors, including the following:  Eating to feel better (overeating) or skipping meals.  Sleeping too little, too much, or both.  Working too much or putting off tasks (procrastination).  Smoking, drinking alcohol, or using drugs to feel better.  How is this diagnosed? Stress is diagnosed through an assessment by your health care provider. Your health care provider will ask questions about your symptoms and any stressful life events.Your health care provider will also ask about your medical history and may order blood tests or other tests. Certain medical conditions and medicine can cause physical symptoms similar to stress. Mental illness can cause emotional symptoms and unhealthy behaviors similar to stress. Your health care provider may refer you to a mental health professional for further evaluation. How is this treated? Stress management is the recommended treatment for stress.The goals of stress management are reducing stressful life events and coping with stress in healthy ways. Techniques for reducing stressful life events include the following:  Stress identification. Self-monitor for stress and identify what causes stress for you. These skills may help you to avoid some stressful events.  Time management. Set your priorities, keep a calendar of events, and learn to say "no." These tools can help you avoid making too many commitments.  Techniques for coping with stress include  the following:  Rethinking the problem. Try to think realistically about stressful events rather than ignoring them or overreacting. Try to find the positives in a stressful situation rather than focusing on the negatives.  Exercise. Physical exercise can release both physical and emotional tension. The key is to find a form  of exercise you enjoy and do it regularly.  Relaxation techniques. These relax the body and mind. Examples include yoga, meditation, tai chi, biofeedback, deep breathing, progressive muscle relaxation, listening to music, being out in nature, journaling, and other hobbies. Again, the key is to find one or more that you enjoy and can do regularly.  Healthy lifestyle. Eat a balanced diet, get plenty of sleep, and do not smoke. Avoid using alcohol or drugs to relax.  Strong support network. Spend time with family, friends, or other people you enjoy being around.Express your feelings and talk things over with someone you trust.  Counseling or talktherapy with a mental health professional may be helpful if you are having difficulty managing stress on your own. Medicine is typically not recommended for the treatment of stress.Talk to your health care provider if you think you need medicine for symptoms of stress. Follow these instructions at home:  Keep all follow-up visits as directed by your health care provider.  Take all medicines as directed by your health care provider. Contact a health care provider if:  Your symptoms get worse or you start having new symptoms.  You feel overwhelmed by your problems and can no longer manage them on your own. Get help right away if:  You feel like hurting yourself or someone else. This information is not intended to replace advice [place] to you by your health care provider. Make sure you discuss any questions you have with your health care provider. Document Released: 06/12/2001 Document Revised: 05/24/2016 Document Reviewed: 08/11/2013 Elsevier Interactive Patient Education  2017 [employer].

## 2017-07-19 LAB — RPR

## 2017-07-19 LAB — HIV ANTIBODY (ROUTINE TESTING W REFLEX): HIV 1&2 Ab, 4th Generation: NONREACTIVE

## 2017-07-19 LAB — CERVICOVAGINAL ANCILLARY ONLY
Bacterial vaginitis: NEGATIVE
Candida vaginitis: NEGATIVE
Chlamydia: NEGATIVE
Neisseria Gonorrhea: NEGATIVE
Trichomonas: NEGATIVE

## 2017-07-30 DIAGNOSIS — M722 Plantar fascial fibromatosis: Secondary | ICD-10-CM | POA: Diagnosis not present

## 2017-07-30 DIAGNOSIS — M79672 Pain in left foot: Secondary | ICD-10-CM | POA: Diagnosis not present

## 2017-08-07 DIAGNOSIS — G4733 Obstructive sleep apnea (adult) (pediatric): Secondary | ICD-10-CM | POA: Diagnosis not present

## 2017-08-17 ENCOUNTER — Other Ambulatory Visit: Payer: Self-pay | Admitting: Internal Medicine

## 2017-08-20 NOTE — Progress Notes (Signed)
Chief Complaint  Patient presents with  . Follow-up    HPI: Flornce Carter 52 y.o. come in for  Fu meds   Acute rx pt stress  In fluoxextine to 40 prn ,ativan  Counseling   doing better work still stressful    Not using ativan much .  Divorce papers being served.  needs refill adderall    Will run out .  Still helping   ROS: See pertinent positives and negatives per HPI.  Past Medical History:  Diagnosis Date  . Allergic rhinitis   . Depression   . Hx of abnormal cervical Pap smear    used cryo when first married and pregnant  . Hyperglycemia   . Hyperlipidemia   . Sinusitis   . UTI (lower urinary tract infection)     Family History  Problem Relation Age of Onset  . Atrial fibrillation Mother   . Heart attack Brother        age 51  . Diabetes type II Unknown   . Hyperlipidemia Unknown   . Factor V Leiden deficiency Daughter        Also husband    Social History   Social History  . Marital status: Married    Spouse name: N/A  . Number of children: N/A  . Years of education: N/A   Social History Main Topics  . Smoking status: Former Smoker    Quit date: 04/12/2009  . Smokeless tobacco: Never Used  . Alcohol use 0.6 oz/week    1 Glasses of wine per week  . Drug use: No  . Sexual activity: Not Asked   Other Topics Concern  . None   Social History Narrative   Occupation: 40 per week 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  [employer]  In stress position    Married   Regular exercise- yes  yoga   HH of 2-  girls home from college   Pet dogs passed away 05-20-2016   G2P2    Outpatient Medications Prior to Visit  Medication Sig Dispense Refill  . amphetamine-dextroamphetamine (ADDERALL XR) 20 MG 24 hr capsule Take 2 capsules (40 mg total) by mouth daily. 60 capsule 0  . amphetamine-dextroamphetamine (ADDERALL XR) 20 MG 24 hr capsule Take 2 capsules (40 mg total) by mouth daily. 60 capsule 0  . atorvastatin (LIPITOR) 80 MG tablet TAKE 0.5 TABLETS (40 MG TOTAL) BY MOUTH DAILY. 45 tablet 1    . FLUoxetine (PROZAC) 20 MG capsule Take 2 capsules (40 mg total) by mouth daily. 60 capsule 3  . LORazepam (ATIVAN) 0.5 MG tablet Take 1-2 tablets (0.5-1 mg total) by mouth 2 (two) times daily as needed for anxiety. 24 tablet 0  . ranitidine (ZANTAC) 150 MG tablet TAKE 1 TABLET BY MOUTH TWICE A DAY 60 tablet 5  . amphetamine-dextroamphetamine (ADDERALL XR) 20 MG 24 hr capsule Take 2 capsules (40 mg total) by mouth daily. 60 capsule 0   No facility-administered medications prior to visit.      EXAM:  BP 130/80 (BP Location: Right Arm, Patient Position: Sitting, Cuff Size: Normal)   Pulse 88   Temp 98.3 F (36.8 C) (Oral)   Wt 204 lb 4.8 oz (92.7 kg)   BMI 32.58 kg/m   Body mass index is 32.58 kg/m.  GENERAL: vitals reviewed and listed above, alert, oriented, appears well hydrated and in no acute distress mild anxiety  Excess motor activity  HEENT: atraumatic, conjunctiva  clear, no obvious abnormalities on inspection of external nose and  ears PSYCH: pleasant and cooperative,  Nl   Thought and  Speech   BP Readings from Last 3 Encounters:  08/21/17 130/80  07/18/17 (!) 130/100  04/22/17 120/80    ASSESSMENT AND PLAN:  Discussed the following assessment and plan:  Acute reaction to stress  Medication management  Attention deficit hyperactivity disorder (ADHD), combined type Advise cont med counseling   Fu 1-2 mos  Refill adderall x 1  (Will be due for labs in 2-3 mos  ) contact us ion interim if needed    Expectant management.  -Patient advised to return or notify health care team  if  new concerns arise.  Patient Instructions  Continue  With fluoxetine 40 mg per day .   Counseling .  Advised  .    Plan ROV in  1-2 months or as needed.          Neta Mends. Nazia Rhines M.D.

## 2017-08-21 ENCOUNTER — Ambulatory Visit (INDEPENDENT_AMBULATORY_CARE_PROVIDER_SITE_OTHER): Payer: BLUE CROSS/BLUE SHIELD | Admitting: Internal Medicine

## 2017-08-21 ENCOUNTER — Encounter: Payer: Self-pay | Admitting: Internal Medicine

## 2017-08-21 VITALS — BP 130/80 | HR 88 | Temp 98.3°F | Wt 204.3 lb

## 2017-08-21 DIAGNOSIS — Z79899 Other long term (current) drug therapy: Secondary | ICD-10-CM

## 2017-08-21 DIAGNOSIS — F902 Attention-deficit hyperactivity disorder, combined type: Secondary | ICD-10-CM

## 2017-08-21 DIAGNOSIS — F430 Acute stress reaction: Secondary | ICD-10-CM | POA: Diagnosis not present

## 2017-08-21 MED ORDER — AMPHETAMINE-DEXTROAMPHET ER 20 MG PO CP24: 40.0000 mg | ORAL_CAPSULE | Freq: Every day | ORAL | 0 refills | Status: DC

## 2017-08-21 NOTE — Patient Instructions (Signed)
Continue  With fluoxetine 40 mg per day .   Counseling .  Advised  .    Plan ROV in  1-2 months or as needed.

## 2017-09-05 DIAGNOSIS — M21611 Bunion of right foot: Secondary | ICD-10-CM | POA: Diagnosis not present

## 2017-09-05 DIAGNOSIS — M79672 Pain in left foot: Secondary | ICD-10-CM | POA: Diagnosis not present

## 2017-09-05 DIAGNOSIS — M722 Plantar fascial fibromatosis: Secondary | ICD-10-CM | POA: Diagnosis not present

## 2017-09-05 DIAGNOSIS — M21612 Bunion of left foot: Secondary | ICD-10-CM | POA: Diagnosis not present

## 2017-09-06 DIAGNOSIS — G4733 Obstructive sleep apnea (adult) (pediatric): Secondary | ICD-10-CM | POA: Diagnosis not present

## 2017-09-09 ENCOUNTER — Other Ambulatory Visit: Payer: Self-pay | Admitting: Internal Medicine

## 2017-09-09 DIAGNOSIS — Z1231 Encounter for screening mammogram for malignant neoplasm of breast: Secondary | ICD-10-CM

## 2017-09-10 DIAGNOSIS — M722 Plantar fascial fibromatosis: Secondary | ICD-10-CM | POA: Diagnosis not present

## 2017-09-11 ENCOUNTER — Ambulatory Visit (INDEPENDENT_AMBULATORY_CARE_PROVIDER_SITE_OTHER): Payer: BLUE CROSS/BLUE SHIELD | Admitting: Emergency Medicine

## 2017-09-11 DIAGNOSIS — Z23 Encounter for immunization: Secondary | ICD-10-CM | POA: Diagnosis not present

## 2017-09-12 DIAGNOSIS — M722 Plantar fascial fibromatosis: Secondary | ICD-10-CM | POA: Diagnosis not present

## 2017-09-16 ENCOUNTER — Telehealth: Payer: Self-pay | Admitting: Internal Medicine

## 2017-09-16 NOTE — Telephone Encounter (Signed)
Received PA request for Amphetamine-dextroamphetamine. PA submitted & is pending. Key:  [id number]

## 2017-09-16 NOTE — Telephone Encounter (Signed)
PA approved, form faxed back to pharmacy. 

## 2017-09-17 DIAGNOSIS — L723 Sebaceous cyst: Secondary | ICD-10-CM | POA: Diagnosis not present

## 2017-09-17 DIAGNOSIS — D1800 Hemangioma unspecified site: Secondary | ICD-10-CM | POA: Diagnosis not present

## 2017-09-17 DIAGNOSIS — L820 Inflamed seborrheic keratosis: Secondary | ICD-10-CM | POA: Diagnosis not present

## 2017-09-17 DIAGNOSIS — D225 Melanocytic nevi of trunk: Secondary | ICD-10-CM | POA: Diagnosis not present

## 2017-09-17 DIAGNOSIS — D2271 Melanocytic nevi of right lower limb, including hip: Secondary | ICD-10-CM | POA: Diagnosis not present

## 2017-09-18 DIAGNOSIS — G4733 Obstructive sleep apnea (adult) (pediatric): Secondary | ICD-10-CM | POA: Diagnosis not present

## 2017-09-19 DIAGNOSIS — M722 Plantar fascial fibromatosis: Secondary | ICD-10-CM | POA: Diagnosis not present

## 2017-09-20 ENCOUNTER — Encounter: Payer: Self-pay | Admitting: Internal Medicine

## 2017-10-01 DIAGNOSIS — F509 Eating disorder, unspecified: Secondary | ICD-10-CM | POA: Diagnosis not present

## 2017-10-07 DIAGNOSIS — G4733 Obstructive sleep apnea (adult) (pediatric): Secondary | ICD-10-CM | POA: Diagnosis not present

## 2017-10-09 DIAGNOSIS — F509 Eating disorder, unspecified: Secondary | ICD-10-CM | POA: Diagnosis not present

## 2017-10-16 DIAGNOSIS — F509 Eating disorder, unspecified: Secondary | ICD-10-CM | POA: Diagnosis not present

## 2017-10-21 ENCOUNTER — Ambulatory Visit
Admission: RE | Admit: 2017-10-21 | Discharge: 2017-10-21 | Disposition: A | Discharge status: Home / Self Care | Payer: BLUE CROSS/BLUE SHIELD | Source: Ambulatory Visit | Attending: Internal Medicine | Admitting: Internal Medicine

## 2017-10-21 DIAGNOSIS — Z1231 Encounter for screening mammogram for malignant neoplasm of breast: Secondary | ICD-10-CM

## 2017-10-24 DIAGNOSIS — F509 Eating disorder, unspecified: Secondary | ICD-10-CM | POA: Diagnosis not present

## 2017-10-28 DIAGNOSIS — M79672 Pain in left foot: Secondary | ICD-10-CM | POA: Diagnosis not present

## 2017-10-28 DIAGNOSIS — M722 Plantar fascial fibromatosis: Secondary | ICD-10-CM | POA: Diagnosis not present

## 2017-10-28 DIAGNOSIS — Z01818 Encounter for other preprocedural examination: Secondary | ICD-10-CM | POA: Diagnosis not present

## 2017-10-29 LAB — BASIC METABOLIC PANEL
Creatinine: 1 (ref 0.5–1.1)
Glucose: 115
Potassium: 4.5 (ref 3.4–5.3)
Sodium: 140 (ref 137–147)

## 2017-10-29 LAB — CBC AND DIFFERENTIAL
HCT: 42 (ref 36–46)
Hemoglobin: 14.5 (ref 12.0–16.0)
Platelets: 301 (ref 150–399)
WBC: 6.6

## 2017-10-29 LAB — HEPATIC FUNCTION PANEL
ALT: 51 — AB (ref 7–35)
AST: 36 — AB (ref 13–35)
Alkaline Phosphatase: 76 (ref 25–125)
Bilirubin, Total: 0.5

## 2017-10-29 LAB — LIPID PANEL: LDl/HDL Ratio: 4336

## 2017-10-31 DIAGNOSIS — F509 Eating disorder, unspecified: Secondary | ICD-10-CM | POA: Diagnosis not present

## 2017-11-04 DIAGNOSIS — M722 Plantar fascial fibromatosis: Secondary | ICD-10-CM | POA: Diagnosis not present

## 2017-11-06 DIAGNOSIS — F509 Eating disorder, unspecified: Secondary | ICD-10-CM | POA: Diagnosis not present

## 2017-11-11 ENCOUNTER — Other Ambulatory Visit: Payer: Self-pay | Admitting: Internal Medicine

## 2017-11-12 NOTE — Telephone Encounter (Signed)
Medication filled to pharmacy as requested.   

## 2017-11-13 ENCOUNTER — Telehealth: Payer: Self-pay | Admitting: *Deleted

## 2017-11-13 NOTE — Telephone Encounter (Signed)
Copied from CRM #[id number]. Topic: General - Other >> Nov 13, 2017 12:14 PM Percival SpanishKennedy, Cheryl W wrote: Reason for CRM: pt req refill on amphetamine-dextroamphetamine (ADDERALL XR) 20 MG 24 hr capsule

## 2017-11-14 MED ORDER — AMPHETAMINE-DEXTROAMPHET ER 20 MG PO CP24: ORAL_CAPSULE | ORAL | 0 refills | Status: DC

## 2017-11-14 MED ORDER — AMPHETAMINE-DEXTROAMPHET ER 20 MG PO CP24: 40.0000 mg | ORAL_CAPSULE | Freq: Every day | ORAL | 0 refills | Status: DC

## 2017-11-14 NOTE — Telephone Encounter (Signed)
Refill request for Medication: Adderall XR 20mg  Last Filled: 07/10/17, #60 x 2 rx's   08/21/17 #60 x 1 rx Previous / Upcoming Appt: last seen 08/21/17  Please advise Dr Fabian SharpPanosh, thanks.

## 2017-11-14 NOTE — Telephone Encounter (Signed)
Pt aware that Rx's printed and placed up front. Nothing further needed.

## 2017-11-14 NOTE — Telephone Encounter (Signed)
Due for  ROV   In next month    Ok to refill x  60 days and have her get ROV med check appt before runs out

## 2017-11-19 DIAGNOSIS — G4733 Obstructive sleep apnea (adult) (pediatric): Secondary | ICD-10-CM | POA: Diagnosis not present

## 2017-11-19 DIAGNOSIS — F509 Eating disorder, unspecified: Secondary | ICD-10-CM | POA: Diagnosis not present

## 2017-11-28 NOTE — Progress Notes (Signed)
Chief Complaint  Patient presents with  . Medication Refill    Adderall    HPI: Victoria Carter 52 y.o. come in for Chronic disease management   adhd   Last rx adderall was a pink pill instead of blue around nov 17 and doesn't t seem to work as well.   Anxiety etc is getting better   Weekly and qo weekly counseling dr Cyndia SkeetersLurey and is learning to cope at work  Until can get a better job.   Living alone ok  Not using ativan sleep off some but getting better and still on fluoxetine 40 mg   6-7 hours  Ate today  Taking meds no se noted   Right foot surgery boot  Getting better  Says she had stox screen pre op ROS: See pertinent positives and negatives per HPI.  Past Medical History:  Diagnosis Date  . Allergic rhinitis   . Depression   . Hx of abnormal cervical Pap smear    used cryo when first married and pregnant  . Hyperglycemia   . Hyperlipidemia   . Sinusitis   . UTI (lower urinary tract infection)     Family History  Problem Relation Age of Onset  . Atrial fibrillation Mother   . Heart attack Brother        age 53  . Diabetes type II Unknown   . Hyperlipidemia Unknown   . Factor V Leiden deficiency Daughter        Also husband    Social History   Socioeconomic History  . Marital status: Legally Separated    Spouse name: None  . Number of children: None  . Years of education: None  . Highest education level: None  Social Needs  . Financial resource strain: None  . Food insecurity - worry: None  . Food insecurity - inability: None  . Transportation needs - medical: None  . Transportation needs - non-medical: None  Occupational History  . None  Tobacco Use  . Smoking status: Former Smoker    Last attempt to quit: 04/12/2009    Years since quitting: 8.6  . Smokeless tobacco: Never Used  Substance and Sexual Activity  . Alcohol use: Yes    Alcohol/week: 0.6 oz    Types: 1 Glasses of wine per week  . Drug use: No  . Sexual activity: None  Other Topics  Concern  . None  Social History Narrative   Occupation: 40 per week 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  [employer]  In stress position    Married now separated  2018    Regular exercise- yes  yoga   HH of 1-  girls home from college   Pet dogs passed away 2017   G2P2    Outpatient Medications Prior to Visit  Medication Sig Dispense Refill  . amphetamine-dextroamphetamine (ADDERALL XR) 20 MG 24 hr capsule Take 2 capsules (40 mg total) by mouth daily. 60 capsule 0  . amphetamine-dextroamphetamine (ADDERALL XR) 20 MG 24 hr capsule Take 2 capsules (40 mg total) by mouth daily. 60 capsule 0  . amphetamine-dextroamphetamine (ADDERALL XR) 20 MG 24 hr capsule Take 2 capsules (40 mg total) daily by mouth. 60 capsule 0  . atorvastatin (LIPITOR) 80 MG tablet TAKE 0.5 TABLETS (40 MG TOTAL) BY MOUTH DAILY. 45 tablet 1  . FLUoxetine (PROZAC) 20 MG capsule Take 2 capsules (40 mg total) by mouth daily. 60 capsule 3  . ranitidine (ZANTAC) 150 MG tablet TAKE 1 TABLET BY MOUTH TWICE A  DAY 60 tablet 5  . LORazepam (ATIVAN) 0.5 MG tablet Take 1-2 tablets (0.5-1 mg total) by mouth 2 (two) times daily as needed for anxiety. (Patient not taking: Reported on 11/29/2017) 24 tablet 0   No facility-administered medications prior to visit.      EXAM:  BP 124/80 (BP Location: Right Arm, Patient Position: Sitting, Cuff Size: Normal)   Pulse 97   Temp 98.1 F (36.7 C) (Oral)   Wt 199 lb 1.6 oz (90.3 kg)   BMI 31.75 kg/m   Body mass index is 31.75 kg/m.  GENERAL: vitals reviewed and listed above, alert, oriented, appears well hydrated and in no acute distress excessive motor  acitivy but less stressed than last visit  HEENT: atraumatic, conjunctiva  clear, no obvious abnormalities on inspection of external nose and ears  NECK: no obvious masses on inspection palpation  LUNGS: clear to auscultation bilaterally, no wheezes, rales or rhonchi, good air movement CV: HRRR, no clubbing cyanosis or  peripheral edema nl cap refill  MS:  moves all extremities in a boot  y PSYCH: pleasant and cooperative,  Mild anxiety Lab Results  Component Value Date   WBC 4.3 11/09/2016   HGB 12.6 11/09/2016   HCT 37.1 11/09/2016   PLT 256.0 11/09/2016   GLUCOSE 112 (H) 11/09/2016   CHOL 188 11/09/2016   TRIG 97.0 11/09/2016   HDL 50.30 11/09/2016   LDLDIRECT 88.6 11/01/2014   LDLCALC 118 (H) 11/09/2016   ALT 22 11/09/2016   AST 18 11/09/2016   NA 141 11/09/2016   K 4.2 11/09/2016   CL 106 11/09/2016   CREATININE 0.97 11/09/2016   BUN 17 11/09/2016   CO2 26 11/09/2016   TSH 4.36 11/09/2016   HGBA1C 5.6 11/16/2016   BP Readings from Last 3 Encounters:  11/29/17 124/80  08/21/17 130/80  07/18/17 (!) 130/100    ASSESSMENT AND PLAN:  Discussed the following assessment and plan:  Attention deficit hyperactivity disorder (ADHD), combined type - less effect on different  generic?   ask pharmacy to get back to "blu pill" and then let us know  - Plan: Comprehensive metabolic panel, Hemoglobin A1c, Lipid panel, CBC with Differential/Platelet, TSH  Medication management - Plan: Comprehensive metabolic panel, Hemoglobin A1c, Lipid panel, CBC with Differential/Platelet, TSH  Hyperlipidemia, unspecified hyperlipidemia type - due for labs  - Plan: Comprehensive metabolic panel, Hemoglobin A1c, Lipid panel, CBC with Differential/Platelet, TSH  Acute reaction to stress - improved cont counseling and meds  - Plan: Comprehensive metabolic panel, Hemoglobin A1c, Lipid panel, CBC with Differential/Platelet, TSH  HYPERGLYCEMIA, FASTING - Plan: Comprehensive metabolic panel, Hemoglobin A1c, Lipid panel, CBC with Differential/Platelet, TSH  -Patient advised to return or notify health care team  if  new concerns arise.  Patient Instructions  Get back on "blue pill"  For adderall  Different generics   Can  Have different efficacy.   After changing back let us know if helping and call for refill .   Get fasting lab  Appt. when you can come   .   [place] to [place] [address] . To get lipid   Etc.    Get us the results on tox screen.      Neta MendsWanda K. Panosh M.D.

## 2017-11-29 ENCOUNTER — Ambulatory Visit (INDEPENDENT_AMBULATORY_CARE_PROVIDER_SITE_OTHER): Payer: BLUE CROSS/BLUE SHIELD | Admitting: Internal Medicine

## 2017-11-29 ENCOUNTER — Encounter: Payer: Self-pay | Admitting: Internal Medicine

## 2017-11-29 VITALS — BP 124/80 | HR 97 | Temp 98.1°F | Wt 199.1 lb

## 2017-11-29 DIAGNOSIS — F430 Acute stress reaction: Secondary | ICD-10-CM

## 2017-11-29 DIAGNOSIS — R7309 Other abnormal glucose: Secondary | ICD-10-CM

## 2017-11-29 DIAGNOSIS — F902 Attention-deficit hyperactivity disorder, combined type: Secondary | ICD-10-CM

## 2017-11-29 DIAGNOSIS — Z79899 Other long term (current) drug therapy: Secondary | ICD-10-CM

## 2017-11-29 DIAGNOSIS — E785 Hyperlipidemia, unspecified: Secondary | ICD-10-CM

## 2017-11-29 NOTE — Patient Instructions (Addendum)
Get back on "blue pill"  For adderall  Different generics   Can  Have different efficacy.   After changing back let us know if helping and call for refill .   Get fasting lab  Appt. when you can come  .   [place] [address] . To get lipid   Etc.    Get us the results on tox screen.

## 2017-11-30 ENCOUNTER — Other Ambulatory Visit: Payer: Self-pay | Admitting: Internal Medicine

## 2017-12-03 DIAGNOSIS — M25561 Pain in right knee: Secondary | ICD-10-CM | POA: Diagnosis not present

## 2017-12-03 DIAGNOSIS — F509 Eating disorder, unspecified: Secondary | ICD-10-CM | POA: Diagnosis not present

## 2017-12-04 NOTE — Telephone Encounter (Signed)
Pt seen 11/29/17 Last refilled 07/18/17, #60 x 3 refills.  Please advise Dr Fabian SharpPanosh, thanks.

## 2017-12-17 DIAGNOSIS — M25561 Pain in right knee: Secondary | ICD-10-CM | POA: Diagnosis not present

## 2017-12-19 DIAGNOSIS — G4733 Obstructive sleep apnea (adult) (pediatric): Secondary | ICD-10-CM | POA: Diagnosis not present

## 2017-12-20 ENCOUNTER — Ambulatory Visit: Payer: BLUE CROSS/BLUE SHIELD | Admitting: Adult Health

## 2017-12-20 DIAGNOSIS — F509 Eating disorder, unspecified: Secondary | ICD-10-CM | POA: Diagnosis not present

## 2017-12-21 DIAGNOSIS — M25561 Pain in right knee: Secondary | ICD-10-CM | POA: Diagnosis not present

## 2017-12-27 DIAGNOSIS — S83241A Other tear of medial meniscus, current injury, right knee, initial encounter: Secondary | ICD-10-CM | POA: Diagnosis not present

## 2017-12-30 ENCOUNTER — Other Ambulatory Visit (INDEPENDENT_AMBULATORY_CARE_PROVIDER_SITE_OTHER): Payer: BLUE CROSS/BLUE SHIELD

## 2017-12-30 DIAGNOSIS — R7309 Other abnormal glucose: Secondary | ICD-10-CM

## 2017-12-30 DIAGNOSIS — Z79899 Other long term (current) drug therapy: Secondary | ICD-10-CM | POA: Diagnosis not present

## 2017-12-30 DIAGNOSIS — F430 Acute stress reaction: Secondary | ICD-10-CM | POA: Diagnosis not present

## 2017-12-30 DIAGNOSIS — E785 Hyperlipidemia, unspecified: Secondary | ICD-10-CM

## 2017-12-30 DIAGNOSIS — F902 Attention-deficit hyperactivity disorder, combined type: Secondary | ICD-10-CM | POA: Diagnosis not present

## 2017-12-30 LAB — COMPREHENSIVE METABOLIC PANEL
ALT: 28 U/L (ref 0–35)
AST: 20 U/L (ref 0–37)
Albumin: 4.1 g/dL (ref 3.5–5.2)
Alkaline Phosphatase: 54 U/L (ref 39–117)
BUN: 12 mg/dL (ref 6–23)
CO2: 26 mEq/L (ref 19–32)
Calcium: 8.7 mg/dL (ref 8.4–10.5)
Chloride: 109 mEq/L (ref 96–112)
Creatinine, Ser: 0.86 mg/dL (ref 0.40–1.20)
GFR: 73.16 mL/min (ref >60.00)
Glucose, Bld: 116 mg/dL — ABNORMAL HIGH (ref 70–99)
Potassium: 4.4 mEq/L (ref 3.5–5.1)
Sodium: 141 mEq/L (ref 135–145)
Total Bilirubin: 0.4 mg/dL (ref 0.2–1.2)
Total Protein: 6.2 g/dL (ref 6.0–8.3)

## 2017-12-30 LAB — CBC WITH DIFFERENTIAL/PLATELET
Basophils Absolute: 0 10*3/uL (ref 0.0–0.1)
Basophils Relative: 0.5 % (ref 0.0–3.0)
Eosinophils Absolute: 0.3 10*3/uL (ref 0.0–0.7)
Eosinophils Relative: 5.5 % — ABNORMAL HIGH (ref 0.0–5.0)
HCT: 39.9 % (ref 36.0–46.0)
Hemoglobin: 13.2 g/dL (ref 12.0–15.0)
Lymphocytes Relative: 22.9 % (ref 12.0–46.0)
Lymphs Abs: 1.3 10*3/uL (ref 0.7–4.0)
MCHC: 33.2 g/dL (ref 30.0–36.0)
MCV: 93 fl (ref 78.0–100.0)
Monocytes Absolute: 0.4 10*3/uL (ref 0.1–1.0)
Monocytes Relative: 6.8 % (ref 3.0–12.0)
Neutro Abs: 3.6 10*3/uL (ref 1.4–7.7)
Neutrophils Relative %: 64.3 % (ref 43.0–77.0)
Platelets: 251 10*3/uL (ref 150.0–400.0)
RBC: 4.29 Mil/uL (ref 3.87–5.11)
RDW: 13.6 % (ref 11.5–15.5)
WBC: 5.6 10*3/uL (ref 4.0–10.5)

## 2017-12-30 LAB — LIPID PANEL
Cholesterol: 145 mg/dL (ref 0–200)
HDL: 50.2 mg/dL (ref >39.00)
LDL Cholesterol: 85 mg/dL (ref 0–99)
NonHDL: 95.21
Total CHOL/HDL Ratio: 3
Triglycerides: 52 mg/dL (ref 0.0–149.0)
VLDL: 10.4 mg/dL (ref 0.0–40.0)

## 2017-12-30 LAB — HEMOGLOBIN A1C: Hgb A1c MFr Bld: 5.8 % (ref 4.6–6.5)

## 2017-12-30 LAB — TSH: TSH: 5.36 u[IU]/mL — ABNORMAL HIGH (ref 0.35–4.50)

## 2018-01-02 ENCOUNTER — Other Ambulatory Visit: Payer: Self-pay | Admitting: Internal Medicine

## 2018-01-02 DIAGNOSIS — R7989 Other specified abnormal findings of blood chemistry: Secondary | ICD-10-CM

## 2018-01-02 NOTE — Progress Notes (Signed)
Labs ordered.

## 2018-01-07 ENCOUNTER — Other Ambulatory Visit (INDEPENDENT_AMBULATORY_CARE_PROVIDER_SITE_OTHER): Payer: BLUE CROSS/BLUE SHIELD

## 2018-01-07 DIAGNOSIS — R7989 Other specified abnormal findings of blood chemistry: Secondary | ICD-10-CM | POA: Diagnosis not present

## 2018-01-07 LAB — T4, FREE: Free T4: 0.69 ng/dL (ref 0.60–1.60)

## 2018-01-07 LAB — TSH: TSH: 6.18 u[IU]/mL — ABNORMAL HIGH (ref 0.35–4.50)

## 2018-01-08 LAB — THYROID PEROXIDASE ANTIBODY: Thyroperoxidase Ab SerPl-aCnc: <1 IU/mL (ref <9)

## 2018-01-09 ENCOUNTER — Telehealth: Payer: Self-pay | Admitting: Family Medicine

## 2018-01-09 ENCOUNTER — Encounter: Payer: Self-pay | Admitting: Internal Medicine

## 2018-01-09 ENCOUNTER — Telehealth: Payer: Self-pay | Admitting: Internal Medicine

## 2018-01-09 NOTE — Telephone Encounter (Signed)
Copied from CRM [phone]. Topic: Inquiry >> Jan 09, 2018  1:45 PM Terisa Starraylor, Brittany L wrote: Patient wants to know if Dr Fabian SharpPanosh received the tox screening report from Dr Harriet PhoAjlouny.

## 2018-01-09 NOTE — Telephone Encounter (Signed)
Copied from CRM [phone]. Topic: Quick Communication - See Telephone Encounter >> Jan 09, 2018  1:43 PM Terisa Starraylor, Brittany L wrote: CRM for notification. See Telephone encounter for:   01/09/18.  amphetamine-dextroamphetamine (ADDERALL XR) 20 MG 24 hr capsule  Call when ready for pickup [phone]  3 month supply

## 2018-01-10 ENCOUNTER — Encounter: Payer: Self-pay | Admitting: Internal Medicine

## 2018-01-10 MED ORDER — AMPHETAMINE-DEXTROAMPHET ER 20 MG PO CP24: ORAL_CAPSULE | ORAL | 0 refills | Status: DC

## 2018-01-10 MED ORDER — AMPHETAMINE-DEXTROAMPHET ER 20 MG PO CP24: 40.0000 mg | ORAL_CAPSULE | Freq: Every day | ORAL | 0 refills | Status: DC

## 2018-01-10 NOTE — Telephone Encounter (Signed)
I send it  2 months  rx   To pharmacy see  Other note message

## 2018-01-10 NOTE — Telephone Encounter (Signed)
   Need an updated mediation list and sig   Of  otcs before    We can order otc  this  In .   (Her surgeon can also do this in the future as advised )_   I  Sent  in  Refill of adderall electronically  2 .

## 2018-01-10 NOTE — Telephone Encounter (Signed)
Last filled 07/10/17, #60 x 1 RX and 11/14/17, #60 x 2 RX Pt is requesting a new 3 mo Rx Please advise Dr Fabian SharpPanosh, thanks.

## 2018-01-10 NOTE — Telephone Encounter (Signed)
Please advise Dr Fabian SharpPanosh if you are okay with writing Rx's for the OTC meds she needs for insurance purposes.  Please also see refill request for Adderall in your telephone in basket. Thanks.

## 2018-01-13 ENCOUNTER — Ambulatory Visit: Payer: BLUE CROSS/BLUE SHIELD | Admitting: Adult Health

## 2018-01-13 NOTE — Telephone Encounter (Signed)
See mychart encounter 01/10/18 Duplicate messages.  Pt made aware via mychart.

## 2018-01-13 NOTE — Telephone Encounter (Signed)
msg sent back to patient.  Will wait for her to contact back regarding writing Rxs for all OTC meds.

## 2018-01-14 NOTE — Telephone Encounter (Signed)
I dont see this  In my box  Or under labs   Please advise  Where to find the results

## 2018-01-14 NOTE — Telephone Encounter (Signed)
It is in your paper results for normal records review which is stored in the cubby on my desk.

## 2018-01-14 NOTE — Telephone Encounter (Signed)
Pt aware that we did receive her Tox Screen results- these are in results review for Dr Fabian SharpPanosh.  Will send as FYI. Nothing further needed.

## 2018-01-15 NOTE — Telephone Encounter (Signed)
Dr Fabian SharpPanosh please advise If you can [facility] the [facility] [place], on [address] -- med is pending if okay to send.  [facility] [address] [facility] not [facility] the [facility] that [facility] needs but [facility] [place] [facility]. Pt states [facility] [address] is cancelling [facility] Rx since we will be sending to a different pharmacy.

## 2018-01-15 NOTE — Telephone Encounter (Signed)
Patient calling checking status, please advise.

## 2018-01-15 NOTE — Telephone Encounter (Signed)
Yes I received and thanks   Liver tests were slightlhy off.   Will  Fu at her next rov  In ? March?

## 2018-01-16 MED ORDER — AMPHETAMINE-DEXTROAMPHET ER 20 MG PO CP24: 40.0000 mg | ORAL_CAPSULE | Freq: Every day | ORAL | 0 refills | Status: DC

## 2018-01-16 MED ORDER — AMPHETAMINE-DEXTROAMPHET ER 20 MG PO CP24: ORAL_CAPSULE | ORAL | 0 refills | Status: DC

## 2018-01-16 NOTE — Telephone Encounter (Signed)
Sent in  Please make sure  You are getign the correct  Generic when you pick it up

## 2018-02-05 DIAGNOSIS — M23221 Derangement of posterior horn of medial meniscus due to old tear or injury, right knee: Secondary | ICD-10-CM | POA: Diagnosis not present

## 2018-02-05 DIAGNOSIS — M94261 Chondromalacia, right knee: Secondary | ICD-10-CM | POA: Diagnosis not present

## 2018-02-05 DIAGNOSIS — M6751 Plica syndrome, right knee: Secondary | ICD-10-CM | POA: Diagnosis not present

## 2018-02-05 DIAGNOSIS — G8918 Other acute postprocedural pain: Secondary | ICD-10-CM | POA: Diagnosis not present

## 2018-02-07 MED ORDER — CALCIUM 500 MG PO CHEW: CHEWABLE_TABLET | ORAL | 6 refills | Status: AC

## 2018-02-07 MED ORDER — CVS WOMENS DAILY GUMMIES PO CHEW: 2.0000 [IU] | CHEWABLE_TABLET | Freq: Every day | ORAL | 6 refills | Status: AC

## 2018-02-07 MED ORDER — ACETAMINOPHEN 500 MG PO TABS: 1000.0000 mg | ORAL_TABLET | Freq: Four times a day (QID) | ORAL | 6 refills | Status: DC | PRN

## 2018-02-07 NOTE — Addendum Note (Signed)
Addended by: Maisie FusGREEN, ASHTYN M on: 02/07/2018 10:00 AM   Modules accepted: Orders

## 2018-02-13 DIAGNOSIS — Z4789 Encounter for other orthopedic aftercare: Secondary | ICD-10-CM | POA: Diagnosis not present

## 2018-02-13 DIAGNOSIS — M25561 Pain in right knee: Secondary | ICD-10-CM | POA: Diagnosis not present

## 2018-02-17 ENCOUNTER — Other Ambulatory Visit: Payer: Self-pay | Admitting: Internal Medicine

## 2018-02-17 DIAGNOSIS — Z4789 Encounter for other orthopedic aftercare: Secondary | ICD-10-CM | POA: Diagnosis not present

## 2018-02-17 DIAGNOSIS — M25561 Pain in right knee: Secondary | ICD-10-CM | POA: Diagnosis not present

## 2018-02-20 ENCOUNTER — Ambulatory Visit: Payer: Self-pay | Admitting: *Deleted

## 2018-02-20 NOTE — Telephone Encounter (Signed)
Pt  Feels  Anxious  Depressed  Having  Problems  Sleeping  Many  Stressors  With family  Work and  Recent knee  Surgery . Denies  Any  Suicidal  Ideations  Or harming herself  Or others . Dr Fabian Sharp has  No  Availability  At this  Time  Spoke  wth  Nettie Elm  Who is  Aware  And  The  Staff is  Working  To  Get  Her an  appropriate  Appointment  And will call patient back . Patient  Advised. Patient call  Back number is  [phone] . Pt also  Advised  If  Symptoms  Worse in the  Meantime  To call  Back   Reason for Disposition . [1] Depression AND [2] worsening (e.g.,sleeping poorly, less able to do activities of daily living)  Answer Assessment - Initial Assessment Questions 1. CONCERN: "What happened that made you call today?"       Yesterday   Had  Words  Over  Phone  With  family  And   excerbated   Stressors  2. DEPRESSION SYMPTOM SCREENING: "How are you feeling overall?" (e.g., decreased energy, increased sleeping or difficulty sleeping, difficulty concentrating, feelings of sadness, guilt, hopelessness, or worthlessness)  Difficulty  concentrating   Sad  Hopeless    Difficulty   At  Work  With    Tasks    tearfull   At  This  Time        *No Answer* 3. RISK OF HARM - SUICIDAL IDEATION:  "Do you ever have thoughts of hurting or killing yourself?"  (e.g., yes, no, no but preoccupation with thoughts about death)   - INTENT:  "Do you have thoughts of hurting or killing yourself right NOW?" (e.g., yes, no, N/A)   - PLAN: "Do you have a specific plan for how you would do this?" (e.g., gun, knife, overdose, no plan, N/A)     No  4. RISK OF HARM - HOMICIDAL IDEATION:  "Do you ever have thoughts of hurting or killing someone else?"  (e.g., yes, no, no but preoccupation with thoughts about death)   - INTENT:  "Do you have thoughts of hurting or killing someone right NOW?" (e.g., yes, no, N/A)   - PLAN: "Do you have a specific plan for how you would do this?" (e.g., gun, knife, no plan, N/A)      No  5.  FUNCTIONAL IMPAIRMENT: "How have things been going for you overall in your life? Have you had any more difficulties than usual doing your normal daily activities?"  (e.g., better, same, worse; self-care, school, work, interactions)      More  Difficulties  Recently    Has  Been  Difficulty  Over  Last  6 months   Having problems   Coping  With  Activities  Of  Daily  Living   Pt  Lives alone    Feels  Lonely    Knee   Surgery Feb  6   Pt is  On crutches    6. SUPPORT: "Who is with you now?" "Who do you live with?" "Do you have family or friends nearby who you can talk to?"        Live   Support  With  [employer]  And  Texts with   Sister    7. THERAPIST: "Do you have a counselor or therapist? Name?"       Was  Seeing  Dr Mercy MooreLury  Stopped  In  Dec  -    8. STRESSORS: "Has there been any new stress or recent changes in your life?"      Many     9. DRUG ABUSE/ALCOHOL: "Do you drink alcohol or use any illegal drugs?"        Occasonally   Drinks     10. OTHER: "Do you have any other health or medical symptoms right now?" (e.g., fever)        IBS -  Flair up  At  Times    11. PREGNANCY: "Is there any chance you are pregnant?" "When was your last menstrual period?"          N/a  Protocols used: DEPRESSION-A-AH

## 2018-02-20 NOTE — Telephone Encounter (Signed)
Waiting on response from Dr. Fabian SharpPanosh, then will schedule patient.

## 2018-02-20 NOTE — Telephone Encounter (Signed)
Per Dr. Fabian SharpPanosh okay to schedule for 02/21/2018 Friday at 1:45 pm. I spoke with pt to confirm this appointment.

## 2018-02-21 ENCOUNTER — Ambulatory Visit (INDEPENDENT_AMBULATORY_CARE_PROVIDER_SITE_OTHER): Payer: BLUE CROSS/BLUE SHIELD | Admitting: Internal Medicine

## 2018-02-21 ENCOUNTER — Encounter: Payer: Self-pay | Admitting: Internal Medicine

## 2018-02-21 VITALS — BP 128/90 | HR 98 | Temp 98.3°F | Wt 193.7 lb

## 2018-02-21 DIAGNOSIS — R7989 Other specified abnormal findings of blood chemistry: Secondary | ICD-10-CM | POA: Diagnosis not present

## 2018-02-21 DIAGNOSIS — M25461 Effusion, right knee: Secondary | ICD-10-CM | POA: Diagnosis not present

## 2018-02-21 DIAGNOSIS — M25561 Pain in right knee: Secondary | ICD-10-CM | POA: Diagnosis not present

## 2018-02-21 DIAGNOSIS — F902 Attention-deficit hyperactivity disorder, combined type: Secondary | ICD-10-CM

## 2018-02-21 DIAGNOSIS — F4323 Adjustment disorder with mixed anxiety and depressed mood: Secondary | ICD-10-CM

## 2018-02-21 DIAGNOSIS — Z79899 Other long term (current) drug therapy: Secondary | ICD-10-CM

## 2018-02-21 DIAGNOSIS — Z4789 Encounter for other orthopedic aftercare: Secondary | ICD-10-CM | POA: Diagnosis not present

## 2018-02-21 MED ORDER — BUPROPION HCL ER (XL) 150 MG PO TB24: 150.0000 mg | ORAL_TABLET | Freq: Every day | ORAL | 1 refills | Status: DC

## 2018-02-21 MED ORDER — AMPHETAMINE-DEXTROAMPHET ER 20 MG PO CP24: 40.0000 mg | ORAL_CAPSULE | Freq: Every day | ORAL | 0 refills | Status: DC

## 2018-02-21 NOTE — Patient Instructions (Addendum)
Support   Options.     As we discussed and  Help    Reality check  With people you trust.   Get back with  a counselor .  as we discussed .Marland Kitchen.  We can add on   Wellbutrin for now and see how does.

## 2018-02-21 NOTE — Progress Notes (Signed)
Chief Complaint  Patient presents with  . 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 come in for   Acute .   Mood depression.   Called   Hard time coping   Struggling and  No  felt support  And not when called. .     July husband filed for separation with another women  Had   [facility]  Surgery  Recovering  PT . Told boss and  Didn't help her much . Cant make decisions. Feels stuck .  And ot supportied  From family.  No other  family here and then had knee surgery feb 6th  And left 12 and then went down hill.  Felt the lsos and not trusting.     31  Divorce group kind of helped. /  Feels   Pained and  Lost and  Not worth while recently    daughter  And  neighbor were concerned and daughter  Reports to me before visit of concerns . Patient says daughter has been somewhat  Angry with her at times and  Thus  Is another piece of the stress .   Feels like shutting out at times and isolating ( works at home)  Also refill adderall  Printed  To be able to get the correct generic maker .   Money an issues but now deductible  So will go back to counseling  No plans   But very distressed   [address]ass [address] rd did star back to tobacco  ROS: See pertinent positives and negatives per HPI.  Past Medical History:  Diagnosis Date  . Allergic rhinitis   . Depression   . Hx of abnormal cervical Pap smear    used cryo when first married and pregnant  . Hyperglycemia   . Hyperlipidemia   . Sinusitis   . UTI (lower urinary tract infection)     Family History  Problem Relation Age of Onset  . Atrial fibrillation Mother   . Heart attack Brother        age 54  . Diabetes type II Unknown   . Hyperlipidemia Unknown   . Factor V Leiden deficiency Daughter        Also husband    Social History   Socioeconomic History  . Marital status: Legally Separated    Spouse name: None  . Number of children: None  . Years of education: None  . Highest education level: None  Social Needs  . Financial resource  strain: None  . Food insecurity - worry: None  . Food insecurity - inability: None  . Transportation needs - medical: None  . Transportation needs - non-medical: None  Occupational History  . None  Tobacco Use  . Smoking status: Former Smoker    Last attempt to quit: 04/12/2009    Years since quitting: 8.8  . Smokeless tobacco: Never Used  Substance and Sexual Activity  . Alcohol use: Yes    Alcohol/week: 0.6 oz    Types: 1 Glasses of wine per week  . Drug use: No  . Sexual activity: None  Other Topics Concern  . None  Social History Narrative   Occupation: 40 per week 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  [employer]  In stress position    Married now separated  2018    Regular exercise- yes  yoga   HH of 1-  girls home from college   Pet dogs passed away 2017   G2P2    Outpatient  Medications Prior to Visit  Medication Sig Dispense Refill  . Acetaminophen (TYLENOL PO) Take 1,000 mg by mouth as needed for pain.    Marland Kitchen acetaminophen (TYLENOL) 500 MG tablet Take 2 tablets (1,000 mg total) by mouth every 6 (six) hours as needed for mild pain or moderate pain. 60 tablet 6  . amphetamine-dextroamphetamine (ADDERALL XR) 20 MG 24 hr capsule Take 2 capsules (40 mg total) by mouth daily. 60 capsule 0  . amphetamine-dextroamphetamine (ADDERALL XR) 20 MG 24 hr capsule Take 2 capsules (40 mg total) by mouth daily 60 capsule 0  . atorvastatin (LIPITOR) 80 MG tablet TAKE 0.5 TABLETS (40 MG TOTAL) BY MOUTH DAILY. 45 tablet 1  . Calcium 500 MG CHEW 1 chewable tablet daily 30 tablet 6  . FLUoxetine (PROZAC) 20 MG capsule TAKE 2 CAPSULES BY MOUTH EVERY DAY 60 capsule 3  . Multiple Vitamins-Minerals ([facility] WOMENS DAILY GUMMIES) CHEW Chew 2 Units by mouth daily. 60 tablet 6  . Multiple Vitamins-Minerals (MULTIPLE VITAMINS/WOMENS PO) Chew 2 gummys daily    . ranitidine (ZANTAC) 150 MG tablet TAKE 1 TABLET BY MOUTH TWICE A DAY 60 tablet 5  . amphetamine-dextroamphetamine (ADDERALL XR) 20 MG 24 hr capsule Take 2 capsules (40 mg  total) by mouth daily. [employer] 60 capsule 0   No facility-administered medications prior to visit.      EXAM:  BP 128/90 (BP Location: Right Arm, Patient Position: Sitting, Cuff Size: Normal)   Pulse 98   Temp 98.3 F (36.8 C) (Oral)   Wt 193 lb 11.2 oz (87.9 kg)   BMI 30.89 kg/m   Body mass index is 30.89 kg/m.  GENERAL: vitals reviewed and listed above, alert, oriented, appears well hydrated and in  emotional  Through out howeer nl though and speech with insight  Has cane     BP Readings from Last 3 Encounters:  02/21/18 128/90  11/29/17 124/80  08/21/17 130/80    ASSESSMENT AND PLAN:  Discussed the following assessment and plan:  Adjustment reaction with anxiety and depression - worsening   separated after 32 years of marriage and job stress   Attention deficit hyperactivity disorder (ADHD), combined type  Medication management  Elevated TSH Significant  situational depression anxiety   And adhd   prob worse after sis left and  Job family issues  Noted  Disc  Supports and medication adjustment and getting back to counseling is immediately appropriate.  No firearms at home and not planning but does feel isolated.    Continue fluoxetine 40  Add Wellbutrin consider seeing psych  Or changing medication in to 60 fluoxetine  After patient left reviewed   tsh elevation  consideration of adding med if  persistent or progressive  tpo neg free t4 nl   Total visit 40 mins > 50% spent counseling and coordinating care as indicated in above note and in instructions to patient .  Has fu appt   March  Keep and let us know about med in interim  -Patient advised to return or notify health care team  if  new concerns arise.  Patient Instructions  Support   Options.     As we discussed and  Help    Reality check  With people you trust.   Get back with  a counselor .  as we discussed .Marland Kitchen  We can add on   Wellbutrin for now and see how does.       Neta Mends. Panosh M.D.

## 2018-02-24 ENCOUNTER — Ambulatory Visit: Payer: BLUE CROSS/BLUE SHIELD | Admitting: Internal Medicine

## 2018-02-24 DIAGNOSIS — M25561 Pain in right knee: Secondary | ICD-10-CM | POA: Diagnosis not present

## 2018-02-24 DIAGNOSIS — Z4789 Encounter for other orthopedic aftercare: Secondary | ICD-10-CM | POA: Diagnosis not present

## 2018-02-27 DIAGNOSIS — Z4789 Encounter for other orthopedic aftercare: Secondary | ICD-10-CM | POA: Diagnosis not present

## 2018-02-27 DIAGNOSIS — M25561 Pain in right knee: Secondary | ICD-10-CM | POA: Diagnosis not present

## 2018-02-28 DIAGNOSIS — F509 Eating disorder, unspecified: Secondary | ICD-10-CM | POA: Diagnosis not present

## 2018-03-03 ENCOUNTER — Ambulatory Visit: Payer: BLUE CROSS/BLUE SHIELD | Admitting: Adult Health

## 2018-03-04 DIAGNOSIS — Z4789 Encounter for other orthopedic aftercare: Secondary | ICD-10-CM | POA: Diagnosis not present

## 2018-03-04 DIAGNOSIS — M25561 Pain in right knee: Secondary | ICD-10-CM | POA: Diagnosis not present

## 2018-03-06 DIAGNOSIS — Z4789 Encounter for other orthopedic aftercare: Secondary | ICD-10-CM | POA: Diagnosis not present

## 2018-03-06 DIAGNOSIS — M25561 Pain in right knee: Secondary | ICD-10-CM | POA: Diagnosis not present

## 2018-03-11 DIAGNOSIS — F509 Eating disorder, unspecified: Secondary | ICD-10-CM | POA: Diagnosis not present

## 2018-03-12 ENCOUNTER — Encounter: Payer: Self-pay | Admitting: Internal Medicine

## 2018-03-13 NOTE — Telephone Encounter (Signed)
Dr. Panosh please advise. ° °Reply to Ashtyn. °

## 2018-03-17 NOTE — Telephone Encounter (Signed)
How about [facility]    [phone]    They have  More pre scribers   And may be able to get in earlier . :et us know if you can get appt and then

## 2018-03-18 DIAGNOSIS — F509 Eating disorder, unspecified: Secondary | ICD-10-CM | POA: Diagnosis not present

## 2018-03-21 NOTE — Telephone Encounter (Signed)
Note sent back to patient,   Dr Fabian SharpPanosh is wanting to move your 03/28/18 appt up to 03/26/18.  She said that she can see you on that day to allow more time to fill out the paperwork since it is due on 03/31/18.  Please call our office and request the appt at 12:30pm on 03/26/18.  Spot held on schedule 03/26/18

## 2018-03-21 NOTE — Telephone Encounter (Signed)
Form is in your red folder if agreeable to sign  Please advise. Thanks.

## 2018-03-21 NOTE — Telephone Encounter (Signed)
Need  more info dates of being out    But may be best  to make appt for next week and we can try to weed through what is needed

## 2018-03-24 NOTE — Progress Notes (Signed)
Chief Complaint  Patient presents with  . Form Completion    Disability forms - last day of work 03/11/18. No days missed prior that need to be reported.  . Medication Management    Bupropion - doing well on 300mg  dose, been taking at his dose x 1 week. No noted side effects other than dry mouth. Pt states that she is still having increase anxiety. Depressed moods are about same on the 300mg  dose but she has not been taking the higher dose very long.    HPI: Victoria Carter 53 y.o. come in for   Med management  Of reactive anx depression adhd  And disabilities  form  Of depressive sx and meds   See last visit and my chart visit    Coping but out of work  Since 3 12 19  and seeing dr Cyndia Skeeters weekly for now    Hard to concentrate even with her adhd medication   Working on   Other adaptation  And has April 15 to get informatin also  to attorney .  Sadness and anger and frustration   .   Trying to get the necessary  Things done.  Couldn't get appt for psych for 5 weeks and asks about this.   Wellbutrin  Dry mouth poss some help at this time.    No cp sob but still gets oacss panic  Attacks  Out of lorazepam last Nell[place]    A few months ago  ROS: See pertinent positives and negatives per HPI.  Past Medical History:  Diagnosis Date  . Allergic rhinitis   . Depression   . Hx of abnormal cervical Pap smear    used cryo when first married and pregnant  . Hyperglycemia   . Hyperlipidemia   . Sinusitis   . UTI (lower urinary tract infection)     Family History  Problem Relation Age of Onset  . Atrial fibrillation Mother   . Heart attack Brother        age 8  . Diabetes type II Unknown   . Hyperlipidemia Unknown   . Factor V Leiden deficiency Daughter        Also husband    Social History   Socioeconomic History  . Marital status: Legally Separated    Spouse name: Not on file  . Number of children: Not on file  . Years of education: Not on file  . Highest education level: Not on file    Occupational History  . Not on file  Social Needs  . Financial resource strain: Not on file  . Food insecurity:    Worry: Not on file    Inability: Not on file  . Transportation needs:    Medical: Not on file    Non-medical: Not on file  Tobacco Use  . Smoking status: Former Smoker    Last attempt to quit: 04/12/2009    Years since quitting: 8.9  . Smokeless tobacco: Never Used  Substance and Sexual Activity  . Alcohol use: Yes    Alcohol/week: 0.6 oz    Types: 1 Glasses of wine per week  . Drug use: No  . Sexual activity: Not on file  Lifestyle  . Physical activity:    Days per week: Not on file    Minutes per session: Not on file  . Stress: Not on file  Relationships  . Social connections:    Talks on phone: Not on file    Gets together: Not on file  Attends religious service: Not on file    Active member of club or organization: Not on file    Attends meetings of clubs or organizations: Not on file    Relationship status: Not on file  Other Topics Concern  . Not on file  Social History Narrative   Occupation: 40 per week 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  [employer]  In stress position    Married now separated  2018    Regular exercise- yes  yoga   HH of 1-  girls home from college   Pet dogs passed away 2017   G2P2    Outpatient Medications Prior to Visit  Medication Sig Dispense Refill  . Acetaminophen (TYLENOL PO) Take 1,000 mg by mouth as needed for pain.    Marland Kitchen. acetaminophen (TYLENOL) 500 MG tablet Take 2 tablets (1,000 mg total) by mouth every 6 (six) hours as needed for mild pain or moderate pain. 60 tablet 6  . amphetamine-dextroamphetamine (ADDERALL XR) 20 MG 24 hr capsule Take 2 capsules (40 mg total) by mouth daily. 60 capsule 0  . amphetamine-dextroamphetamine (ADDERALL XR) 20 MG 24 hr capsule Take 2 capsules (40 mg total) by mouth daily 60 capsule 0  . amphetamine-dextroamphetamine (ADDERALL XR) 20 MG 24 hr capsule Take 2 capsules (40 mg total) by mouth daily. [employer] 60 capsule 0  . atorvastatin (LIPITOR) 80 MG tablet TAKE 0.5 TABLETS (40 MG TOTAL) BY MOUTH DAILY. 45 tablet 1  . Calcium 500 MG CHEW 1 chewable tablet daily 30 tablet 6  . FLUoxetine (PROZAC) 20 MG capsule TAKE 2 CAPSULES BY MOUTH EVERY DAY 60 capsule 3  . Multiple Vitamins-Minerals ([facility] WOMENS DAILY GUMMIES) CHEW Chew 2 Units by mouth daily. 60 tablet 6  . Multiple Vitamins-Minerals (MULTIPLE VITAMINS/WOMENS PO) Chew 2 gummys daily    . ranitidine (ZANTAC) 150 MG tablet TAKE 1 TABLET BY MOUTH TWICE A DAY 60 tablet 5  . buPROPion (WELLBUTRIN XL) 150 MG 24 hr tablet Take 1 tablet (150 mg total) by mouth daily. Increase to 300 mg per day after  3 weeks  . (Patient taking differently: Take 300 mg by mouth daily. ) 60 tablet 1   No facility-administered medications prior to visit.      EXAM:  BP 122/78 (BP Location: Right Arm, Patient Position: Sitting, Cuff Size: Normal)   Pulse 95   Temp 98.6 F (37 C) (Oral)   Wt 193 lb 8 oz (87.8 kg)   BMI 30.86 kg/m   Body mass index is 30.86 kg/m.  GENERAL: vitals reviewed and listed above, alert, oriented, appears well hydrated and in no acute distress  Excess motor activity emotional at times  But  Cognition intact  Some anxiety  Ok insight     Gait nl  HEENT: atraumatic, conjunctiva  clear, no obvious abnormalities on inspection of external nose and ears NECK: no obvious masses on inspection palpation  PSYCH: pleasant and cooperative, nl speech  Excess motor acitivity  But nl converstaion  Anxiety    ocass tearful and emotional  Lab Results  Component Value Date   WBC 5.6 12/30/2017   HGB 13.2 12/30/2017   HCT 39.9 12/30/2017   PLT 251.0 12/30/2017   GLUCOSE 116 (H) 12/30/2017   CHOL 145 12/30/2017   TRIG 52.0 12/30/2017   HDL 50.20 12/30/2017   LDLDIRECT 88.6 11/01/2014   LDLCALC 85 12/30/2017   ALT 28 12/30/2017   AST 20 12/30/2017   NA 141 12/30/2017   K 4.4 12/30/2017  CL 109 12/30/2017   CREATININE 0.86 12/30/2017    BUN 12 12/30/2017   CO2 26 12/30/2017   TSH 6.18 (H) 01/07/2018   HGBA1C 5.8 12/30/2017   BP Readings from Last 3 Encounters:  03/26/18 122/78  02/21/18 128/90  11/29/17 124/80   Wt Readings from Last 3 Encounters:  03/26/18 193 lb 8 oz (87.8 kg)  02/21/18 193 lb 11.2 oz (87.9 kg)  11/29/17 199 lb 1.6 oz (90.3 kg)    ASSESSMENT AND PLAN:  Discussed the following assessment and plan:  Adjustment reaction with anxiety and depression  Medication management  Attention deficit hyperactivity disorder (ADHD), combined type  Panic attack as reaction to stress  Elevated TSH - sub clinical readings needs fu  - Plan: TSH, T4, free, T3, free Disability form   to be finished and sent  Minor se of med at this t ime and stay on  The 300 mg per day  Continue  Wellbutrin  And fluoxetine and still advise  Psych prescribe consults    No other change in med at this time  But add on as needed lorazepam for panic if needed  ( last rx in summer 18)  -Patient advised to return or notify health care team  if  new concerns arise. In the interim  Elevated  tsh     Not discussed at visit will need to recheck this level     doubt contributing  At this time .  Total visit 30 mins > 50% spent counseling and coordinating care as indicated in above note and in instructions to patient .   Patient Instructions  Form to be sent in .  Make appt with   Psychiatry    About medication   Management .  Exercise getting out.  In daylight.  Continue   Seeing  psychologist .  Can use lorazepam as needed for panic   With caution    Plan ROV   in    4- 6 weeks  If not in with psych at that time.       Neta Mends. Panosh M.D.  Overlooked disc about needing repeat tsh   Plan to notify her  To get labs done when convenient beofre next visit.

## 2018-03-24 NOTE — Telephone Encounter (Signed)
Pt scheduled for visit with Dr Fabian SharpPanosh on 03/26/18

## 2018-03-25 ENCOUNTER — Ambulatory Visit: Payer: BLUE CROSS/BLUE SHIELD | Admitting: Adult Health

## 2018-03-25 DIAGNOSIS — F509 Eating disorder, unspecified: Secondary | ICD-10-CM | POA: Diagnosis not present

## 2018-03-26 ENCOUNTER — Encounter: Payer: Self-pay | Admitting: Internal Medicine

## 2018-03-26 ENCOUNTER — Ambulatory Visit (INDEPENDENT_AMBULATORY_CARE_PROVIDER_SITE_OTHER): Payer: BLUE CROSS/BLUE SHIELD | Admitting: Internal Medicine

## 2018-03-26 VITALS — BP 122/78 | HR 95 | Temp 98.6°F | Wt 193.5 lb

## 2018-03-26 DIAGNOSIS — R7989 Other specified abnormal findings of blood chemistry: Secondary | ICD-10-CM

## 2018-03-26 DIAGNOSIS — F430 Acute stress reaction: Secondary | ICD-10-CM

## 2018-03-26 DIAGNOSIS — F902 Attention-deficit hyperactivity disorder, combined type: Secondary | ICD-10-CM | POA: Diagnosis not present

## 2018-03-26 DIAGNOSIS — F410 Panic disorder [episodic paroxysmal anxiety] without agoraphobia: Secondary | ICD-10-CM | POA: Diagnosis not present

## 2018-03-26 DIAGNOSIS — Z79899 Other long term (current) drug therapy: Secondary | ICD-10-CM | POA: Diagnosis not present

## 2018-03-26 DIAGNOSIS — F4323 Adjustment disorder with mixed anxiety and depressed mood: Secondary | ICD-10-CM

## 2018-03-26 MED ORDER — LORAZEPAM 0.5 MG PO TABS: 0.5000 mg | ORAL_TABLET | Freq: Two times a day (BID) | ORAL | 0 refills | Status: DC | PRN

## 2018-03-26 MED ORDER — BUPROPION HCL ER (XL) 150 MG PO TB24: 300.0000 mg | ORAL_TABLET | Freq: Every day | ORAL | 0 refills | Status: DC

## 2018-03-26 NOTE — Patient Instructions (Addendum)
Form to be sent in .  Make appt with   Psychiatry    About medication   Management .  Exercise getting out.  In daylight.  Continue   Seeing  psychologist .  Can use lorazepam as needed for panic   With caution    Plan ROV   in    4- 6 weeks  If not in with psych at that time.

## 2018-03-27 ENCOUNTER — Encounter: Payer: Self-pay | Admitting: Internal Medicine

## 2018-03-28 ENCOUNTER — Ambulatory Visit: Payer: BLUE CROSS/BLUE SHIELD | Admitting: Internal Medicine

## 2018-04-01 DIAGNOSIS — F509 Eating disorder, unspecified: Secondary | ICD-10-CM | POA: Diagnosis not present

## 2018-04-06 ENCOUNTER — Other Ambulatory Visit: Payer: Self-pay | Admitting: Internal Medicine

## 2018-04-08 DIAGNOSIS — F509 Eating disorder, unspecified: Secondary | ICD-10-CM | POA: Diagnosis not present

## 2018-04-12 ENCOUNTER — Other Ambulatory Visit: Payer: Self-pay | Admitting: Internal Medicine

## 2018-04-15 DIAGNOSIS — F509 Eating disorder, unspecified: Secondary | ICD-10-CM | POA: Diagnosis not present

## 2018-04-16 MED ORDER — AMPHETAMINE-DEXTROAMPHET ER 20 MG PO CP24: 40.0000 mg | ORAL_CAPSULE | Freq: Every day | ORAL | 0 refills | Status: DC

## 2018-04-16 NOTE — Telephone Encounter (Signed)
Last filled 02/21/18, #60 Last OV 03/26/18  Please advise Dr Fabian SharpPanosh, thanks.

## 2018-04-16 NOTE — Telephone Encounter (Signed)
Sent electronically 

## 2018-04-16 NOTE — Telephone Encounter (Signed)
Relation to pt: self Call back [phone] [id number] [place] [address] [phone] (Phone) [phone] (Fax)       Reason for call:  Patient checking on the status of amphetamine-dextroamphetamine (ADDERALL XR) 20 MG 24 hr capsule , patient states she will run out tomorrow. Informed please allow 72 hour turn around time, please advise via MyChart

## 2018-04-17 ENCOUNTER — Other Ambulatory Visit (INDEPENDENT_AMBULATORY_CARE_PROVIDER_SITE_OTHER): Payer: BLUE CROSS/BLUE SHIELD

## 2018-04-17 DIAGNOSIS — R7989 Other specified abnormal findings of blood chemistry: Secondary | ICD-10-CM | POA: Diagnosis not present

## 2018-04-17 LAB — T4, FREE: Free T4: 0.64 ng/dL (ref 0.60–1.60)

## 2018-04-17 LAB — TSH: TSH: 5.67 u[IU]/mL — ABNORMAL HIGH (ref 0.35–4.50)

## 2018-04-17 LAB — T3, FREE: T3, Free: 3.4 pg/mL (ref 2.3–4.2)

## 2018-04-17 NOTE — Telephone Encounter (Signed)
Pt aware that Rx is sent. Nothing further needed.

## 2018-04-20 ENCOUNTER — Other Ambulatory Visit: Payer: Self-pay | Admitting: Internal Medicine

## 2018-04-21 ENCOUNTER — Encounter: Payer: Self-pay | Admitting: Internal Medicine

## 2018-04-22 ENCOUNTER — Other Ambulatory Visit: Payer: Self-pay

## 2018-04-22 DIAGNOSIS — E039 Hypothyroidism, unspecified: Secondary | ICD-10-CM

## 2018-04-22 MED ORDER — LEVOTHYROXINE SODIUM 50 MCG PO TABS: 50.0000 ug | ORAL_TABLET | Freq: Every day | ORAL | 0 refills | Status: DC

## 2018-04-24 DIAGNOSIS — F509 Eating disorder, unspecified: Secondary | ICD-10-CM | POA: Diagnosis not present

## 2018-04-30 ENCOUNTER — Other Ambulatory Visit: Payer: Self-pay | Admitting: Internal Medicine

## 2018-05-06 DIAGNOSIS — F509 Eating disorder, unspecified: Secondary | ICD-10-CM | POA: Diagnosis not present

## 2018-05-08 ENCOUNTER — Other Ambulatory Visit: Payer: Self-pay | Admitting: Internal Medicine

## 2018-05-15 ENCOUNTER — Telehealth: Payer: Self-pay | Admitting: Internal Medicine

## 2018-05-15 ENCOUNTER — Other Ambulatory Visit: Payer: Self-pay | Admitting: Internal Medicine

## 2018-05-15 NOTE — Telephone Encounter (Signed)
Request for Adderall XR 20 MG 24 hr cap  Dr. Fabian Sharp LOV  03/26/18 NOV  None

## 2018-05-15 NOTE — Telephone Encounter (Signed)
Copied from CRM [id number]. Topic: Quick Communication - See Telephone Encounter >> May 15, 2018  3:04 PM Victoria Carter wrote: Pt is needing a refill on her adderall the [employer] number [phone]

## 2018-05-16 ENCOUNTER — Other Ambulatory Visit: Payer: Self-pay | Admitting: Internal Medicine

## 2018-05-16 MED ORDER — AMPHETAMINE-DEXTROAMPHET ER 20 MG PO CP24: 40.0000 mg | ORAL_CAPSULE | Freq: Every day | ORAL | 0 refills | Status: DC

## 2018-05-16 NOTE — Telephone Encounter (Signed)
Sent in electronically .  

## 2018-05-16 NOTE — Telephone Encounter (Signed)
I called the pt and informed her of the message below

## 2018-05-16 NOTE — Telephone Encounter (Signed)
Patient informed. 

## 2018-05-27 DIAGNOSIS — F509 Eating disorder, unspecified: Secondary | ICD-10-CM | POA: Diagnosis not present

## 2018-06-09 ENCOUNTER — Other Ambulatory Visit (INDEPENDENT_AMBULATORY_CARE_PROVIDER_SITE_OTHER): Payer: BLUE CROSS/BLUE SHIELD

## 2018-06-09 DIAGNOSIS — E039 Hypothyroidism, unspecified: Secondary | ICD-10-CM | POA: Diagnosis not present

## 2018-06-09 LAB — TSH: TSH: 4.17 u[IU]/mL (ref 0.35–4.50)

## 2018-06-11 ENCOUNTER — Other Ambulatory Visit: Payer: Self-pay | Admitting: Internal Medicine

## 2018-06-11 DIAGNOSIS — G4733 Obstructive sleep apnea (adult) (pediatric): Secondary | ICD-10-CM | POA: Diagnosis not present

## 2018-06-16 ENCOUNTER — Other Ambulatory Visit: Payer: Self-pay | Admitting: Internal Medicine

## 2018-06-16 NOTE — Telephone Encounter (Signed)
Copied from CRM [id number]. Topic: Inquiry >> Jun 16, 2018  3:12 PM Stephannie LiSimmons, Janett L, NT wrote: Reason for CRM: Patient called to follow up on request sent on 06/11/18 in regards to  a refills for  amphetamine-dextroamphetamine (ADDERALL XR) 20 MG 24 hr capsule and she states the manufacture needs to be [employer]

## 2018-06-17 MED ORDER — AMPHETAMINE-DEXTROAMPHET ER 20 MG PO CP24: 40.0000 mg | ORAL_CAPSULE | Freq: Every day | ORAL | 0 refills | Status: DC

## 2018-06-17 NOTE — Telephone Encounter (Signed)
Last filled 05/16/18, #60  Please advise Dr Fabian SharpPanosh, thanks.

## 2018-06-17 NOTE — Telephone Encounter (Signed)
Sent in electronically .  

## 2018-06-17 NOTE — Telephone Encounter (Signed)
[facility]

## 2018-06-17 NOTE — Telephone Encounter (Signed)
See refill encounter from 06/11/18. Pt calling to follow up on refill request for Adderall.

## 2018-06-17 NOTE — Telephone Encounter (Signed)
Refill request has been sent to Dr Fabian SharpPanosh  Pt states that she will be completely as of today .  See refill request 06/11/18

## 2018-06-17 NOTE — Telephone Encounter (Signed)
Pt states that [facility] [address] [facility] not [facility] medication available.  [facility]eds to be [facility] [address] [address]  Will send to Dr Fabian SharpPanosh to resend the Rx

## 2018-06-24 DIAGNOSIS — F509 Eating disorder, unspecified: Secondary | ICD-10-CM | POA: Diagnosis not present

## 2018-07-10 ENCOUNTER — Ambulatory Visit (INDEPENDENT_AMBULATORY_CARE_PROVIDER_SITE_OTHER)
Admission: RE | Admit: 2018-07-10 | Discharge: 2018-07-10 | Disposition: A | Discharge status: Home / Self Care | Payer: BLUE CROSS/BLUE SHIELD | Source: Ambulatory Visit | Attending: Internal Medicine | Admitting: Internal Medicine

## 2018-07-10 ENCOUNTER — Ambulatory Visit (INDEPENDENT_AMBULATORY_CARE_PROVIDER_SITE_OTHER): Payer: BLUE CROSS/BLUE SHIELD | Admitting: Internal Medicine

## 2018-07-10 VITALS — BP 100/77 | HR 81 | Temp 98.5°F | Wt 196.0 lb

## 2018-07-10 DIAGNOSIS — F902 Attention-deficit hyperactivity disorder, combined type: Secondary | ICD-10-CM | POA: Diagnosis not present

## 2018-07-10 DIAGNOSIS — E039 Hypothyroidism, unspecified: Secondary | ICD-10-CM

## 2018-07-10 DIAGNOSIS — M7989 Other specified soft tissue disorders: Secondary | ICD-10-CM

## 2018-07-10 DIAGNOSIS — M79644 Pain in right finger(s): Secondary | ICD-10-CM

## 2018-07-10 DIAGNOSIS — Z79899 Other long term (current) drug therapy: Secondary | ICD-10-CM | POA: Diagnosis not present

## 2018-07-10 IMAGING — DX DG FINGER THUMB 2+V*R*
3 series · 3 of 3 positions shown · non-contrast
Comparison: None

CLINICAL DATA: Pain and swelling for 6 days, no known injury,
possible overuse injury strain

EXAM:
RIGHT THUMB 2+V

[finger ap]
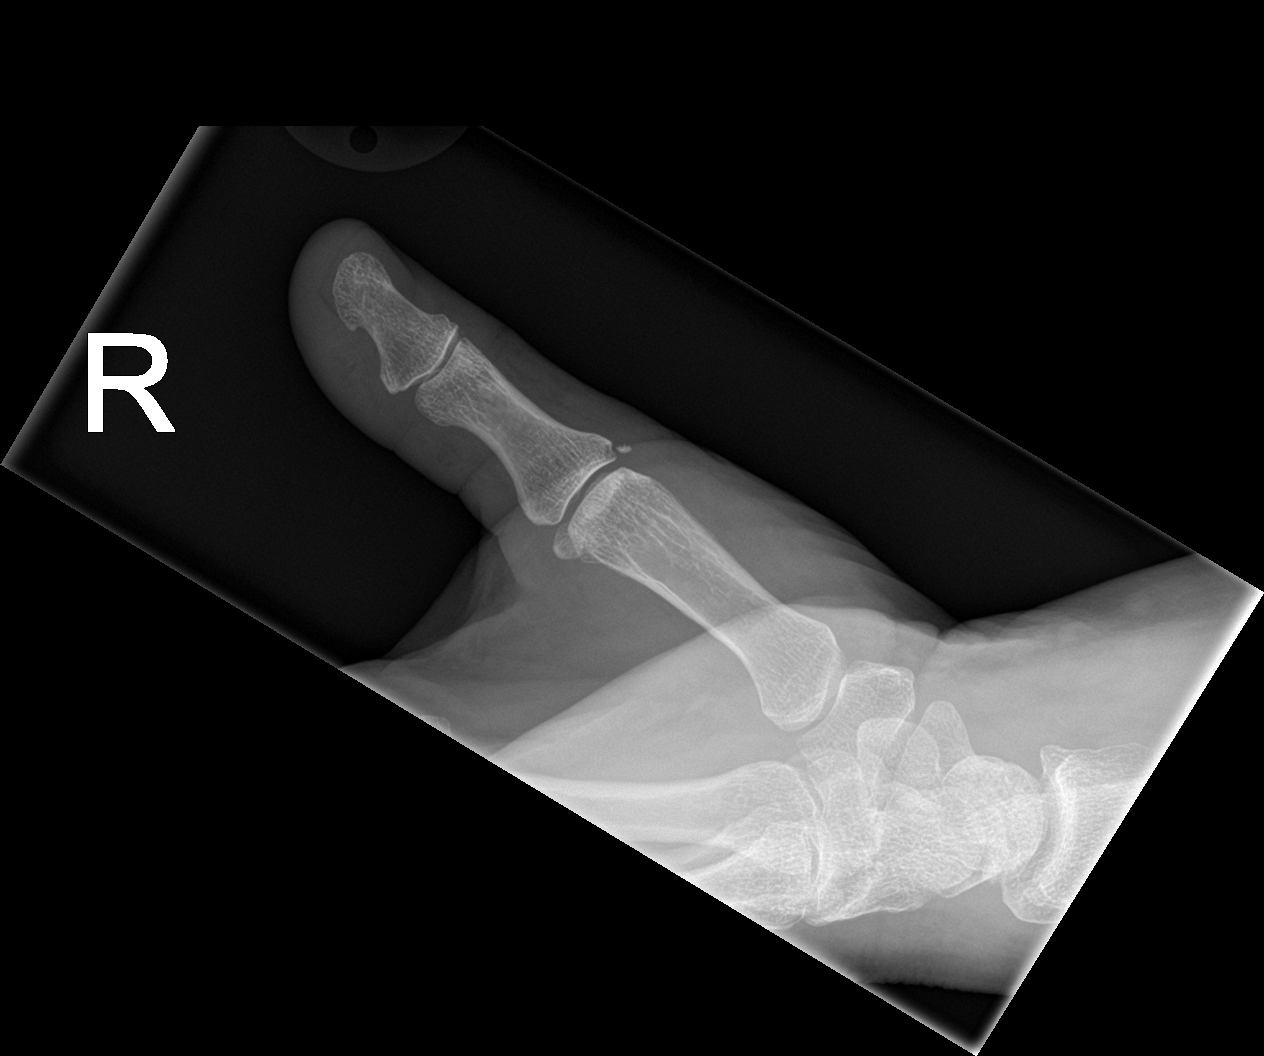

[finger obl]
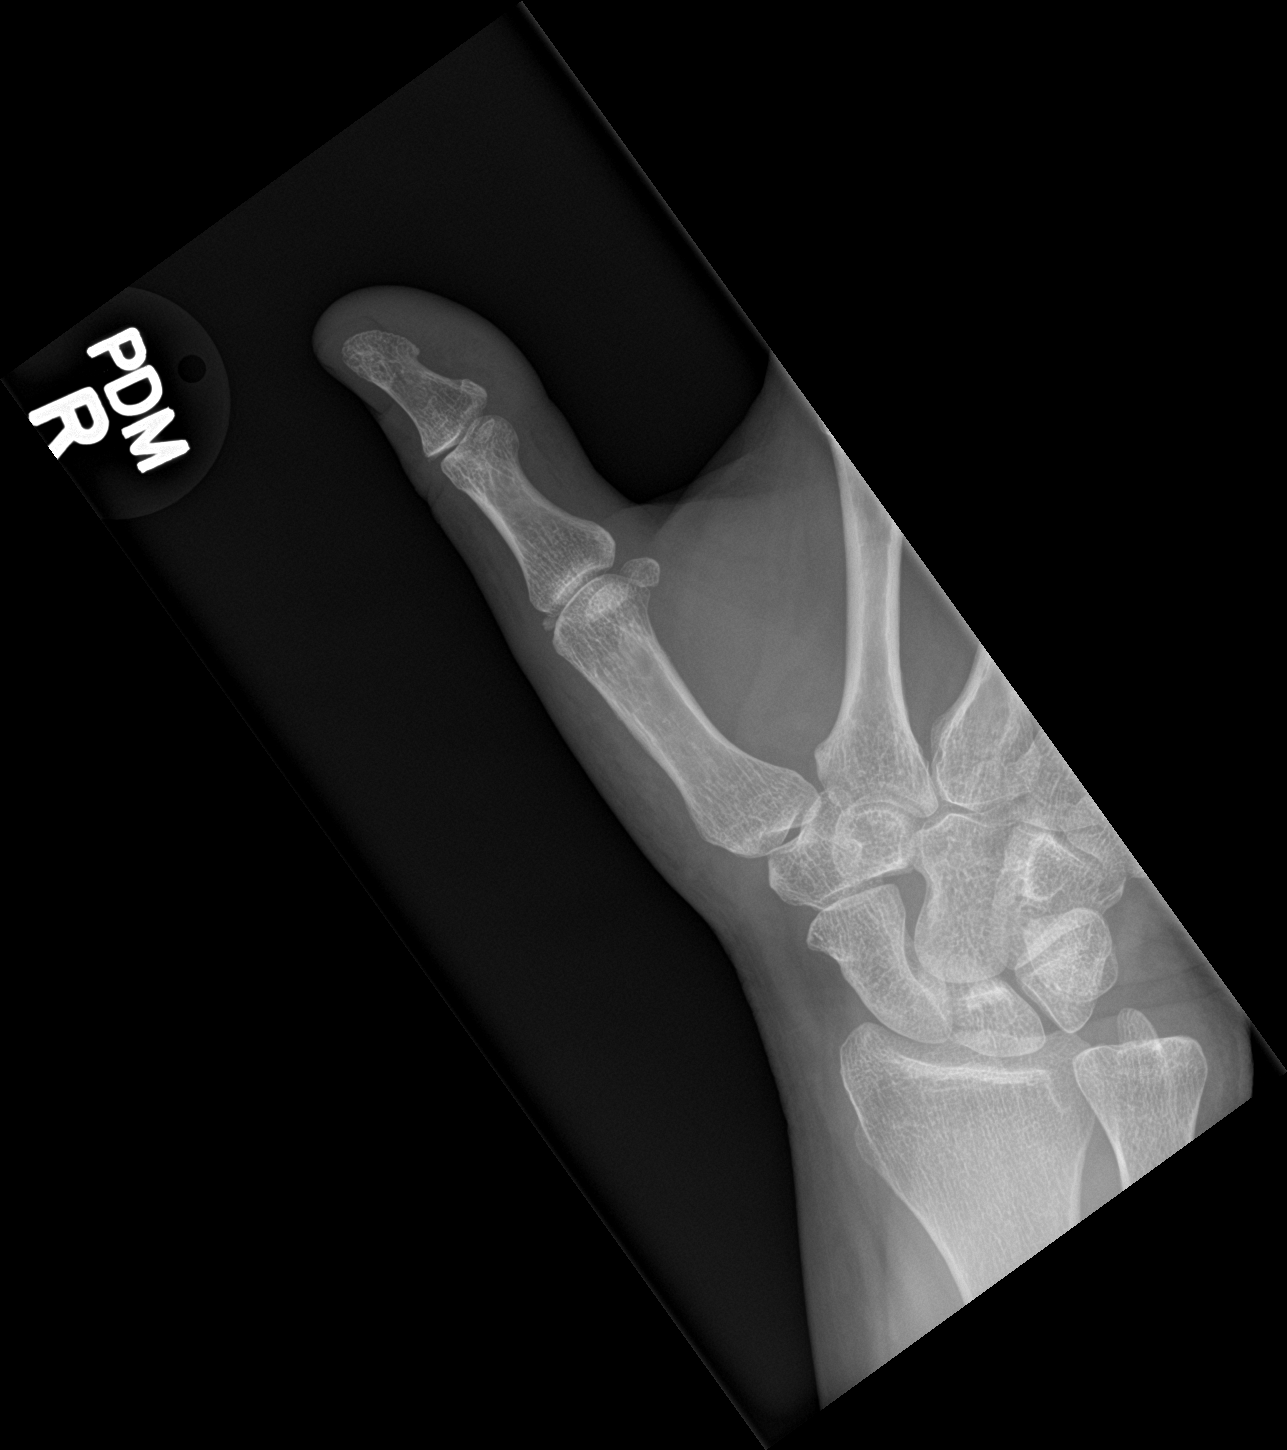

[finger lat]
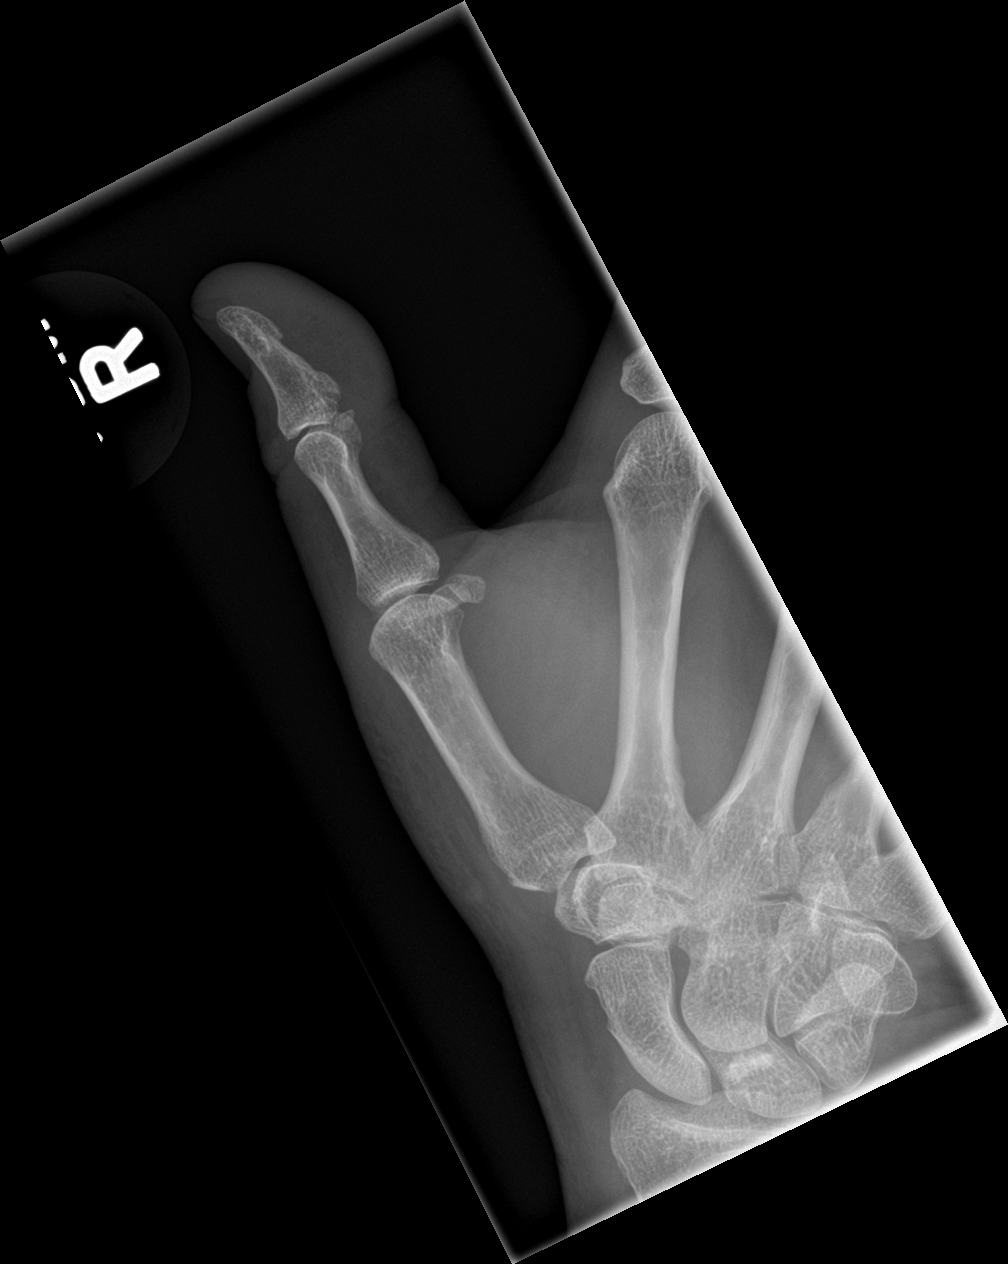

[3 of 3 positions shown; findings below may reference images not displayed]

FINDINGS: Osseous mineralization normal.

Joint spaces preserved.

Tiny corticated ossicle at the radial margin, base of proximal
phalanx RIGHT thumb.

Adjacent margin of the base of the proximal phalanx is appears
concave and is corticated as well, question sequela of remote radial
collateral ligament avulsion.

No definite acute fracture, dislocation or bone destruction.
IMPRESSION: No acute osseous abnormalities.

Corticated ossicle with adjacent corticated concave margin at the
radial aspect base of proximal phalanx RIGHT thumb, could reflect
sequela of a remote radial collateral ligament avulsion injury.

## 2018-07-10 MED ORDER — LEVOTHYROXINE SODIUM 50 MCG PO TABS: 50.0000 ug | ORAL_TABLET | Freq: Every day | ORAL | 0 refills | Status: DC

## 2018-07-10 MED ORDER — AMPHETAMINE-DEXTROAMPHET ER 20 MG PO CP24: 40.0000 mg | ORAL_CAPSULE | Freq: Every day | ORAL | 0 refills | Status: DC

## 2018-07-10 NOTE — Progress Notes (Signed)
Chief Complaint  Patient presents with  . thumb pain    right    HPI: Victoria Carter 53 y.o.  SDA  For  Injury  Working  To get et house ready to sell an doing a lot of exercise and   Pressure washing and     Iced Saturday and better on Monday and now worse .  No prov fall injury ?  Mood stable  Never got in with psych but feels a bt better on current meds  On wellbutrin and prozac   Ask for refill adderall still working and helps has to have specific manufacturer   Thyroid med   Needs refill had  tsh  ROS: See pertinent positives and negatives per HPI.  Past Medical History:  Diagnosis Date  . Allergic rhinitis   . Depression   . Hx of abnormal cervical Pap smear    used cryo when first married and pregnant  . Hyperglycemia   . Hyperlipidemia   . Sinusitis   . UTI (lower urinary tract infection)     Family History  Problem Relation Age of Onset  . Atrial fibrillation Mother   . Heart attack Brother        age 18  . Diabetes type II Unknown   . Hyperlipidemia Unknown   . Factor V Leiden deficiency Daughter        Also husband    Social History   Socioeconomic History  . Marital status: Legally Separated    Spouse name: Not on file  . Number of children: Not on file  . Years of education: Not on file  . Highest education level: Not on file  Occupational History  . Not on file  Social Needs  . Financial resource strain: Not on file  . Food insecurity:    Worry: Not on file    Inability: Not on file  . Transportation needs:    Medical: Not on file    Non-medical: Not on file  Tobacco Use  . Smoking status: Former Smoker    Last attempt to quit: 04/12/2009    Years since quitting: 9.2  . Smokeless tobacco: Never Used  Substance and Sexual Activity  . Alcohol use: Yes    Alcohol/week: 0.6 oz    Types: 1 Glasses of wine per week  . Drug use: No  . Sexual activity: Not on file  Lifestyle  . Physical activity:    Days per week: Not on file    Minutes  per session: Not on file  . Stress: Not on file  Relationships  . Social connections:    Talks on phone: Not on file    Gets together: Not on file    Attends religious service: Not on file    Active member of club or organization: Not on file    Attends meetings of clubs or organizations: Not on file    Relationship status: Not on file  Other Topics Concern  . Not on file  Social History Narrative   Occupation: 40 per week 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  [employer]  In stress position    Married now separated  05-10-2017    Regular exercise- yes  yoga   HH of 1-  girls home from college   Pet dogs passed away 05/10/16   G2P2    Outpatient Medications Prior to Visit  Medication Sig Dispense Refill  . Acetaminophen (TYLENOL PO) Take 1,000 mg by mouth as needed for pain.    Marland Kitchen  amphetamine-dextroamphetamine (ADDERALL XR) 20 MG 24 hr capsule Take 2 capsules (40 mg total) by mouth daily. 60 capsule 0  . atorvastatin (LIPITOR) 80 MG tablet TAKE 0.5 TABLETS (40 MG TOTAL) BY MOUTH DAILY. 45 tablet 1  . buPROPion (WELLBUTRIN XL) 150 MG 24 hr tablet Take 2 tablets (300 mg total) by mouth daily. Increase to 300 mg per day after  3 weeks  . (Patient taking differently: Take 300 mg by mouth daily. ) 30 tablet 0  . Calcium 500 MG CHEW 1 chewable tablet daily 30 tablet 6  . FLUoxetine (PROZAC) 20 MG capsule TAKE 2 CAPSULES BY MOUTH EVERY DAY 180 capsule 1  . Multiple Vitamins-Minerals ([facility] WOMENS DAILY GUMMIES) CHEW Chew 2 Units by mouth daily. 60 tablet 6  . ranitidine (ZANTAC) 150 MG tablet TAKE 1 TABLET BY MOUTH TWICE A DAY 60 tablet 5  . levothyroxine (SYNTHROID, LEVOTHROID) 50 MCG tablet Take 1 tablet (50 mcg total) by mouth daily. 90 tablet 0  . acetaminophen (TYLENOL) 500 MG tablet Take 2 tablets (1,000 mg total) by mouth every 6 (six) hours as needed for mild pain or moderate pain. (Patient not taking: Reported on 07/10/2018) 60 tablet 6  . amphetamine-dextroamphetamine (ADDERALL XR) 20 MG 24 hr capsule Take 2 capsules (40  mg total) by mouth daily (Patient not taking: Reported on 07/10/2018) 60 capsule 0  . amphetamine-dextroamphetamine (ADDERALL XR) 20 MG 24 hr capsule Take 2 capsules (40 mg total) by mouth daily. [employer] (Patient not taking: Reported on 07/10/2018) 60 capsule 0  . buPROPion (WELLBUTRIN XL) 150 MG 24 hr tablet TAKE 2 TABLETS (300 MG TOTAL) BY MOUTH DAILY. (Patient not taking: Reported on 07/10/2018) 180 tablet 0  . LORazepam (ATIVAN) 0.5 MG tablet Take 1-2 tablets (0.5-1 mg total) by mouth 2 (two) times daily as needed for anxiety. (Patient not taking: Reported on 07/10/2018) 24 tablet 0  . Multiple Vitamins-Minerals (MULTIPLE VITAMINS/WOMENS PO) Chew 2 gummys daily     No facility-administered medications prior to visit.      EXAM:  BP 100/77   Pulse 81   Temp 98.5 F (36.9 C)   Wt 196 lb (88.9 kg)   BMI 31.26 kg/m   Body mass index is 31.26 kg/m.  GENERAL: vitals reviewed and listed above, alert, oriented, appears well hydrated and in no acute distress HEENT: atraumatic, conjunctiva  clear, no obvious abnormalities on inspection of external nose and ears OP : no lesion edema or exudate  NMS: moves all extremities right thumb mild edema at thenar eminence  Tender radial area of thumb no distortion or  dislocation    rom limited by pain opposition ok to middle finger  No snap and nv intact   joint appear st stable   On lateral motion  PSYCH: pleasant and cooperative, no obvious depression or anxiety  ASSESSMENT AND PLAN:  Discussed the following assessment and plan:  Thumb pain, right - Plan: DG Finger Thumb Right  Swelling of right thumb - Plan: DG Finger Thumb Right  Medication management  Attention deficit hyperactivity disorder (ADHD), combined type  Hypothyroidism, unspecified type prob ligament strain and or tendinitis    Get x ray plan spica splint for 1-2 weeks depending on x ary and  Ice nsaids   -Patient advised to return or notify health care team  if symptoms  worsen ,persist or new concerns arise.  Patient Instructions  This acts like an overuse strain .  Injury .    X ray and  Ice and  Spica splint  For a week   Or  2  so until better  May take weeks.   Antiinflammatory .  Such as advil aleve   Get x ray today   cpx of med check in  4-5 months      Wanda K. Panosh M.D.  Lab Results  Component Value Date   WBC 5.6 12/30/2017   HGB 13.2 12/30/2017   HCT 39.9 12/30/2017   PLT 251.0 12/30/2017   GLUCOSE 116 (H) 12/30/2017   CHOL 145 12/30/2017   TRIG 52.0 12/30/2017   HDL 50.20 12/30/2017   LDLDIRECT 88.6 11/01/2014   LDLCALC 85 12/30/2017   ALT 28 12/30/2017   AST 20 12/30/2017   NA 141 12/30/2017   K 4.4 12/30/2017   CL 109 12/30/2017   CREATININE 0.86 12/30/2017   BUN 12 12/30/2017   CO2 26 12/30/2017   TSH 4.17 06/09/2018   HGBA1C 5.8 12/30/2017

## 2018-07-10 NOTE — Patient Instructions (Addendum)
This acts like an overuse strain .  Injury .    X ray and  Ice and  Spica splint  For a week   Or  2  so until better  May take weeks.   Antiinflammatory .  Such as advil aleve   Get x ray today   cpx of med check in  4-5 months

## 2018-07-11 ENCOUNTER — Encounter: Payer: Self-pay | Admitting: Internal Medicine

## 2018-07-16 DIAGNOSIS — F509 Eating disorder, unspecified: Secondary | ICD-10-CM | POA: Diagnosis not present

## 2018-07-17 DIAGNOSIS — M79641 Pain in right hand: Secondary | ICD-10-CM | POA: Diagnosis not present

## 2018-07-21 DIAGNOSIS — S63641S Sprain of metacarpophalangeal joint of right thumb, sequela: Secondary | ICD-10-CM | POA: Diagnosis not present

## 2018-07-21 DIAGNOSIS — R202 Paresthesia of skin: Secondary | ICD-10-CM | POA: Diagnosis not present

## 2018-07-21 DIAGNOSIS — M6281 Muscle weakness (generalized): Secondary | ICD-10-CM | POA: Diagnosis not present

## 2018-07-21 DIAGNOSIS — M25541 Pain in joints of right hand: Secondary | ICD-10-CM | POA: Diagnosis not present

## 2018-07-28 DIAGNOSIS — M6281 Muscle weakness (generalized): Secondary | ICD-10-CM | POA: Diagnosis not present

## 2018-07-28 DIAGNOSIS — R202 Paresthesia of skin: Secondary | ICD-10-CM | POA: Diagnosis not present

## 2018-07-28 DIAGNOSIS — M25541 Pain in joints of right hand: Secondary | ICD-10-CM | POA: Diagnosis not present

## 2018-07-28 DIAGNOSIS — S63641S Sprain of metacarpophalangeal joint of right thumb, sequela: Secondary | ICD-10-CM | POA: Diagnosis not present

## 2018-08-11 DIAGNOSIS — R202 Paresthesia of skin: Secondary | ICD-10-CM | POA: Diagnosis not present

## 2018-08-11 DIAGNOSIS — S63641S Sprain of metacarpophalangeal joint of right thumb, sequela: Secondary | ICD-10-CM | POA: Diagnosis not present

## 2018-08-11 DIAGNOSIS — M6281 Muscle weakness (generalized): Secondary | ICD-10-CM | POA: Diagnosis not present

## 2018-08-11 DIAGNOSIS — M25541 Pain in joints of right hand: Secondary | ICD-10-CM | POA: Diagnosis not present

## 2018-08-12 ENCOUNTER — Other Ambulatory Visit: Payer: Self-pay

## 2018-08-13 NOTE — Telephone Encounter (Signed)
Last OV and refill 07/10/18, #60 x 0rf  Please advise Dr Fabian SharpPanosh, thanks.

## 2018-08-14 MED ORDER — AMPHETAMINE-DEXTROAMPHET ER 20 MG PO CP24
40.0000 mg | ORAL_CAPSULE | Freq: Every day | ORAL | 0 refills | Status: DC
Start: 2018-08-14 — End: 2018-09-15

## 2018-08-14 NOTE — Telephone Encounter (Signed)
Sent in electronically .  

## 2018-08-17 ENCOUNTER — Other Ambulatory Visit: Payer: Self-pay | Admitting: Internal Medicine

## 2018-08-18 NOTE — Telephone Encounter (Signed)
Dr. Fabian SharpPanosh, please advise on the refill for this pt.   Last OV--07/10/2018 Last refill was 03/26/2018 for #30 with no refills.   Thanks.

## 2018-08-18 NOTE — Telephone Encounter (Signed)
Sent in electronically .  

## 2018-08-19 ENCOUNTER — Other Ambulatory Visit: Payer: Self-pay | Admitting: Internal Medicine

## 2018-08-27 DIAGNOSIS — M25541 Pain in joints of right hand: Secondary | ICD-10-CM | POA: Diagnosis not present

## 2018-08-27 DIAGNOSIS — R202 Paresthesia of skin: Secondary | ICD-10-CM | POA: Diagnosis not present

## 2018-08-27 DIAGNOSIS — M6281 Muscle weakness (generalized): Secondary | ICD-10-CM | POA: Diagnosis not present

## 2018-08-27 DIAGNOSIS — S63641S Sprain of metacarpophalangeal joint of right thumb, sequela: Secondary | ICD-10-CM | POA: Diagnosis not present

## 2018-08-31 ENCOUNTER — Other Ambulatory Visit: Payer: Self-pay | Admitting: Internal Medicine

## 2018-09-10 DIAGNOSIS — M6281 Muscle weakness (generalized): Secondary | ICD-10-CM | POA: Diagnosis not present

## 2018-09-10 DIAGNOSIS — S63641S Sprain of metacarpophalangeal joint of right thumb, sequela: Secondary | ICD-10-CM | POA: Diagnosis not present

## 2018-09-10 DIAGNOSIS — R202 Paresthesia of skin: Secondary | ICD-10-CM | POA: Diagnosis not present

## 2018-09-10 DIAGNOSIS — M25541 Pain in joints of right hand: Secondary | ICD-10-CM | POA: Diagnosis not present

## 2018-09-11 DIAGNOSIS — K625 Hemorrhage of anus and rectum: Secondary | ICD-10-CM | POA: Diagnosis not present

## 2018-09-11 DIAGNOSIS — K580 Irritable bowel syndrome with diarrhea: Secondary | ICD-10-CM | POA: Diagnosis not present

## 2018-09-11 DIAGNOSIS — Z86010 Personal history of colonic polyps: Secondary | ICD-10-CM | POA: Diagnosis not present

## 2018-09-11 DIAGNOSIS — K600 Acute anal fissure: Secondary | ICD-10-CM | POA: Diagnosis not present

## 2018-09-12 LAB — BASIC METABOLIC PANEL
BUN: 12 (ref 4–21)
Creatinine: 1.1 (ref 0.5–1.1)
Glucose: 71
Potassium: 4.1 (ref 3.4–5.3)
Sodium: 142 (ref 137–147)

## 2018-09-12 LAB — CBC AND DIFFERENTIAL
HCT: 41 (ref 36–46)
Hemoglobin: 13.5 (ref 12.0–16.0)
Platelets: 291 (ref 150–399)
WBC: 6.5

## 2018-09-12 LAB — TSH: TSH: 3.23 (ref 0.41–5.90)

## 2018-09-12 LAB — HEPATIC FUNCTION PANEL
ALT: 43 — AB (ref 7–35)
AST: 28 (ref 13–35)
Alkaline Phosphatase: 75 (ref 25–125)
Bilirubin, Total: 0.4

## 2018-09-15 ENCOUNTER — Telehealth: Payer: Self-pay | Admitting: Internal Medicine

## 2018-09-15 MED ORDER — AMPHETAMINE-DEXTROAMPHET ER 20 MG PO CP24: 40.0000 mg | ORAL_CAPSULE | Freq: Every day | ORAL | 0 refills | Status: DC

## 2018-09-15 NOTE — Telephone Encounter (Signed)
Copied from CRM [id number]. Topic: Quick Communication - See Telephone Encounter >> Sep 15, 2018 10:35 AM Windy KalataMichael, Taylor L, NT wrote: CRM for notification. See Telephone encounter for: 09/15/18.  Patient is calling requesting a refill on amphetamine-dextroamphetamine (ADDERALL XR) 20 MG 24 hr capsule. She would like this one sent to the pharmacy but for future she would like to get the 3 month supply written prescriptions because she has to call every pharmacy to see which one has it in stock before requesting a refill.   [id number] [place] [place] [facility] [address] [place] [id number] Phone: [phone] Fax: [phone]

## 2018-09-15 NOTE — Telephone Encounter (Signed)
Last filled 08/14/18 #60 x 0rf  Please advise Dr Fabian SharpPanosh, thanks.

## 2018-09-15 NOTE — Telephone Encounter (Signed)
Sent in electronically .  

## 2018-09-15 NOTE — Telephone Encounter (Signed)
For clarification per Dr Fabian SharpPanosh and documentation purposes -- pt was requesting a 30 day supply be sent electronically to pharmacy listed below but further refills be printed (3 rx's) 90 day supply so that she may call around and see who has it in stock. Pharmacies are getting to where they are not having this in stock and it causes confusion and delay in getting Rx if we have to send to once pharmacy and call to cancel it and send to another pharmacy to find somewhere that has it in stock.   Nothing further needed.

## 2018-09-24 ENCOUNTER — Other Ambulatory Visit: Payer: Self-pay | Admitting: Internal Medicine

## 2018-09-24 DIAGNOSIS — M25541 Pain in joints of right hand: Secondary | ICD-10-CM | POA: Diagnosis not present

## 2018-09-24 DIAGNOSIS — R202 Paresthesia of skin: Secondary | ICD-10-CM | POA: Diagnosis not present

## 2018-09-24 DIAGNOSIS — Z1231 Encounter for screening mammogram for malignant neoplasm of breast: Secondary | ICD-10-CM

## 2018-09-24 DIAGNOSIS — S63641S Sprain of metacarpophalangeal joint of right thumb, sequela: Secondary | ICD-10-CM | POA: Diagnosis not present

## 2018-09-24 DIAGNOSIS — M6281 Muscle weakness (generalized): Secondary | ICD-10-CM | POA: Diagnosis not present

## 2018-09-29 DIAGNOSIS — M6281 Muscle weakness (generalized): Secondary | ICD-10-CM | POA: Diagnosis not present

## 2018-09-29 DIAGNOSIS — S63641S Sprain of metacarpophalangeal joint of right thumb, sequela: Secondary | ICD-10-CM | POA: Diagnosis not present

## 2018-09-29 DIAGNOSIS — M25541 Pain in joints of right hand: Secondary | ICD-10-CM | POA: Diagnosis not present

## 2018-09-29 DIAGNOSIS — R202 Paresthesia of skin: Secondary | ICD-10-CM | POA: Diagnosis not present

## 2018-10-02 DIAGNOSIS — L219 Seborrheic dermatitis, unspecified: Secondary | ICD-10-CM | POA: Diagnosis not present

## 2018-10-02 DIAGNOSIS — D2271 Melanocytic nevi of right lower limb, including hip: Secondary | ICD-10-CM | POA: Diagnosis not present

## 2018-10-02 DIAGNOSIS — D2262 Melanocytic nevi of left upper limb, including shoulder: Secondary | ICD-10-CM | POA: Diagnosis not present

## 2018-10-02 DIAGNOSIS — D225 Melanocytic nevi of trunk: Secondary | ICD-10-CM | POA: Diagnosis not present

## 2018-10-02 DIAGNOSIS — D485 Neoplasm of uncertain behavior of skin: Secondary | ICD-10-CM | POA: Diagnosis not present

## 2018-10-02 DIAGNOSIS — Z23 Encounter for immunization: Secondary | ICD-10-CM | POA: Diagnosis not present

## 2018-10-07 ENCOUNTER — Telehealth: Payer: Self-pay | Admitting: Internal Medicine

## 2018-10-09 NOTE — Telephone Encounter (Signed)
Copied from CRM [id number]. Topic: Quick Communication - See Telephone Encounter >> Oct 09, 2018 11:01 AM Gean Birchwood R wrote: Patient is calling in for a paper script of amphetamine-dextroamphetamine (ADDERALL XR) 20 MG 24 hr capsule  actava

## 2018-10-09 NOTE — Telephone Encounter (Signed)
Patient is waiting approval of med due to Dr Fabian Sharp being out of office

## 2018-10-14 ENCOUNTER — Other Ambulatory Visit: Payer: Self-pay | Admitting: Adult Health

## 2018-10-14 MED ORDER — AMPHETAMINE-DEXTROAMPHET ER 20 MG PO CP24: 40.0000 mg | ORAL_CAPSULE | Freq: Every day | ORAL | 0 refills | Status: DC

## 2018-10-14 NOTE — Telephone Encounter (Signed)
Patient is calling and saying that she never heard back about her ADDERALL. [facility] [address] Please Advise.

## 2018-10-14 NOTE — Telephone Encounter (Signed)
Pt aware that her Rx has been sent in. Nothing further needed.

## 2018-10-14 NOTE — Telephone Encounter (Signed)
Message just received today.  Dr Fabian Sharp is out of office until 10/16/18 Will see if another Provider can refill to avoid further delay since the patient requested this refill on 10/07/18  Please advise [name] if you can send this one time refill for the patient.   Last refilled 09/15/18 #60 Last OV 03/26/18 advised to follow up 6 months (08/2018) for Med Check.  Pt is due for OV now.   Thanks.

## 2018-10-14 NOTE — Addendum Note (Signed)
Addended by: Maisie Fus on: 10/14/2018 03:48 PM   Modules accepted: Orders

## 2018-10-14 NOTE — Telephone Encounter (Signed)
Sent in

## 2018-10-22 ENCOUNTER — Ambulatory Visit
Admission: RE | Admit: 2018-10-22 | Discharge: 2018-10-22 | Disposition: A | Discharge status: Home / Self Care | Payer: BLUE CROSS/BLUE SHIELD | Source: Ambulatory Visit | Attending: Internal Medicine | Admitting: Internal Medicine

## 2018-10-22 DIAGNOSIS — Z1231 Encounter for screening mammogram for malignant neoplasm of breast: Secondary | ICD-10-CM | POA: Diagnosis not present

## 2018-10-22 IMAGING — MG DIGITAL SCREENING BILATERAL MAMMOGRAM WITH TOMO AND CAD
8 series · 8 of 24 positions shown · non-contrast
Comparison: Previous exam(s).

CLINICAL DATA: Screening.

EXAM:
DIGITAL SCREENING BILATERAL MAMMOGRAM WITH TOMO AND CAD

[L MLO synth-2D]
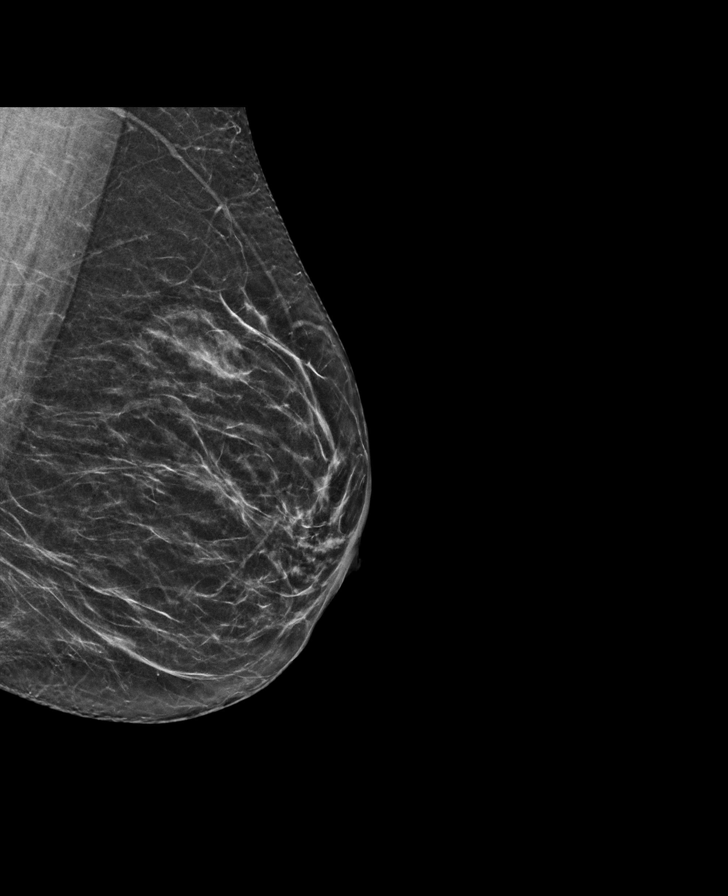

[L CC synth-2D]
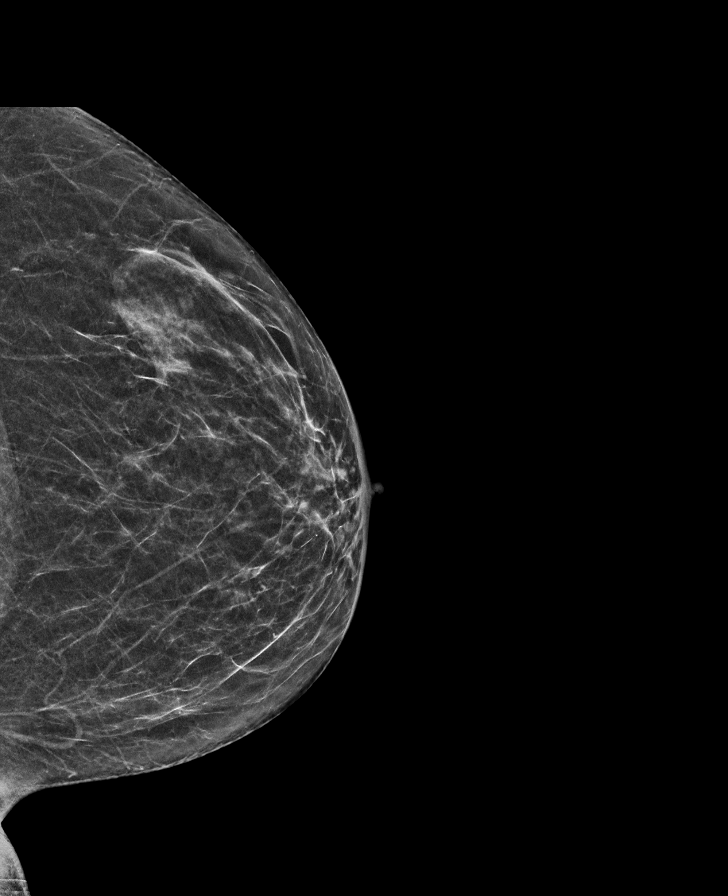

[R MLO synth-2D]
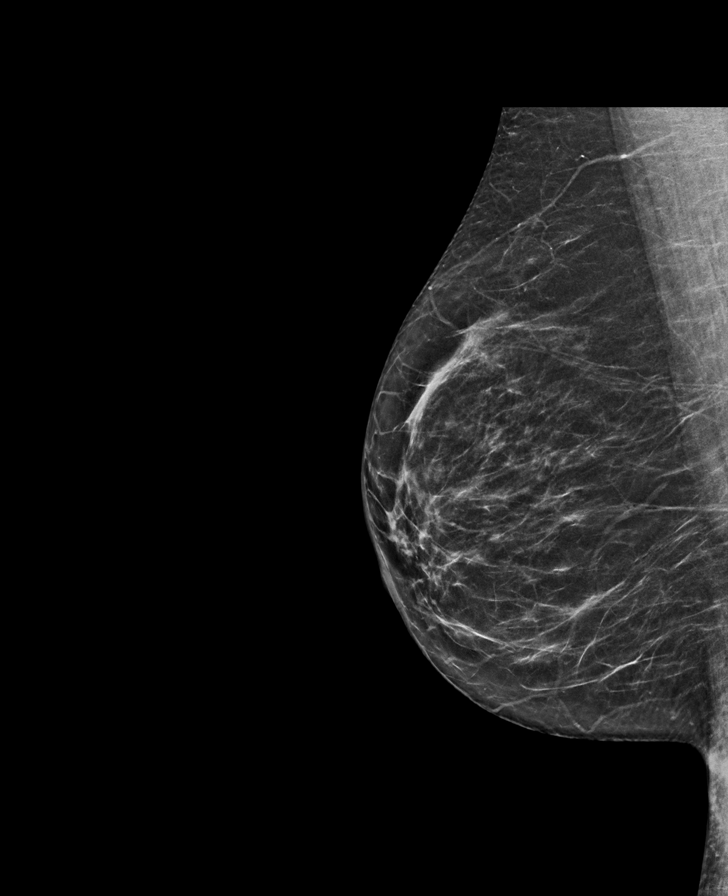

[R CC synth-2D]
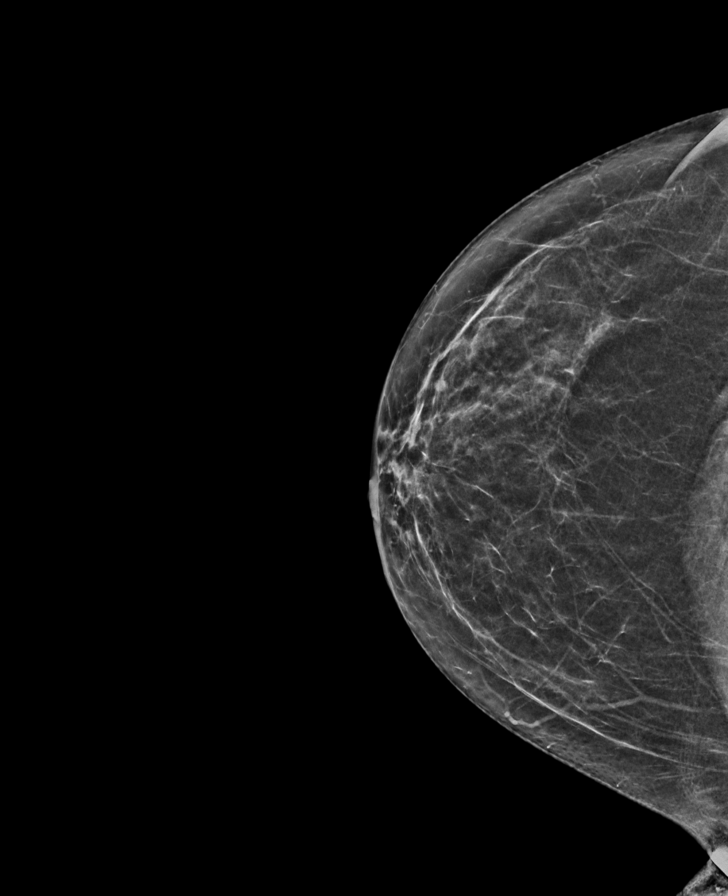

[R MLO tomo · tomo slice 32/63.0]
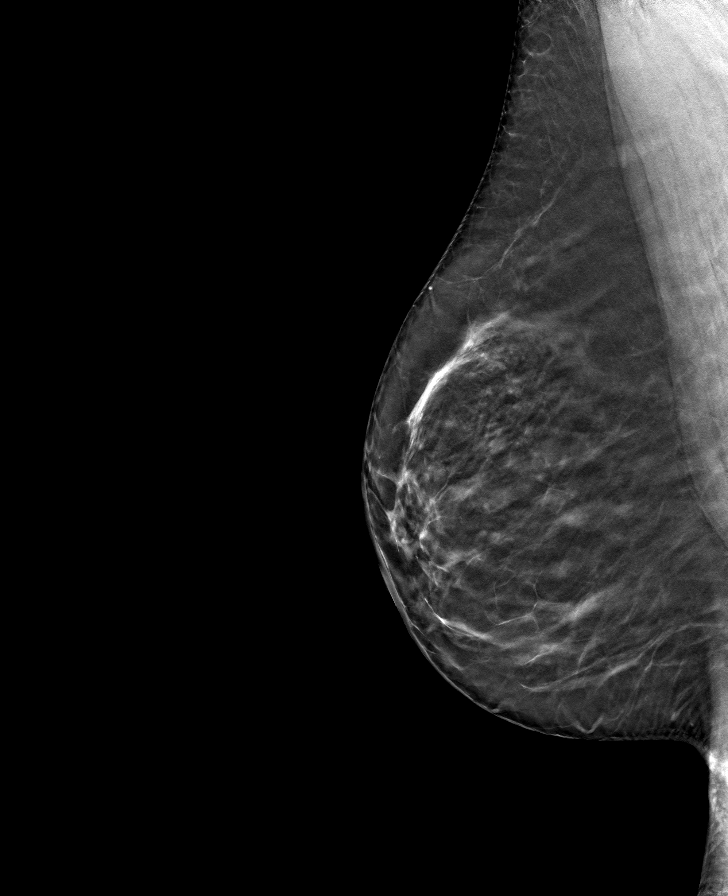

[L MLO tomo · tomo slice 31/61.0]
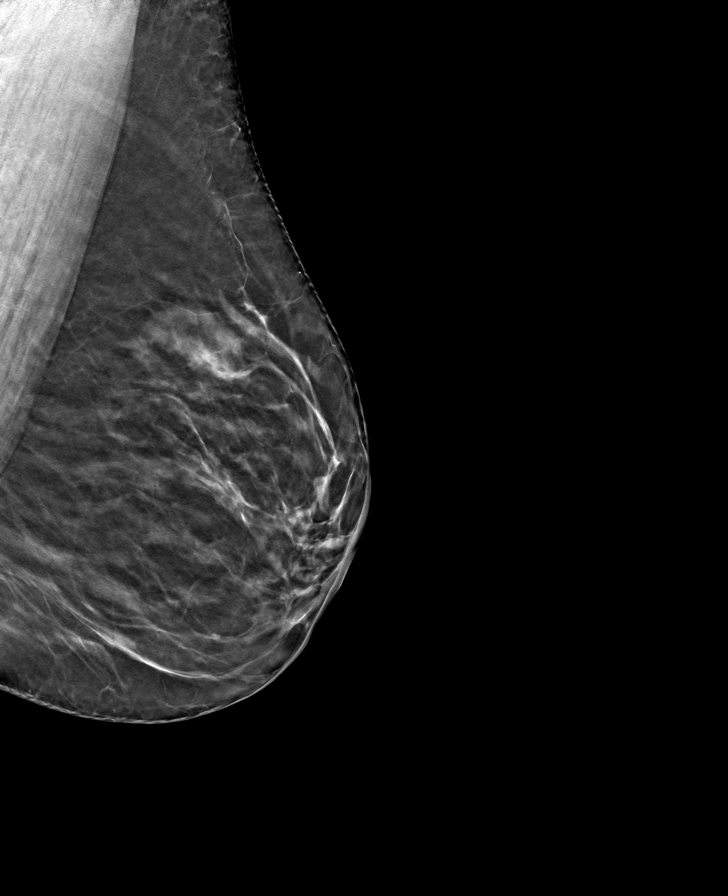

[L CC tomo · tomo slice 27/54.0]
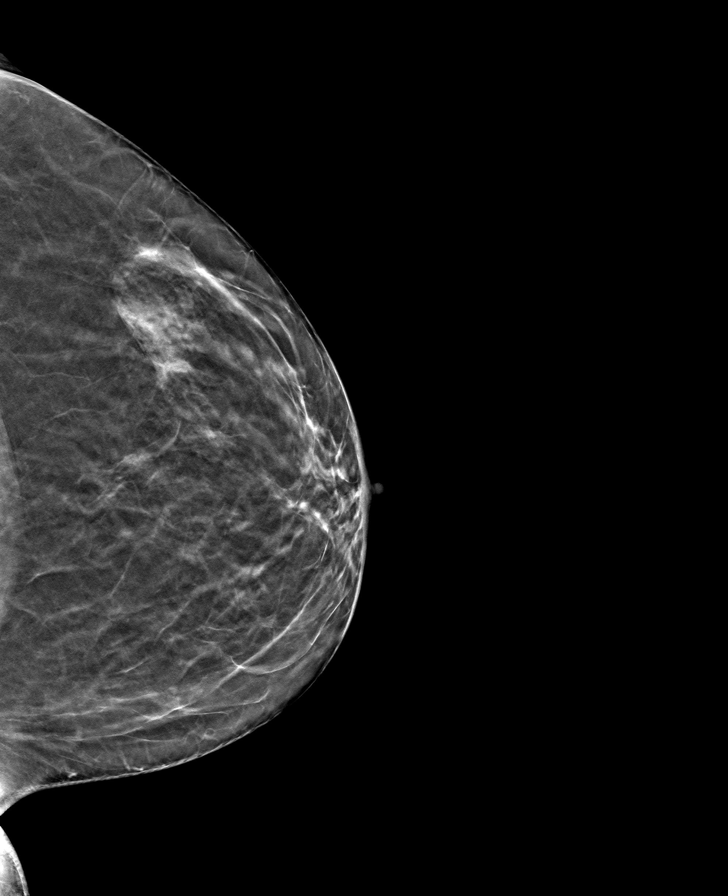

[R CC tomo · tomo slice 30/59.0]
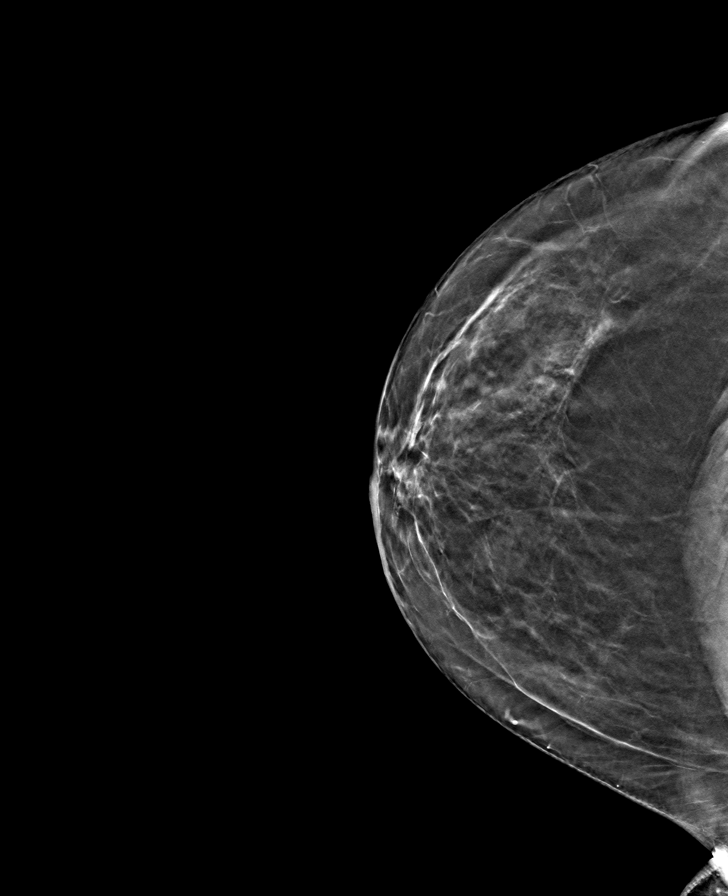

[8 of 24 positions shown; findings below may reference images not displayed]

ACR Breast Density Category b: There are scattered areas of
fibroglandular density.
FINDINGS: There are no findings suspicious for malignancy. Images were
processed with CAD.
IMPRESSION: No mammographic evidence of malignancy. A result letter of this
screening mammogram will be mailed directly to the patient.

RECOMMENDATION:
Screening mammogram in one year. (Code:[id number])

BI-RADS CATEGORY  1: Negative.

## 2018-10-28 DIAGNOSIS — G4733 Obstructive sleep apnea (adult) (pediatric): Secondary | ICD-10-CM | POA: Diagnosis not present

## 2018-11-10 ENCOUNTER — Other Ambulatory Visit: Payer: Self-pay | Admitting: Internal Medicine

## 2018-11-10 ENCOUNTER — Ambulatory Visit (INDEPENDENT_AMBULATORY_CARE_PROVIDER_SITE_OTHER): Payer: BLUE CROSS/BLUE SHIELD | Admitting: Internal Medicine

## 2018-11-10 ENCOUNTER — Encounter: Payer: Self-pay | Admitting: Internal Medicine

## 2018-11-10 VITALS — BP 120/84 | HR 80 | Temp 98.7°F | Wt 199.0 lb

## 2018-11-10 DIAGNOSIS — F902 Attention-deficit hyperactivity disorder, combined type: Secondary | ICD-10-CM | POA: Diagnosis not present

## 2018-11-10 DIAGNOSIS — E039 Hypothyroidism, unspecified: Secondary | ICD-10-CM | POA: Diagnosis not present

## 2018-11-10 DIAGNOSIS — R7301 Impaired fasting glucose: Secondary | ICD-10-CM

## 2018-11-10 DIAGNOSIS — Z79899 Other long term (current) drug therapy: Secondary | ICD-10-CM

## 2018-11-10 DIAGNOSIS — E785 Hyperlipidemia, unspecified: Secondary | ICD-10-CM

## 2018-11-10 DIAGNOSIS — G4733 Obstructive sleep apnea (adult) (pediatric): Secondary | ICD-10-CM

## 2018-11-10 LAB — LIPID PANEL
Cholesterol: 190 mg/dL (ref 0–200)
HDL: 52.4 mg/dL (ref >39.00)
LDL Cholesterol: 125 mg/dL — ABNORMAL HIGH (ref 0–99)
NonHDL: 137.93
Total CHOL/HDL Ratio: 4
Triglycerides: 65 mg/dL (ref 0.0–149.0)
VLDL: 13 mg/dL (ref 0.0–40.0)

## 2018-11-10 LAB — TSH: TSH: 2.95 u[IU]/mL (ref 0.35–4.50)

## 2018-11-10 LAB — HEPATIC FUNCTION PANEL
ALT: 25 U/L (ref 0–35)
AST: 19 U/L (ref 0–37)
Albumin: 4.6 g/dL (ref 3.5–5.2)
Alkaline Phosphatase: 65 U/L (ref 39–117)
Bilirubin, Direct: 0.1 mg/dL (ref 0.0–0.3)
Total Bilirubin: 0.5 mg/dL (ref 0.2–1.2)
Total Protein: 6.5 g/dL (ref 6.0–8.3)

## 2018-11-10 LAB — CBC WITH DIFFERENTIAL/PLATELET
Basophils Absolute: 0 10*3/uL (ref 0.0–0.1)
Basophils Relative: 1 % (ref 0.0–3.0)
Eosinophils Absolute: 0.3 10*3/uL (ref 0.0–0.7)
Eosinophils Relative: 6.6 % — ABNORMAL HIGH (ref 0.0–5.0)
HCT: 40.6 % (ref 36.0–46.0)
Hemoglobin: 13.8 g/dL (ref 12.0–15.0)
Lymphocytes Relative: 30.1 % (ref 12.0–46.0)
Lymphs Abs: 1.2 10*3/uL (ref 0.7–4.0)
MCHC: 34 g/dL (ref 30.0–36.0)
MCV: 91.6 fl (ref 78.0–100.0)
Monocytes Absolute: 0.3 10*3/uL (ref 0.1–1.0)
Monocytes Relative: 8.4 % (ref 3.0–12.0)
Neutro Abs: 2.2 10*3/uL (ref 1.4–7.7)
Neutrophils Relative %: 53.9 % (ref 43.0–77.0)
Platelets: 254 10*3/uL (ref 150.0–400.0)
RBC: 4.44 Mil/uL (ref 3.87–5.11)
RDW: 12.7 % (ref 11.5–15.5)
WBC: 4 10*3/uL (ref 4.0–10.5)

## 2018-11-10 LAB — BASIC METABOLIC PANEL
BUN: 16 mg/dL (ref 6–23)
CO2: 28 mEq/L (ref 19–32)
Calcium: 9.6 mg/dL (ref 8.4–10.5)
Chloride: 105 mEq/L (ref 96–112)
Creatinine, Ser: 1.01 mg/dL (ref 0.40–1.20)
GFR: 60.57 mL/min (ref >60.00)
Glucose, Bld: 106 mg/dL — ABNORMAL HIGH (ref 70–99)
Potassium: 4.7 mEq/L (ref 3.5–5.1)
Sodium: 139 mEq/L (ref 135–145)

## 2018-11-10 LAB — HEMOGLOBIN A1C: Hgb A1c MFr Bld: 5.6 % (ref 4.6–6.5)

## 2018-11-10 MED ORDER — AMPHETAMINE-DEXTROAMPHET ER 20 MG PO CP24: 40.0000 mg | ORAL_CAPSULE | Freq: Every day | ORAL | 0 refills | Status: DC

## 2018-11-10 NOTE — Progress Notes (Signed)
Chief Complaint  Patient presents with  . Medication Management    HPI: Victoria Carter 53 y.o. come in for Chronic disease medication  management   ADHD  Taking daily and dec dose on weekend not working .    Still helps some   Mood  On wellbutrin    prozac     Stable enough .   Job still awful but doing  More stable   Counseling on hold .   No time at work but has one    LIPIDS:    Ok meds  THyroid : taking meds   OSA   Not using  cpacp but planning  Well.    Has moved  .  Plans on trying again.   Gi fissure  With gi bleeding   Hx abn lfts and creatinine   Dr  Loreta Ave . This fall  No records easily avail to review    ROS: See pertinent positives and negatives per HPI. No cp sob new sx .   Past Medical History:  Diagnosis Date  . Allergic rhinitis   . Depression   . Hx of abnormal cervical Pap smear    used cryo when first married and pregnant  . Hyperglycemia   . Hyperlipidemia   . Sinusitis   . UTI (lower urinary tract infection)     Family History  Problem Relation Age of Onset  . Atrial fibrillation Mother   . Heart attack Brother        age 22  . Diabetes type II Unknown   . Hyperlipidemia Unknown   . Factor V Leiden deficiency Daughter        Also husband    Social History   Socioeconomic History  . Marital status: Legally Separated    Spouse name: Not on file  . Number of children: Not on file  . Years of education: Not on file  . Highest education level: Not on file  Occupational History  . Not on file  Social Needs  . Financial resource strain: Not on file  . Food insecurity:    Worry: Not on file    Inability: Not on file  . Transportation needs:    Medical: Not on file    Non-medical: Not on file  Tobacco Use  . Smoking status: Former Smoker    Last attempt to quit: 04/12/2009    Years since quitting: 9.5  . Smokeless tobacco: Never Used  Substance and Sexual Activity  . Alcohol use: Yes    Alcohol/week: 1.0 standard drinks    Types:  1 Glasses of wine per week  . Drug use: No  . Sexual activity: Not on file  Lifestyle  . Physical activity:    Days per week: Not on file    Minutes per session: Not on file  . Stress: Not on file  Relationships  . Social connections:    Talks on phone: Not on file    Gets together: Not on file    Attends religious service: Not on file    Active member of club or organization: Not on file    Attends meetings of clubs or organizations: Not on file    Relationship status: Not on file  Other Topics Concern  . 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 acetaminophen (TYLENOL) 500 MG tablet Take 2 tablets (1,000 mg total) by mouth every 6 (six) hours as needed for mild pain or moderate pain. 60 tablet 6  . amphetamine-dextroamphetamine (ADDERALL XR) 20 MG 24 hr capsule Take 2 capsules (40 mg total) by mouth daily. 60 capsule 0  . atorvastatin (LIPITOR) 80 MG tablet TAKE 0.5 TABLETS (40 MG TOTAL) BY MOUTH DAILY. 45 tablet 1  . buPROPion (WELLBUTRIN XL) 150 MG 24 hr tablet Take 2 tablets (300 mg total) by mouth daily. Increase to 300 mg per day after  3 weeks  . (Patient taking differently: Take 300 mg by mouth daily. ) 30 tablet 0  . Calcium 500 MG CHEW 1 chewable tablet daily 30 tablet 6  . FLUoxetine (PROZAC) 20 MG capsule TAKE 2 CAPSULES BY MOUTH EVERY DAY 180 capsule 1  . levothyroxine (SYNTHROID, LEVOTHROID) 50 MCG tablet TAKE 1 TABLET BY MOUTH EVERY DAY 90 tablet 0  . Multiple Vitamins-Minerals ([employer] WOMENS DAILY GUMMIES) CHEW Chew 2 Units by mouth daily. 60 tablet 6  . ranitidine (ZANTAC) 150 MG tablet TAKE 1 TABLET BY MOUTH TWICE A DAY 180 tablet 1  . Acetaminophen (TYLENOL PO) Take 1,000 mg by mouth as needed  for pain.    Marland Kitchen amphetamine-dextroamphetamine (ADDERALL XR) 20 MG 24 hr capsule Take 2 capsules (40 mg total) by mouth daily. [employer]arma 60 capsule 0  . buPROPion (WELLBUTRIN XL) 150 MG 24 hr tablet TAKE 2 TABLETS (300 MG TOTAL) BY MOUTH DAILY. 180 tablet 0   No facility-administered medications prior to visit.      EXAM:  BP 120/84 (BP Location: Left Arm, Patient Position: Sitting, Cuff Size: Large)   Pulse 80   Temp 98.7 F (37.1 C) (Oral)   Wt 199 lb (90.3 kg)   SpO2 97%   BMI 31.73 kg/m   Body mass index is 31.73 kg/m.  GENERAL: vitals reviewed and listed above, alert, oriented, appears well hydrated and in no acute distress  Increase motor activity  More relaxed   Affect better today  HEENT: atraumatic, conjunctiva  clear, no obvious abnormalities on inspection of external nose and ears NECK: no obvious masses on inspection palpation  LUNGS: clear to auscultation bilaterally, no wheezes, rales or rhonchi, good air movement CV: HRRR, no clubbing cyanosis or  peripheral edema nl cap refill  Abdomen:  Sof,t normal bowel sounds without hepatosplenomegaly, no guarding rebound or masses no CVA tenderness MS: moves all extremities without noticeable focal  abnormality PSYCH: pleasant and cooperative,  Lab Results  Component Value Date   WBC 4.0 11/10/2018   HGB 13.8 11/10/2018   HCT 40.6 11/10/2018   PLT 254.0 11/10/2018   GLUCOSE 106 (H) 11/10/2018   CHOL 190 11/10/2018   TRIG 65.0 11/10/2018   HDL 52.40 11/10/2018   LDLDIRECT 88.6 11/01/2014   LDLCALC 125 (H) 11/10/2018   ALT 25 11/10/2018   AST 19 11/10/2018   NA 139 11/10/2018   K 4.7 11/10/2018   CL 105 11/10/2018   CREATININE 1.01 11/10/2018   BUN 16 11/10/2018   CO2 28 11/10/2018   TSH 2.95 11/10/2018   HGBA1C 5.6 11/10/2018   BP Readings from Last 3 Encounters:  11/10/18 120/84  07/10/18 100/77  03/26/18 122/78   Wt Readings from Last 3 Encounters:  11/10/18  199 lb (90.3 kg)  07/10/18 196 lb (88.9  kg)  03/26/18 193 lb 8 oz (87.8 kg)   Lab not totally fasting   Creamer  sweete in drink?  ASSESSMENT AND PLAN:  Discussed the following assessment and plan:  Attention deficit hyperactivity disorder (ADHD), combined type - Plan: Basic metabolic panel, CBC with Differential/Platelet, Hemoglobin A1c, Hepatic function panel, Lipid panel, TSH  Medication management - Plan: Basic metabolic panel, CBC with Differential/Platelet, Hemoglobin A1c, Hepatic function panel, Lipid panel, TSH  Hyperlipidemia, unspecified hyperlipidemia type - Plan: Basic metabolic panel, CBC with Differential/Platelet, Hemoglobin A1c, Hepatic function panel, Lipid panel, TSH  Hypothyroidism, unspecified type - Plan: Basic metabolic panel, CBC with Differential/Platelet, Hemoglobin A1c, Hepatic function panel, Lipid panel, TSH  OSA (obstructive sleep apnea) - Plan: Basic metabolic panel, CBC with Differential/Platelet, Hemoglobin A1c, Hepatic function panel, Lipid panel, TSH  Fasting hyperglycemia - Plan: Basic metabolic panel, CBC with Differential/Platelet, Hemoglobin A1c, Hepatic function panel, Lipid panel, TSH Reported abn lfts   From dr Loreta Ave  Will repeat today  Had creamer  In coffee?   Need copy of evaluation  She has stopped nsaids cause of this   Need to get osa rx as sleep issues can cause adhd sx  Seems to be coping better with separation divorcing issue  Although job situation is less than optimum .   adderall printed this time  Advised check with pharmacy first for stock and then notify us for refill to send in as opposed to printing every time ( less efficient).  -Patient advised to return or notify health care team  if  new concerns arise.  Patient Instructions  Get   Copy of  Office notes and lab tests from September.    Lab today  Will eet you know results   rx the sleep apnea   May help attention .   If all ok then 4- 6 months .  Neta Mends. Panosh M.D.

## 2018-11-10 NOTE — Patient Instructions (Addendum)
Get   Copy of  Office notes and lab tests from September.    Lab today  Will eet you know results   rx the sleep apnea   May help attention .   If all ok then 4- 6 months .

## 2018-11-12 DIAGNOSIS — D485 Neoplasm of uncertain behavior of skin: Secondary | ICD-10-CM | POA: Diagnosis not present

## 2018-11-12 DIAGNOSIS — D2262 Melanocytic nevi of left upper limb, including shoulder: Secondary | ICD-10-CM | POA: Diagnosis not present

## 2018-11-14 ENCOUNTER — Other Ambulatory Visit: Payer: Self-pay | Admitting: Internal Medicine

## 2018-11-28 DIAGNOSIS — G4733 Obstructive sleep apnea (adult) (pediatric): Secondary | ICD-10-CM | POA: Diagnosis not present

## 2018-12-02 ENCOUNTER — Other Ambulatory Visit: Payer: Self-pay | Admitting: Internal Medicine

## 2018-12-02 DIAGNOSIS — M1711 Unilateral primary osteoarthritis, right knee: Secondary | ICD-10-CM | POA: Diagnosis not present

## 2018-12-02 NOTE — Telephone Encounter (Signed)
Copied from CRM [id number]. Topic: Quick Communication - Rx Refill/Question >> Dec 02, 2018 12:20 PM Tamela OddiHarris, Brenda J wrote: Medication: buPROPion (WELLBUTRIN XL) 150 MG 24 hr tablet  Patient called to request a refill for the above medication  Preferred Pharmacy (with phone number or street name): [id number] [place],  - [address] [phone] (Phone) [phone] (Fax)

## 2018-12-12 ENCOUNTER — Other Ambulatory Visit: Payer: Self-pay | Admitting: Internal Medicine

## 2018-12-12 NOTE — Telephone Encounter (Signed)
Copied from CRM [id number]. Topic: Quick Communication - Rx Refill/Question >> Dec 12, 2018  2:58 PM Lyn HollingsheadAlexander, [facility] Hospitals[facility] L wrote: Medication: amphetamine-dextroamphetamine (ADDERALL XR) 20 MG 24 hr capsule  Has the patient contacted their pharmacy? Yes - states they can't request for her (Agent: If no, request that the patient contact the pharmacy for the refill.) (Agent: If yes, when and what did the pharmacy advise?)  Preferred Pharmacy (with phone number or street name): [id number] [place], [place] - [address] [phone] (Phone) [phone] (Fax)  Agent: Please be advised that RX refills may take up to 3 business days. We ask that you follow-up with your pharmacy.

## 2018-12-16 NOTE — Telephone Encounter (Signed)
Last OV/Med check and refill 11/10/18 Please advise Dr Clent RidgesFry if you are okay with refilling this 1 month supply in Dr Fabian SharpPanosh Absence. Thanks.

## 2018-12-17 NOTE — Telephone Encounter (Signed)
Patient called to follow up on previous request. 

## 2018-12-18 MED ORDER — AMPHETAMINE-DEXTROAMPHET ER 20 MG PO CP24: 20.0000 mg | ORAL_CAPSULE | Freq: Two times a day (BID) | ORAL | 0 refills | Status: DC

## 2018-12-19 ENCOUNTER — Ambulatory Visit (INDEPENDENT_AMBULATORY_CARE_PROVIDER_SITE_OTHER): Payer: BLUE CROSS/BLUE SHIELD | Admitting: Adult Health

## 2018-12-19 ENCOUNTER — Encounter: Payer: Self-pay | Admitting: Adult Health

## 2018-12-19 DIAGNOSIS — G4733 Obstructive sleep apnea (adult) (pediatric): Secondary | ICD-10-CM

## 2018-12-19 NOTE — Progress Notes (Signed)
@Patient  ID: Victoria Carter, female    DOB: July 20, 1965, 53 y.o.   MRN: [id number]  Chief Complaint  Patient presents with  . Follow-up    OSA    Referring provider: Madelin Headings, MD  HPI: 53 year old female followed for moderate obstructive sleep apnea She is a Air cabin crew for [employer]  TEST/EVENTS :  Sleep study 10/2015 >AHI 18/hr - wt 202 CPAP titration 12/2015 >optimal pressure 15cm H20   12/19/2018 Follow up : OSA  Patient presents for a one-year follow-up.  Patient has underlying sleep apnea.  She is on CPAP at bedtime.  Patient says she is doing okay but has had a lot of stress over the last year.  She is undergoing a divorce after being married 32 years.  Says she is had a lot of anxiety and issues with sleep.  She says she is getting better.  She is recently restarted using her CPAP this past week.  She has not been wearing it for a while due to just lots of stress at home. Discussed the importance of CPAP usage.  Patient education on sleep apnea potential complications of untreated sleep apnea.  Encouraged on a healthy sleep regimen  She does have attention deficit disorder.  Is on Adderall.  Which she says helps her to concentrate.  Especially at work.  This is monitored by her primary care physician  No Known Allergies  Immunization History  Administered Date(s) Administered  . Influenza Split 10/11/2011, 09/11/2012  . Influenza Whole 10/30/2007, 10/01/2008, 09/21/2010  . Influenza,inj,Quad PF,6+ Mos 09/16/2013, 08/27/2014, 10/18/2016, 09/11/2017  . Influenza-Unspecified 09/19/2015, 10/08/2018  . Td 05/31/2002  . Tdap 06/11/2012  . Zoster Recombinat (Shingrix) 07/11/2017, 09/11/2017    Past Medical History:  Diagnosis Date  . Allergic rhinitis   . Depression   . Hx of abnormal cervical Pap smear    used cryo when first married and pregnant  . Hyperglycemia   . Hyperlipidemia   . Sinusitis   . UTI (lower urinary tract infection)     Tobacco  History: Social History   Tobacco Use  Smoking Status Former Smoker  . Packs/day: 0.50  . Years: 10.00  . Pack years: 5.00  . Last attempt to quit: 04/12/2009  . Years since quitting: 9.6  Smokeless Tobacco Never Used   Counseling [place]: Not Answered   Outpatient Medications Prior to Visit  Medication Sig Dispense Refill  . acetaminophen (TYLENOL) 500 MG tablet Take 2 tablets (1,000 mg total) by mouth every 6 (six) hours as needed for mild pain or moderate pain. 60 tablet 6  . amphetamine-dextroamphetamine (ADDERALL XR) 20 MG 24 hr capsule Take 2 capsules (40 mg total) by mouth daily. [employer] 60 capsule 0  . amphetamine-dextroamphetamine (ADDERALL XR) 20 MG 24 hr capsule Take 1 capsule (20 mg total) by mouth 2 (two) times daily. Take 2 capsules (40 mg total) by mouth daily. 60 capsule 0  . atorvastatin (LIPITOR) 80 MG tablet TAKE 0.5 TABLETS (40 MG TOTAL) BY MOUTH DAILY. 45 tablet 1  . buPROPion (WELLBUTRIN XL) 150 MG 24 hr tablet Take 2 tablets (300 mg total) by mouth daily. Increase to 300 mg per day after  3 weeks  . (Patient taking differently: Take 300 mg by mouth daily. ) 30 tablet 0  . buPROPion (WELLBUTRIN XL) 150 MG 24 hr tablet TAKE 2 TABLETS (300 MG TOTAL) BY MOUTH DAILY. 180 tablet 0  . Calcium 500 MG CHEW 1 chewable tablet daily 30 tablet 6  .  FLUoxetine (PROZAC) 20 MG capsule TAKE 2 CAPSULES BY MOUTH EVERY DAY 180 capsule 0  . levothyroxine (SYNTHROID, LEVOTHROID) 50 MCG tablet TAKE 1 TABLET BY MOUTH EVERY DAY 90 tablet 0  . Multiple Vitamins-Minerals ([employer] WOMENS DAILY GUMMIES) CHEW Chew 2 Units by mouth daily. 60 tablet 6  . ranitidine (ZANTAC) 150 MG tablet TAKE 1 TABLET BY MOUTH TWICE A DAY 180 tablet 1   No facility-administered medications prior to visit.      Review of Systems  Constitutional:   No  weight loss, night sweats,  Fevers, chills, fatigue, or  lassitude.  HEENT:   No headaches,  Difficulty swallowing,  Tooth/dental problems, or  Sore throat,                 No sneezing, itching, ear ache, nasal congestion, post nasal drip,   CV:  No chest pain,  Orthopnea, PND, swelling in lower extremities, anasarca, dizziness, palpitations, syncope.   GI  No heartburn, indigestion, abdominal pain, nausea, vomiting, diarrhea, change in bowel habits, loss of appetite, bloody stools.   Resp: No shortness of breath with exertion or at rest.  No excess mucus, no productive cough,  No non-productive cough,  No coughing up of blood.  No change in color of mucus.  No wheezing.  No chest wall deformity  Skin: no rash or lesions.  GU: no dysuria, change in color of urine, no urgency or frequency.  No flank pain, no hematuria   MS:  No joint pain or swelling.  No decreased range of motion.  No back pain.    Physical Exam  BP 104/76 (BP Location: Left Arm, Cuff Size: Normal)   Pulse (!) 58   Ht 5\' 7"  (1.702 m)   Wt 206 lb (93.4 kg)   SpO2 98%   BMI 32.26 kg/m   GEN: A/Ox3; pleasant , NAD, well nourished    HEENT:  [place]/AT,  EACs-clear, TMs-wnl, NOSE-clear, THROAT-clear, no lesions, no postnasal drip or exudate noted.   NECK:  Supple w/ fair ROM; no JVD; normal carotid impulses w/o bruits; no thyromegaly or nodules palpated; no lymphadenopathy.    RESP  Clear  P & A; w/o, wheezes/ rales/ or rhonchi. no accessory muscle use, no dullness to percussion  CARD:  RRR, no m/r/g, no peripheral edema, pulses intact, no cyanosis or clubbing.  GI:   Soft & nt; nml bowel sounds; no organomegaly or masses detected.   Musco: Warm bil, no deformities or joint swelling noted.   Neuro: alert, no focal deficits noted.    Skin: Warm, no lesions or rashes    Lab Results:  CBC    Component Value Date/Time   WBC 4.0 11/10/2018 0916   RBC 4.44 11/10/2018 0916   HGB 13.8 11/10/2018 0916   HCT 40.6 11/10/2018 0916   PLT 254.0 11/10/2018 0916   MCV 91.6 11/10/2018 0916   MCHC 34.0 11/10/2018 0916   RDW 12.7 11/10/2018 0916   LYMPHSABS 1.2 11/10/2018  0916   MONOABS 0.3 11/10/2018 0916   EOSABS 0.3 11/10/2018 0916   BASOSABS 0.0 11/10/2018 0916    BMET    Component Value Date/Time   NA 139 11/10/2018 0916   NA 140 10/29/2017   K 4.7 11/10/2018 0916   CL 105 11/10/2018 0916   CO2 28 11/10/2018 0916   GLUCOSE 106 (H) 11/10/2018 0916   BUN 16 11/10/2018 0916   CREATININE 1.01 11/10/2018 0916   CALCIUM 9.6 11/10/2018 0916   GFRNONAA 71.70 01/30/2010  [id number]   GFRAA 87 12/15/2008 0930    BNP No results found for: BNP  ProBNP No results found for: PROBNP  Imaging: No results found.    No flowsheet data found.  No results found for: NITRICOXIDE      Assessment & Plan:   OSA (obstructive sleep apnea) Moderate OSA-encouraged on usage and compliance. Patient education [place] on sleep apnea and CPAP usage.  Plan Patient Instructions  Continue on CPAP at bedtime Goal is to get in 4 to 6 hours each night. Work on healthy weight Do not drive a sleepy May use Saline nasal spray Twice daily  As needed   May try Saline nasal gel At bedtime  As needed   Follow up with Dr. Vassie LollAlva  In 6 months and As needed       Obesity Healthy weight loss.      Rubye Oaksammy Parrett, NP 12/19/2018

## 2018-12-19 NOTE — Assessment & Plan Note (Signed)
Healthy weight loss 

## 2018-12-19 NOTE — Patient Instructions (Addendum)
Continue on CPAP at bedtime Goal is to get in 4 to 6 hours each night. Work on healthy weight Do not drive a sleepy May use Saline nasal spray Twice daily  As needed   May try Saline nasal gel At bedtime  As needed   Follow up with Dr. Vassie LollAlva  In 6 months and As needed

## 2018-12-19 NOTE — Assessment & Plan Note (Signed)
Moderate OSA-encouraged on usage and compliance. Patient education Sunday on sleep apnea and CPAP usage.  Plan Patient Instructions  Continue on CPAP at bedtime Goal is to get in 4 to 6 hours each night. Work on healthy weight Do not drive a sleepy May use Saline nasal spray Twice daily  As needed   May try Saline nasal gel At bedtime  As needed   Follow up with Dr. Vassie LollAlva  In 6 months and As needed

## 2018-12-30 ENCOUNTER — Other Ambulatory Visit: Payer: Self-pay | Admitting: Internal Medicine

## 2018-12-30 DIAGNOSIS — G4733 Obstructive sleep apnea (adult) (pediatric): Secondary | ICD-10-CM | POA: Diagnosis not present

## 2019-01-06 ENCOUNTER — Other Ambulatory Visit: Payer: Self-pay | Admitting: Internal Medicine

## 2019-01-12 ENCOUNTER — Telehealth: Payer: Self-pay | Admitting: Internal Medicine

## 2019-01-12 NOTE — Telephone Encounter (Signed)
Copied from CRM [id number]. Topic: Quick Communication - See Telephone Encounter >> Jan 12, 2019 12:40 PM Windy KalataMichael, Taylor L, NT wrote: CRM for notification. See Telephone encounter for: 01/12/19.  Patient is requesting a refill on amphetamine-dextroamphetamine (ADDERALL XR) 20 MG 24 hr capsule.  [id number] [place], [place] - [address] [place] [id number] Phone: [phone] Fax: [phone]

## 2019-01-12 NOTE — Telephone Encounter (Signed)
Last filled:12/18/18 Last OV:11/10/18

## 2019-01-13 MED ORDER — AMPHETAMINE-DEXTROAMPHET ER 20 MG PO CP24: 20.0000 mg | ORAL_CAPSULE | Freq: Two times a day (BID) | ORAL | 0 refills | Status: DC

## 2019-01-13 NOTE — Telephone Encounter (Signed)
Sent in electronically .  

## 2019-01-20 NOTE — Telephone Encounter (Signed)
Patient called in and stated her insurance is refusing to fill med for twice a day states it can be filled for once a day directions.

## 2019-01-20 NOTE — Telephone Encounter (Signed)
Please advise Dr Fabian Sharp, thanks.  Rx for BID dose denied

## 2019-01-22 ENCOUNTER — Telehealth: Payer: Self-pay

## 2019-01-22 NOTE — Telephone Encounter (Signed)
There is no 40 mg but there is a 30 mg   Advise OV if discussing /prescribing other medications optinos  If she asks her insurance plan which meds are preferred for ADHD that would help  Also coupons on line for such as vyvanse

## 2019-01-22 NOTE — Telephone Encounter (Signed)
Copied from CRM [id number]. Topic: General - Other >> Jan 22, 2019 10:58 AM Jaquita Rector A wrote: Reason for CRM: Patient called to say that her insurance no longer cover her amphetamine-dextroamphetamine (ADDERALL XR) 20 MG 24 hr capsule for her to take 2 a day it only cover for one a day. She want to know if there is possibly a 40mg  that may be can be sent to the pharmacy. Or possibly a different medication with similar benefits. Please advise. Ph# [phone]

## 2019-01-22 NOTE — Telephone Encounter (Signed)
Please advise Dr Panosh, thanks.   

## 2019-01-23 MED ORDER — AMPHETAMINE-DEXTROAMPHET ER 30 MG PO CP24: 30.0000 mg | ORAL_CAPSULE | ORAL | 0 refills | Status: DC

## 2019-01-23 NOTE — Telephone Encounter (Signed)
Pt aware that Adderall 30mg  has been sent to the pharmacy.  Nothing further needed.

## 2019-01-23 NOTE — Telephone Encounter (Signed)
Pt is wanting to know if you 30mg  could be called until her appt OR 20mg  tabs but enough to take 2 daily. ** She can only #30 of the 20mg  for a 30 day period so if taking BID then she would only have 15 days worth which is why she is wanting to increase the dose.   Pt only has a few pills left of the 20mg  tabs and does not want to go without.   Pt is scheduled for Tuesday 01/27/2019.  Please advise Dr Fabian Sharp, thanks.

## 2019-01-23 NOTE — Telephone Encounter (Signed)
Lets try the 30 mg  Until she gets here

## 2019-01-26 NOTE — Progress Notes (Signed)
Chief Complaint  Patient presents with  . Follow-up    Pt would like to talk about adderall doses due to insurance     HPI: Dione HousekeeperKaren Discepoli 54 y.o. come in for med eval  Insurance issues  About  Formulary  And QL for her meds  Cost  Trying one day so far for 30 mg xr   adderall  vyvanse  Cost  concerta er [facility]be at [facility]  Other  Not optimum.  40 mg xr seems to be working for her  But  [facility] says QL ( 70+ plus more )    ROS: See pertinent positives and negatives per HPI.  Past Medical History:  Diagnosis Date  . Allergic rhinitis   . Depression   . Hx of abnormal cervical Pap smear    used cryo when first married and pregnant  . Hyperglycemia   . Hyperlipidemia   . Sinusitis   . UTI (lower urinary tract infection)     Family History  Problem Relation Age of Onset  . Atrial fibrillation Mother   . Heart attack Brother        age 55  . Diabetes type II Unknown   . Hyperlipidemia Unknown   . Factor V Leiden deficiency Daughter        Also husband    Social History   Socioeconomic History  . Marital status: Legally Separated    Spouse name: Not on file  . Number of children: Not on file  . Years of education: Not on file  . Highest education level: Not on file  Occupational History  . Not on file  Social Needs  . Financial resource strain: Not on file  . Food insecurity:    Worry: Not on file    Inability: Not on file  . Transportation needs:    Medical: Not on file    Non-medical: Not on file  Tobacco Use  . Smoking status: Former Smoker    Packs/day: 0.50    Years: 10.00    Pack years: 5.00    Last attempt to quit: 04/12/2009    Years since quitting: 9.8  . Smokeless tobacco: Never Used  Substance and Sexual Activity  . Alcohol use: Yes    Alcohol/week: 1.0 standard drinks    Types: 1 Glasses of wine per week  . Drug use: No  . Sexual activity: Not on file  Lifestyle  . Physical activity:    Days per week: Not on file   Minutes per session: Not on file  . Stress: Not on file  Relationships  . Social connections:    Talks on phone: Not on file    Gets together: Not on file    Attends religious service: Not on file    Active member of club or organization: Not on file    Attends meetings of clubs or organizations: Not on file    Relationship status: Not on file  Other Topics Concern  . Not on file  Social History Narrative   Occupation: 40 per week 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  [employer]  In stress position    Married now separated  2018    Regular exercise- yes  yoga   HH of 1-  girls home from college   Pet dogs passed away 2017   G2P2    Outpatient Medications Prior to Visit  Medication Sig Dispense Refill  . acetaminophen (TYLENOL) 500 MG tablet Take 2 tablets (1,000 mg  total) by mouth every 6 (six) hours as needed for mild pain or moderate pain. 60 tablet 6  . amphetamine-dextroamphetamine (ADDERALL XR) 20 MG 24 hr capsule Take 2 capsules (40 mg total) by mouth daily. [employer] 60 capsule 0  . amphetamine-dextroamphetamine (ADDERALL XR) 20 MG 24 hr capsule Take 1 capsule (20 mg total) by mouth 2 (two) times daily for 30 days. Take 2 capsules (40 mg total) by mouth daily. 60 capsule 0  . amphetamine-dextroamphetamine (ADDERALL XR) 30 MG 24 hr capsule Take 1 capsule (30 mg total) by mouth every morning. [employer] 30 capsule 0  . atorvastatin (LIPITOR) 80 MG tablet TAKE 0.5 TABLETS (40 MG TOTAL) BY MOUTH DAILY. 45 tablet 1  . buPROPion (WELLBUTRIN XL) 150 MG 24 hr tablet Take 2 tablets (300 mg total) by mouth daily. Increase to 300 mg per day after  3 weeks  . (Patient taking differently: Take 300 mg by mouth daily. ) 30 tablet 0  . buPROPion (WELLBUTRIN XL) 150 MG 24 hr tablet TAKE 2 TABLETS (300 MG TOTAL) BY MOUTH DAILY. 180 tablet 0  . Calcium 500 MG CHEW 1 chewable tablet daily 30 tablet 6  . FLUoxetine (PROZAC) 20 MG capsule TAKE 2 CAPSULES BY MOUTH EVERY DAY 180 capsule 0  . levothyroxine (SYNTHROID,  LEVOTHROID) 50 MCG tablet TAKE 1 TABLET BY MOUTH EVERY DAY 90 tablet 1  . Multiple Vitamins-Minerals ([facility] W[facility]ENS DAILY GUMMIES) CHEW Chew 2 Units by mouth daily. 60 tablet 6  . ranitidine (ZANTAC) 150 MG tablet TAKE 1 TABLET BY MOUTH TWICE A DAY 180 tablet 1   No facility-administered medications prior to visit.      EXAM:  BP 122/78 (BP Location: Right Arm, Patient Position: Sitting, Cuff Size: Normal)   Pulse 96   Temp 98.1 F (36.7 C) (Oral)   Wt 205 lb 11.2 oz (93.3 kg)   SpO2 97%   BMI 32.22 kg/m   Body mass index is 32.22 kg/m.  GENERAL: vitals reviewed and listed above, alert, oriented, appears well hydrated and in no acute distress PSYCH: pleasant and cooperative, no obvious depression or anxiety motor activity   BP Readings from Last 3 Encounters:  01/27/19 122/78  12/19/18 104/76  11/10/18 120/84    ASSESSMENT AND PLAN:  Discussed the following assessment and plan:  Attention deficit hyperactivity disorder (ADHD), combined type  Medication management Insurance now  Not paying  For 40 mg per day and other options even worse for t affordability . Trying 30 xr  Consider good rx if needed  For this.  -Patient advised to return or notify health care team  if  new concerns arise.  Patient Instructions  Look into  Good rx  Coupons.    Let us know  How the 30 mg is doing and then if we want to go back to 2 20 mg  concerta er   Generic is a totally different medication  But may be worth a try in the future .  Neta Mends. Panosh M.D.

## 2019-01-27 ENCOUNTER — Encounter: Payer: Self-pay | Admitting: Internal Medicine

## 2019-01-27 ENCOUNTER — Ambulatory Visit (INDEPENDENT_AMBULATORY_CARE_PROVIDER_SITE_OTHER): Payer: 59 | Admitting: Internal Medicine

## 2019-01-27 VITALS — BP 122/78 | HR 96 | Temp 98.1°F | Wt 205.7 lb

## 2019-01-27 DIAGNOSIS — F902 Attention-deficit hyperactivity disorder, combined type: Secondary | ICD-10-CM | POA: Diagnosis not present

## 2019-01-27 DIAGNOSIS — Z79899 Other long term (current) drug therapy: Secondary | ICD-10-CM | POA: Diagnosis not present

## 2019-01-27 NOTE — Patient Instructions (Addendum)
Look into  Good rx  Coupons.    Let us know  How the 30 mg is doing and then if we want to go back to 2 20 mg  concerta er   Generic is a totally different medication  But may be worth a try in the future .

## 2019-02-09 ENCOUNTER — Other Ambulatory Visit: Payer: Self-pay | Admitting: Internal Medicine

## 2019-02-23 ENCOUNTER — Telehealth: Payer: Self-pay | Admitting: Internal Medicine

## 2019-02-23 NOTE — Telephone Encounter (Signed)
Copied from CRM [id number]. Topic: Quick Communication - Rx Refill/Question >> Feb 23, 2019  9:59 AM Baldo Daub L wrote: Medication: amphetamine-dextroamphetamine (ADDERALL XR) 30 MG 24 hr capsule  Pt states she wants this RX called in since insurance will not cover two 20mg  capsules.  Pt states that the 30mg  does wear off faster but it's better than not being covered.  Has the patient contacted their pharmacy? No - needs script changed back (Agent: If no, request that the patient contact the pharmacy for the refill.) (Agent: If yes, when and what did the pharmacy advise?)  Preferred Pharmacy (with phone number or street name): [id number] [place], [place] - [address] [phone] (Phone) [phone] (Fax)  Agent: Please be advised that RX refills may take up to 3 business days. We ask that you follow-up with your pharmacy.

## 2019-02-24 MED ORDER — AMPHETAMINE-DEXTROAMPHET ER 30 MG PO CP24: 30.0000 mg | ORAL_CAPSULE | ORAL | 0 refills | Status: DC

## 2019-02-24 NOTE — Telephone Encounter (Signed)
Sent in electronically .  

## 2019-03-23 ENCOUNTER — Other Ambulatory Visit: Payer: Self-pay | Admitting: Internal Medicine

## 2019-03-25 ENCOUNTER — Telehealth: Payer: Self-pay | Admitting: Internal Medicine

## 2019-03-25 NOTE — Telephone Encounter (Signed)
Pt called requesting refills for two rxs. First is for amphetamine 30mg  to be sent to [facility] [address]. Next, is for ranitidine 150mg  [facility]  Pt states she wanted rx for ranitidine [facility] under her new last name. If pt has not updated her insurance card with new last name then they will not file it under it. Attempted to reach pt to inform her no answer. Left vm to call back

## 2019-03-25 NOTE — Telephone Encounter (Signed)
If pt has "new" last name we need updated information.

## 2019-03-27 ENCOUNTER — Other Ambulatory Visit: Payer: Self-pay

## 2019-03-27 MED ORDER — AMPHETAMINE-DEXTROAMPHET ER 30 MG PO CP24: 30.0000 mg | ORAL_CAPSULE | ORAL | 0 refills | Status: DC

## 2019-03-27 MED ORDER — RANITIDINE HCL 150 MG PO TABS: 150.0000 mg | ORAL_TABLET | Freq: Two times a day (BID) | ORAL | 1 refills | Status: DC

## 2019-03-27 NOTE — Telephone Encounter (Signed)
Refills has been routed to Dr.Panosh

## 2019-03-27 NOTE — Telephone Encounter (Signed)
Sent in electronically .  

## 2019-03-27 NOTE — Telephone Encounter (Signed)
Last filled:Adderall:02/24/2019 Ranitidine:01/05/2019 Last OV:01/27/2019 Upcoming appt: none at this time

## 2019-04-22 ENCOUNTER — Telehealth: Payer: Self-pay | Admitting: Internal Medicine

## 2019-04-22 NOTE — Telephone Encounter (Signed)
Copied from CRM [id number]. Topic: Quick Communication - Rx Refill/Question >> Apr 22, 2019  4:23 PM Angela Nevin wrote: Medication: amphetamine-dextroamphetamine (ADDERALL XR) 30 MG 24 hr capsule  Patient is requesting a refill of this medication.   Preferred Pharmacy (with phone number or street name):[id number] [address], [place] - [address] [phone] (Phone) [phone] (Fax)

## 2019-04-24 MED ORDER — AMPHETAMINE-DEXTROAMPHET ER 30 MG PO CP24: 30.0000 mg | ORAL_CAPSULE | ORAL | 0 refills | Status: DC

## 2019-04-24 NOTE — Telephone Encounter (Signed)
Sent in electronically .  

## 2019-04-29 ENCOUNTER — Other Ambulatory Visit: Payer: Self-pay | Admitting: Internal Medicine

## 2019-04-30 ENCOUNTER — Other Ambulatory Visit: Payer: Self-pay | Admitting: Internal Medicine

## 2019-05-12 ENCOUNTER — Other Ambulatory Visit: Payer: Self-pay

## 2019-05-22 ENCOUNTER — Encounter: Payer: Self-pay | Admitting: Internal Medicine

## 2019-05-28 ENCOUNTER — Telehealth: Payer: Self-pay | Admitting: Internal Medicine

## 2019-05-28 MED ORDER — AMPHETAMINE-DEXTROAMPHET ER 30 MG PO CP24: 30.0000 mg | ORAL_CAPSULE | ORAL | 0 refills | Status: DC

## 2019-05-28 NOTE — Telephone Encounter (Signed)
Copied from CRM [id number]. Topic: General - Other >> May 28, 2019  9:19 AM Leafy Ro wrote: Reason for CRM: pt is calling and needs refill on generic adderall xr 30 mg. Pt would like [employer] brand. [facility] [address]

## 2019-05-28 NOTE — Telephone Encounter (Signed)
Sent in electronically .  plee make her appt   In about a month before next refill    Ov or virtual for med check

## 2019-05-31 ENCOUNTER — Other Ambulatory Visit: Payer: Self-pay | Admitting: Internal Medicine

## 2019-06-25 ENCOUNTER — Other Ambulatory Visit: Payer: Self-pay | Admitting: Internal Medicine

## 2019-06-30 ENCOUNTER — Telehealth: Payer: Self-pay | Admitting: Internal Medicine

## 2019-06-30 NOTE — Telephone Encounter (Signed)
Medication Refill - Medication: amphetamine-dextroamphetamine (ADDERALL XR) 30 MG 24 hr capsule   Has the patient contacted their pharmacy? Yes.   (Agent: If no, request that the patient contact the pharmacy for the refill.) (Agent: If yes, when and what did the pharmacy advise?)  Preferred Pharmacy (with phone number or street name):  [id number] [address], [place] [phone] (Phone) [phone] (Fax)     Agent: Please be advised that RX refills may take up to 3 business days. We ask that you follow-up with your pharmacy.

## 2019-07-01 ENCOUNTER — Other Ambulatory Visit: Payer: Self-pay

## 2019-07-01 ENCOUNTER — Encounter: Payer: Self-pay | Admitting: Internal Medicine

## 2019-07-01 ENCOUNTER — Ambulatory Visit (INDEPENDENT_AMBULATORY_CARE_PROVIDER_SITE_OTHER): Payer: 59 | Admitting: Internal Medicine

## 2019-07-01 DIAGNOSIS — Z79899 Other long term (current) drug therapy: Secondary | ICD-10-CM | POA: Diagnosis not present

## 2019-07-01 DIAGNOSIS — F902 Attention-deficit hyperactivity disorder, combined type: Secondary | ICD-10-CM | POA: Diagnosis not present

## 2019-07-01 DIAGNOSIS — F4323 Adjustment disorder with mixed anxiety and depressed mood: Secondary | ICD-10-CM | POA: Diagnosis not present

## 2019-07-01 MED ORDER — AMPHETAMINE-DEXTROAMPHET ER 30 MG PO CP24: 30.0000 mg | ORAL_CAPSULE | ORAL | 0 refills | Status: DC

## 2019-07-01 NOTE — Progress Notes (Signed)
Virtual Visit via Video Note  I connected with@ on 07/01/19 at  3:30 PM EDT by a video enabled telemedicine application and verified that I am speaking with the correct person using two identifiers. Location patient: home Location provider:workoffice Persons participating in the virtual visit: patient, provider  [place] national recommendations  regarding COVID 19 pandemic   video visit is advised over in office visit for this patient.  Patient aware  of the limitations of evaluation and management by telemedicine and  availability of in person appointments. and agreed to proceed.   HPI: Victoria Carter presents for video visit  For med check  adder allxr   30 mg per day off sometimes weekend . 40 mg per day works best but insurance  Only covers 30 per month so  Taking less effective mg dosing but doing ok . Mood doing much better   Working from home and coping fine asks about dec the Wellbutrin  Dose to 150.   has trochanteric bursitis and inj and lodine bid still hurts  Dr Luiz BlareGraves is ortho.  Work from home 8 hours plus  Pet dog 3 yo   ROS: See pertinent positives and negatives per HPI.  Past Medical History:  Diagnosis Date  . Allergic rhinitis   . Depression   . Hx of abnormal cervical Pap smear    used cryo when first married and pregnant  . Hyperglycemia   . Hyperlipidemia   . Sinusitis   . UTI (lower urinary tract infection)     Past Surgical History:  Procedure Laterality Date  . BREAST BIOPSY  1987  . BREAST EXCISIONAL BIOPSY Right   . CERVICAL BIOPSY    . CESAREAN SECTION      Family History  Problem Relation Age of Onset  . Atrial fibrillation Mother   . Heart attack Brother        age 55  . Diabetes type II Unknown   . Hyperlipidemia Unknown   . Factor V Leiden deficiency Daughter        Also husband    Social History   Tobacco Use  . Smoking status: Former Smoker    Packs/day: 0.50    Years: 10.00    Pack years: 5.00    Quit date: 04/12/2009    Years  since quitting: 10.2  . Smokeless tobacco: Never Used  Substance Use Topics  . Alcohol use: Yes    Alcohol/week: 1.0 standard drinks    Types: 1 Glasses of wine per week  . Drug use: No      Current Outpatient Medications:  .  acetaminophen (TYLENOL) 500 MG tablet, Take 2 tablets (1,000 mg total) by mouth every 6 (six) hours as needed for mild pain or moderate pain., Disp: 60 tablet, Rfl: 6 .  amphetamine-dextroamphetamine (ADDERALL XR) 30 MG 24 hr capsule, Take 1 capsule (30 mg total) by mouth every morning. [employer], Disp: 30 capsule, Rfl: 0 .  atorvastatin (LIPITOR) 80 MG tablet, TAKE 0.5 TABLETS (40 MG TOTAL) BY MOUTH DAILY., Disp: 45 tablet, Rfl: 1 .  buPROPion (WELLBUTRIN XL) 150 MG 24 hr tablet, Take 2 tablets (300 mg total) by mouth daily. Increase to 300 mg per day after  3 weeks  . (Patient taking differently: Take 300 mg by mouth daily. ), Disp: 30 tablet, Rfl: 0 .  buPROPion (WELLBUTRIN XL) 150 MG 24 hr tablet, TAKE 2 TABLETS (300 MG TOTAL) BY MOUTH DAILY., Disp: 60 tablet, Rfl: 2 .  Calcium 500 MG CHEW,  1 chewable tablet daily, Disp: 30 tablet, Rfl: 6 .  FLUoxetine (PROZAC) 20 MG capsule, TAKE 2 CAPSULES BY MOUTH EVERY DAY, Disp: 180 capsule, Rfl: 1 .  levothyroxine (SYNTHROID, LEVOTHROID) 50 MCG tablet, TAKE 1 TABLET BY MOUTH EVERY DAY, Disp: 90 tablet, Rfl: 1 .  Multiple Vitamins-Minerals ([employer] WOMENS DAILY GUMMIES) CHEW, Chew 2 Units by mouth daily., Disp: 60 tablet, Rfl: 6 .  ranitidine (ZANTAC) 150 MG tablet, Take 1 tablet (150 mg total) by mouth 2 (two) times daily., Disp: 180 tablet, Rfl: 1  EXAM: BP Readings from Last 3 Encounters:  01/27/19 122/78  12/19/18 104/76  11/10/18 120/84    VITALS per patient if applicable:  GENERAL: alert, oriented, appears well and in no acute distress  HEENT: atraumatic, conjunttiva clear, no obvious abnormalities on inspection of external nose and ears NECK: normal movements of the head and neck LUNGS: on inspection no  signs of respiratory distress, breathing rate appears normal, no obvious gross SOB, gasping or wheezing CV: no obvious cyanosis MS: moves all visible extremities without noticeable abnormality PSYCH/NEURO: pleasant and cooperative, no obvious depression or anxiety, speech and thought processing grossly intact Lab Results  Component Value Date   WBC 4.0 11/10/2018   HGB 13.8 11/10/2018   HCT 40.6 11/10/2018   PLT 254.0 11/10/2018   GLUCOSE 106 (H) 11/10/2018   CHOL 190 11/10/2018   TRIG 65.0 11/10/2018   HDL 52.40 11/10/2018   LDLDIRECT 88.6 11/01/2014   LDLCALC 125 (H) 11/10/2018   ALT 25 11/10/2018   AST 19 11/10/2018   NA 139 11/10/2018   K 4.7 11/10/2018   CL 105 11/10/2018   CREATININE 1.01 11/10/2018   BUN 16 11/10/2018   CO2 28 11/10/2018   TSH 2.95 11/10/2018   HGBA1C 5.6 11/10/2018    ASSESSMENT AND PLAN:  Discussed the following assessment and plan:    ICD-10-CM   1. Attention deficit hyperactivity disorder (ADHD), combined type  F90.2   2. Medication management  Z79.899   3. Adjustment reaction with anxiety and depression improved and remission  F43.23    ok to wean wellbutrin   Doing quite well mood wise  And ok to dec to 150 Wellbutrin  And after a month off if doing well.  Benefit more than risk of medications  to continue.   Counseled.  Refill adderall xr 30 disp 30 today   Expectant management and discussion of plan and treatment with opportunity to ask questions and all were answered. The patient agreed with the plan and demonstrated an understanding of the instructions.  plan flu shot in fall and  cpx and labs  In nov December   Advised to call back or seek an in-person evaluation if worsening  or having  further concerns .  Shanon Ace, MD

## 2019-07-02 ENCOUNTER — Other Ambulatory Visit: Payer: Self-pay | Admitting: Internal Medicine

## 2019-07-08 NOTE — Telephone Encounter (Signed)
Pt had virtual visit.

## 2019-07-14 ENCOUNTER — Encounter: Payer: Self-pay | Admitting: Infectious Diseases

## 2019-07-24 ENCOUNTER — Encounter: Payer: Self-pay | Admitting: Internal Medicine

## 2019-07-24 ENCOUNTER — Other Ambulatory Visit: Payer: Self-pay

## 2019-07-24 ENCOUNTER — Ambulatory Visit (INDEPENDENT_AMBULATORY_CARE_PROVIDER_SITE_OTHER): Payer: 59 | Admitting: Internal Medicine

## 2019-07-24 DIAGNOSIS — G4733 Obstructive sleep apnea (adult) (pediatric): Secondary | ICD-10-CM

## 2019-07-24 DIAGNOSIS — Z79899 Other long term (current) drug therapy: Secondary | ICD-10-CM | POA: Diagnosis not present

## 2019-07-24 DIAGNOSIS — E039 Hypothyroidism, unspecified: Secondary | ICD-10-CM

## 2019-07-24 DIAGNOSIS — E785 Hyperlipidemia, unspecified: Secondary | ICD-10-CM

## 2019-07-24 DIAGNOSIS — F902 Attention-deficit hyperactivity disorder, combined type: Secondary | ICD-10-CM

## 2019-07-24 DIAGNOSIS — R7301 Impaired fasting glucose: Secondary | ICD-10-CM

## 2019-07-24 DIAGNOSIS — M1611 Unilateral primary osteoarthritis, right hip: Secondary | ICD-10-CM | POA: Diagnosis not present

## 2019-07-24 DIAGNOSIS — Z01818 Encounter for other preprocedural examination: Secondary | ICD-10-CM | POA: Diagnosis not present

## 2019-07-24 NOTE — Progress Notes (Signed)
Virtual Visit via Video Note  I connected with@ on 07/24/19 at  1:45 PM EDT by a video enabled telemedicine application and verified that I am speaking with the correct person using two identifiers. Location patient: home Location provider:work  office Persons participating in the virtual visit: patient, provider  [place] national recommendations  regarding COVID 19 pandemic   video visit is advised over in office visit for this patient.  Patient aware  of the limitations of evaluation and management by telemedicine and  availability of in person appointments. and agreed to proceed.   HPI: Victoria Carter presents for video visit  For pre op evaluation and clearance. She is to have Right THA Out patient  With  anterior approach on August 19th Dr Luiz BlareGraves.  Had MRI  that showed mod to severe OA plus poss other findings .   Has had no significant bleeding  neurologic or cardiovascular sx in recent times.  Victoria Carter has gone up with hips difficulties and COVID situation She uses CPAP managed by [facility] . She takes thyroid medication , ADHD meds and  Atorvastatin for lipid management    ROS: See pertinent positives and negatives per HPI. No cp sob  Cough sob.   Past Medical History:  Diagnosis Date  . Allergic rhinitis   . Depression   . Hx of abnormal cervical Pap smear    used cryo when first married and pregnant  . Hyperglycemia   . Hyperlipidemia   . Sinusitis   . UTI (lower urinary tract infection)     Past Surgical History:  Procedure Laterality Date  . BREAST BIOPSY  1987  . BREAST EXCISIONAL BIOPSY Right   . CERVICAL BIOPSY    . CESAREAN SECTION      Family History  Problem Relation Age of Onset  . Atrial fibrillation Mother   . Heart attack Brother        age 55  . Diabetes type II Unknown   . Hyperlipidemia Unknown   . Factor V Leiden deficiency Daughter        Also husband    Social History   Tobacco Use  . Smoking status: Former Smoker    Packs/day:  0.50    Years: 10.00    Pack years: 5.00    Quit date: 04/12/2009    Years since quitting: 10.2  . Smokeless tobacco: Never Used  Substance Use Topics  . Alcohol use: Yes    Alcohol/week: 1.0 standard drinks    Types: 1 Glasses of wine per week  . Drug use: No      Current Outpatient Medications:  .  acetaminophen (TYLENOL) 500 MG tablet, Take 2 tablets (1,000 mg total) by mouth every 6 (six) hours as needed for mild pain or moderate pain., Disp: 60 tablet, Rfl: 6 .  amphetamine-dextroamphetamine (ADDERALL XR) 30 MG 24 hr capsule, Take 1 capsule (30 mg total) by mouth every morning. [employer], Disp: 30 capsule, Rfl: 0 .  atorvastatin (LIPITOR) 80 MG tablet, TAKE 0.5 TABLETS (40 MG TOTAL) BY MOUTH DAILY., Disp: 45 tablet, Rfl: 1 .  buPROPion (WELLBUTRIN XL) 150 MG 24 hr tablet, Take 2 tablets (300 mg total) by mouth daily. Increase to 300 mg per day after  3 weeks  . (Patient taking differently: Take 300 mg by mouth daily. ), Disp: 30 tablet, Rfl: 0 .  buPROPion (WELLBUTRIN XL) 150 MG 24 hr tablet, TAKE 2 TABLETS (300 MG TOTAL) BY MOUTH DAILY., Disp: 60 tablet, Rfl: 2 .  Calcium 500 MG CHEW, 1 chewable tablet daily, Disp: 30 tablet, Rfl: 6 .  FLUoxetine (PROZAC) 20 MG capsule, TAKE 2 CAPSULES BY MOUTH EVERY DAY, Disp: 180 capsule, Rfl: 1 .  levothyroxine (SYNTHROID) 50 MCG tablet, TAKE 1 TABLET BY MOUTH EVERY DAY, Disp: 30 tablet, Rfl: 5 .  Multiple Vitamins-Minerals ([facility] WOMENS DAILY GUMMIES) CHEW, Chew 2 Units by mouth daily., Disp: 60 tablet, Rfl: 6 .  ranitidine (ZANTAC) 150 MG tablet, Take 1 tablet (150 mg total) by mouth 2 (two) times daily., Disp: 180 tablet, Rfl: 1  EXAM: BP Readings from Last 3 Encounters:  01/27/19 122/78  12/19/18 104/76  11/10/18 120/84    VITALS per patient if applicable: says weight is up to 200   GENERAL: alert, oriented, appears well and in no acute distress HEENT: atraumatic, conjunttiva clear, no obvious abnormalities on inspection of  external nose and ears NECK: normal movements of the head and neck LUNGS: on inspection no signs of respiratory distress, breathing rate appears normal, no obvious gross SOB, gasping or wheezing CV: no obvious cyanosis PSYCH/NEURO: pleasant and cooperative, no obvious depression or anxiety, speech and thought processing grossly intact   ASSESSMENT AND PLAN:  Discussed the following assessment and plan:    ICD-10-CM   1. Arthritis of right hip  J19.41 Basic metabolic panel    CBC with Differential/Platelet    Hemoglobin A1c    Hepatic function panel    Lipid panel    TSH    Protime-INR  2. Pre-op evaluation  D40.814 Basic metabolic panel    CBC with Differential/Platelet    Hemoglobin A1c    Hepatic function panel    Lipid panel    TSH    Protime-INR   appropriate risk candidate for surgery  "cleared"  3. Medication management  G81.856 Basic metabolic panel    CBC with Differential/Platelet    Hemoglobin A1c    Hepatic function panel    Lipid panel    TSH    Protime-INR  4. Hyperlipidemia, unspecified hyperlipidemia type  D14.9 Basic metabolic panel    CBC with Differential/Platelet    Hemoglobin A1c    Hepatic function panel    Lipid panel    TSH    Protime-INR  5. OSA (obstructive sleep apnea)  F02.63 Basic metabolic panel    CBC with Differential/Platelet    Hemoglobin A1c    Hepatic function panel    Lipid panel    TSH    Protime-INR  6. Hypothyroidism, unspecified type  Z85.8 Basic metabolic panel    CBC with Differential/Platelet    Hemoglobin A1c    Hepatic function panel    Lipid panel    TSH    Protime-INR  7. Attention deficit hyperactivity disorder (ADHD), combined type  I50.2 Basic metabolic panel    CBC with Differential/Platelet    Hemoglobin A1c    Hepatic function panel    Lipid panel    TSH    Protime-INR  8. Fasting hyperglycemia  D74.12 Basic metabolic panel    CBC with Differential/Platelet    Hemoglobin A1c    Hepatic function panel     Lipid panel    TSH    Protime-INR   Plan lab as requested plus lipids thyroid  For monitoring  Counseled.   Weight management and  Self care .   Expectant management and discussion of plan and treatment with opportunity to ask questions and all were answered. The patient agreed with the plan and demonstrated  an understanding of the instructions.  will send  Form and lab results to Dr Luiz BlareGraves office when completed.  Advised to call back or seek an in-person evaluation if having  further concerns .   Berniece AndreasWanda Panosh, MD

## 2019-07-30 ENCOUNTER — Other Ambulatory Visit (INDEPENDENT_AMBULATORY_CARE_PROVIDER_SITE_OTHER): Payer: 59

## 2019-07-30 ENCOUNTER — Telehealth: Payer: Self-pay | Admitting: Internal Medicine

## 2019-07-30 ENCOUNTER — Other Ambulatory Visit: Payer: Self-pay

## 2019-07-30 DIAGNOSIS — E785 Hyperlipidemia, unspecified: Secondary | ICD-10-CM

## 2019-07-30 DIAGNOSIS — G4733 Obstructive sleep apnea (adult) (pediatric): Secondary | ICD-10-CM

## 2019-07-30 DIAGNOSIS — F902 Attention-deficit hyperactivity disorder, combined type: Secondary | ICD-10-CM

## 2019-07-30 DIAGNOSIS — Z79899 Other long term (current) drug therapy: Secondary | ICD-10-CM

## 2019-07-30 DIAGNOSIS — R7301 Impaired fasting glucose: Secondary | ICD-10-CM

## 2019-07-30 DIAGNOSIS — Z01818 Encounter for other preprocedural examination: Secondary | ICD-10-CM | POA: Diagnosis not present

## 2019-07-30 DIAGNOSIS — E039 Hypothyroidism, unspecified: Secondary | ICD-10-CM

## 2019-07-30 DIAGNOSIS — M1611 Unilateral primary osteoarthritis, right hip: Secondary | ICD-10-CM

## 2019-07-30 LAB — BASIC METABOLIC PANEL
BUN: 18 mg/dL (ref 6–23)
CO2: 26 mEq/L (ref 19–32)
Calcium: 9.6 mg/dL (ref 8.4–10.5)
Chloride: 106 mEq/L (ref 96–112)
Creatinine, Ser: 1 mg/dL (ref 0.40–1.20)
GFR: 57.5 mL/min — ABNORMAL LOW (ref >60.00)
Glucose, Bld: 114 mg/dL — ABNORMAL HIGH (ref 70–99)
Potassium: 4.3 mEq/L (ref 3.5–5.1)
Sodium: 141 mEq/L (ref 135–145)

## 2019-07-30 LAB — CBC WITH DIFFERENTIAL/PLATELET
Basophils Absolute: 0 10*3/uL (ref 0.0–0.1)
Basophils Relative: 0.3 % (ref 0.0–3.0)
Eosinophils Absolute: 0.2 10*3/uL (ref 0.0–0.7)
Eosinophils Relative: 3.6 % (ref 0.0–5.0)
HCT: 42 % (ref 36.0–46.0)
Hemoglobin: 13.9 g/dL (ref 12.0–15.0)
Lymphocytes Relative: 21.5 % (ref 12.0–46.0)
Lymphs Abs: 1.2 10*3/uL (ref 0.7–4.0)
MCHC: 33.1 g/dL (ref 30.0–36.0)
MCV: 92.2 fl (ref 78.0–100.0)
Monocytes Absolute: 0.4 10*3/uL (ref 0.1–1.0)
Monocytes Relative: 6.8 % (ref 3.0–12.0)
Neutro Abs: 3.9 10*3/uL (ref 1.4–7.7)
Neutrophils Relative %: 67.8 % (ref 43.0–77.0)
Platelets: 274 10*3/uL (ref 150.0–400.0)
RBC: 4.56 Mil/uL (ref 3.87–5.11)
RDW: 13.3 % (ref 11.5–15.5)
WBC: 5.8 10*3/uL (ref 4.0–10.5)

## 2019-07-30 LAB — HEMOGLOBIN A1C: Hgb A1c MFr Bld: 5.7 % (ref 4.6–6.5)

## 2019-07-30 LAB — HEPATIC FUNCTION PANEL
ALT: 24 U/L (ref 0–35)
AST: 20 U/L (ref 0–37)
Albumin: 4.7 g/dL (ref 3.5–5.2)
Alkaline Phosphatase: 81 U/L (ref 39–117)
Bilirubin, Direct: 0.1 mg/dL (ref 0.0–0.3)
Total Bilirubin: 0.4 mg/dL (ref 0.2–1.2)
Total Protein: 6.6 g/dL (ref 6.0–8.3)

## 2019-07-30 LAB — LIPID PANEL
Cholesterol: 198 mg/dL (ref 0–200)
HDL: 58.6 mg/dL (ref >39.00)
LDL Cholesterol: 121 mg/dL — ABNORMAL HIGH (ref 0–99)
NonHDL: 138.91
Total CHOL/HDL Ratio: 3
Triglycerides: 88 mg/dL (ref 0.0–149.0)
VLDL: 17.6 mg/dL (ref 0.0–40.0)

## 2019-07-30 LAB — TSH: TSH: 3.35 u[IU]/mL (ref 0.35–4.50)

## 2019-07-30 LAB — PROTIME-INR
INR: 0.9 ratio (ref 0.8–1.0)
Prothrombin Time: 10.7 s (ref 9.6–13.1)

## 2019-07-30 NOTE — Telephone Encounter (Signed)
The patient dropped off MRI Results

## 2019-07-30 NOTE — Telephone Encounter (Signed)
Will place in folder for panosh to review

## 2019-08-05 NOTE — Telephone Encounter (Signed)
In envelope in green folder

## 2019-08-05 NOTE — Telephone Encounter (Signed)
I didn't see the report in the folder

## 2019-09-11 ENCOUNTER — Other Ambulatory Visit: Payer: Self-pay | Admitting: Internal Medicine

## 2019-09-11 MED ORDER — AMPHETAMINE-DEXTROAMPHET ER 30 MG PO CP24: 30.0000 mg | ORAL_CAPSULE | ORAL | 0 refills | Status: DC

## 2019-09-11 NOTE — Telephone Encounter (Signed)
RX REFILL amphetamine-dextroamphetamine [facility]  PHARMACY [id number] [address], [place] (Phone) [phone] (Fa

## 2019-09-11 NOTE — Telephone Encounter (Signed)
RF request for Adderall LOV: 07/24/19 Next ov: n/a Last written: 07/01/19 (30,0) Last CSC: 08/10/16 w/ no UDS  Please advise, thanks. Medication pending

## 2019-10-05 ENCOUNTER — Other Ambulatory Visit: Payer: Self-pay | Admitting: Internal Medicine

## 2019-10-09 ENCOUNTER — Other Ambulatory Visit: Payer: Self-pay | Admitting: Internal Medicine

## 2019-10-09 MED ORDER — AMPHETAMINE-DEXTROAMPHET ER 30 MG PO CP24: 30.0000 mg | ORAL_CAPSULE | ORAL | 0 refills | Status: DC

## 2019-10-09 NOTE — Telephone Encounter (Signed)
Copied from [place] [id number]. Topic: Quick Communication - Rx Refill/Question >> Oct 09, 2019 12:18 PM Izola Price, [place] A wrote: Medication: amphetamine-dextroamphetamine (ADDERALL XR) 30 MG 24 hr capsule  Has the patient contacted their pharmacy? Yes (Agent: If no, request that the patient contact the pharmacy for the refill.) (Agent: If yes, when and what did the pharmacy advise?)Contact PCP  Preferred Pharmacy (with phone number or street name): [id number] [address], [place] [phone] (Phone) [phone] (Fax)    Agent: Please be advised that RX refills may take up to 3 business days. We ask that you follow-up with your pharmacy.

## 2019-10-09 NOTE — Telephone Encounter (Signed)
Requested medication (s) are due for refill today: yes  Requested medication (s) are on the active medication list: yes  Last refill:  09/11/2019  Future visit scheduled: no  Notes to clinic:  Refill cannot be delegated    Requested Prescriptions  Pending Prescriptions Disp Refills   amphetamine-dextroamphetamine (ADDERALL XR) 30 MG 24 hr capsule 30 capsule 0    Sig: Take 1 capsule (30 mg total) by mouth every morning. [employer]     Not Delegated - Psychiatry:  Stimulants/ADHD Failed - 10/09/2019 12:22 PM      Failed - This refill cannot be delegated      Failed - Urine Drug Screen completed in last 360 days.      Passed - Valid encounter within last 3 months    Recent Outpatient Visits          2 months ago Arthritis of right hip   Therapist, music at [facility], Standley Brooking, MD   3 months ago Attention deficit hyperactivity disorder (ADHD), combined type   Therapist, music at [place], [place]   8 months ago Attention deficit hyperactivity disorder (ADHD), combined type   Therapist, music at [facility], Standley Brooking, MD   11 months ago Attention deficit hyperactivity disorder (ADHD), combined type   Therapist, music at [facility], Standley Brooking, MD   1 year ago Thumb pain, right   Therapist, music at [facility], Standley Brooking, MD

## 2019-10-09 NOTE — Telephone Encounter (Signed)
Last ov:07/24/2019 Last filled:09/11/2019

## 2019-10-12 ENCOUNTER — Other Ambulatory Visit: Payer: Self-pay | Admitting: Internal Medicine

## 2019-10-12 DIAGNOSIS — Z1231 Encounter for screening mammogram for malignant neoplasm of breast: Secondary | ICD-10-CM

## 2019-11-16 ENCOUNTER — Other Ambulatory Visit: Payer: Self-pay | Admitting: Internal Medicine

## 2019-11-16 NOTE — Telephone Encounter (Signed)
Copied from [place] [id number]. Topic: Quick Communication - Rx Refill/Question >> Nov 16, 2019 12:05 PM Rainey Pines A wrote: Medication: amphetamine-dextroamphetamine (ADDERALL XR) 30 MG 24 hr capsule   Has the patient contacted their pharmacy? Yes (Agent: If no, request that the patient contact the pharmacy for the refill.) (Agent: If yes, when and what did the pharmacy advise?)Contact PCP  Preferred Pharmacy (with phone number or street name): [id number] [place], [place] [address] [phone] (Phone) [phone] (Fax)    Agent: Please be advised that RX refills may take up to 3 business days. We ask that you follow-up with your pharmacy.

## 2019-11-16 NOTE — Telephone Encounter (Signed)
Requested medication (s) are due for refill today: yes  Requested medication (s) are on the active medication list: yes  Last refill:  10/09/2019  Future visit scheduled: no  Notes to clinic:  Refill cannot be delegated    Requested Prescriptions  Pending Prescriptions Disp Refills   amphetamine-dextroamphetamine (ADDERALL XR) 30 MG 24 hr capsule 30 capsule 0    Sig: Take 1 capsule (30 mg total) by mouth every morning. [employer]     Not Delegated - Psychiatry:  Stimulants/ADHD Failed - 11/16/2019 12:28 PM      Failed - This refill cannot be delegated      Failed - Urine Drug Screen completed in last 360 days.      Failed - Valid encounter within last 3 months    Recent Outpatient Visits          3 months ago Arthritis of right hip   [place] [facility], Standley Brooking, MD   4 months ago Attention deficit hyperactivity disorder (ADHD), combined type   [facility] [place], [place]   9 months ago Attention deficit hyperactivity disorder (ADHD), combined type   [facility], Standley Brooking, MD   1 year ago Attention deficit hyperactivity disorder (ADHD), combined type   [facility], Standley Brooking, MD   1 year ago Thumb pain, right   [facility], Standley Brooking, MD

## 2019-11-16 NOTE — Telephone Encounter (Signed)
Last ov:07/24/2019 Last filled:10/09/2019

## 2019-11-17 MED ORDER — AMPHETAMINE-DEXTROAMPHET ER 30 MG PO CP24: 30.0000 mg | ORAL_CAPSULE | ORAL | 0 refills | Status: DC

## 2019-11-30 ENCOUNTER — Ambulatory Visit
Admission: RE | Admit: 2019-11-30 | Discharge: 2019-11-30 | Disposition: A | Discharge status: Home / Self Care | Payer: 59 | Source: Ambulatory Visit | Attending: Internal Medicine | Admitting: Internal Medicine

## 2019-11-30 ENCOUNTER — Other Ambulatory Visit: Payer: Self-pay

## 2019-11-30 DIAGNOSIS — Z1231 Encounter for screening mammogram for malignant neoplasm of breast: Secondary | ICD-10-CM

## 2019-11-30 IMAGING — MG DIGITAL SCREENING BILAT W/ TOMO W/ CAD
8 series · 8 of 24 positions shown · non-contrast
Comparison: Previous exam(s).

CLINICAL DATA: Screening.

EXAM:
DIGITAL SCREENING BILATERAL MAMMOGRAM WITH TOMO AND CAD

[R CC synth-2D]
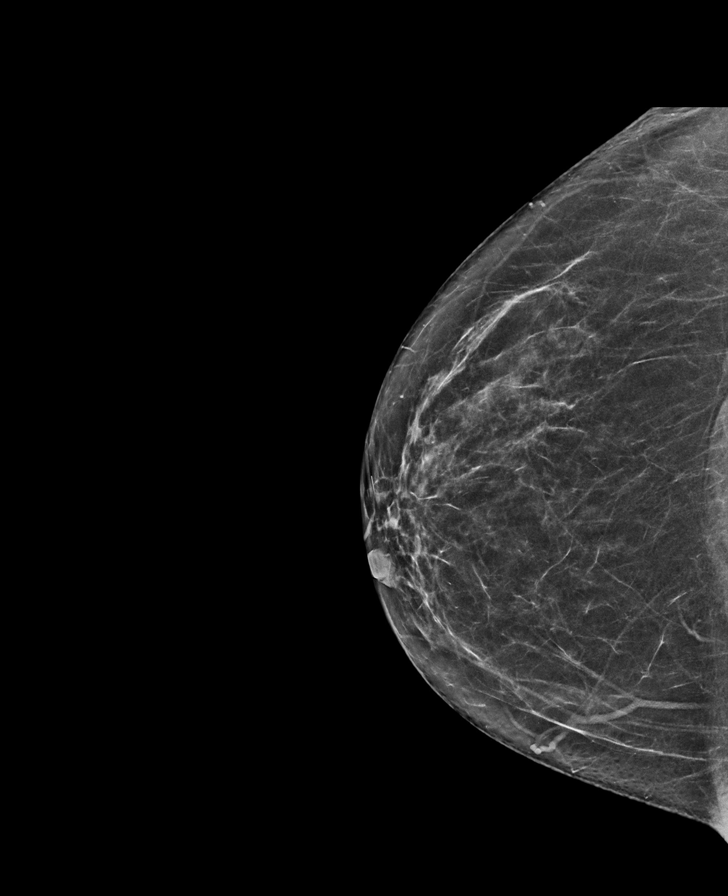

[L MLO synth-2D]
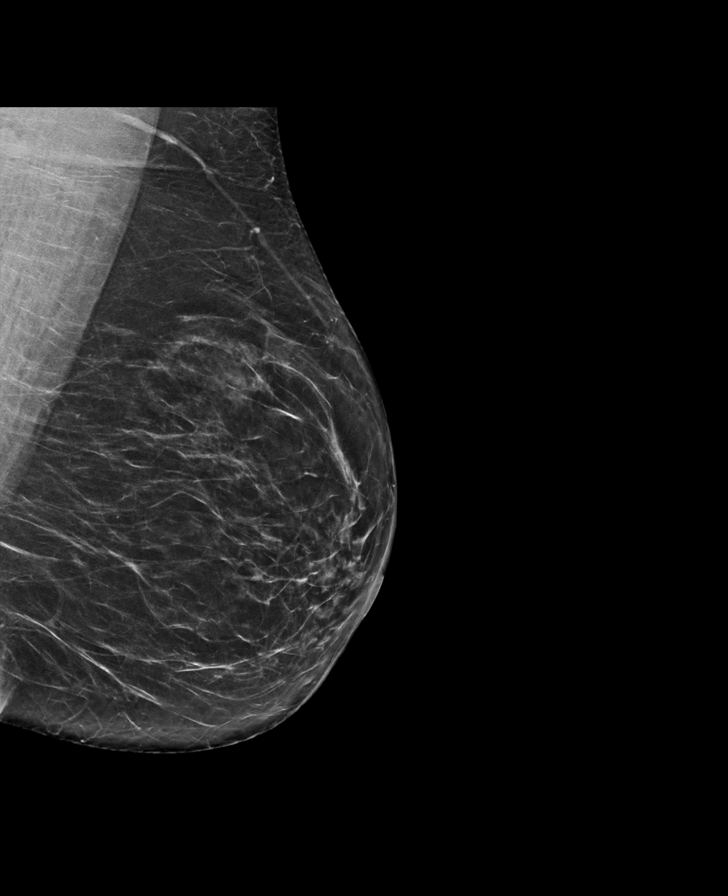

[R MLO synth-2D]
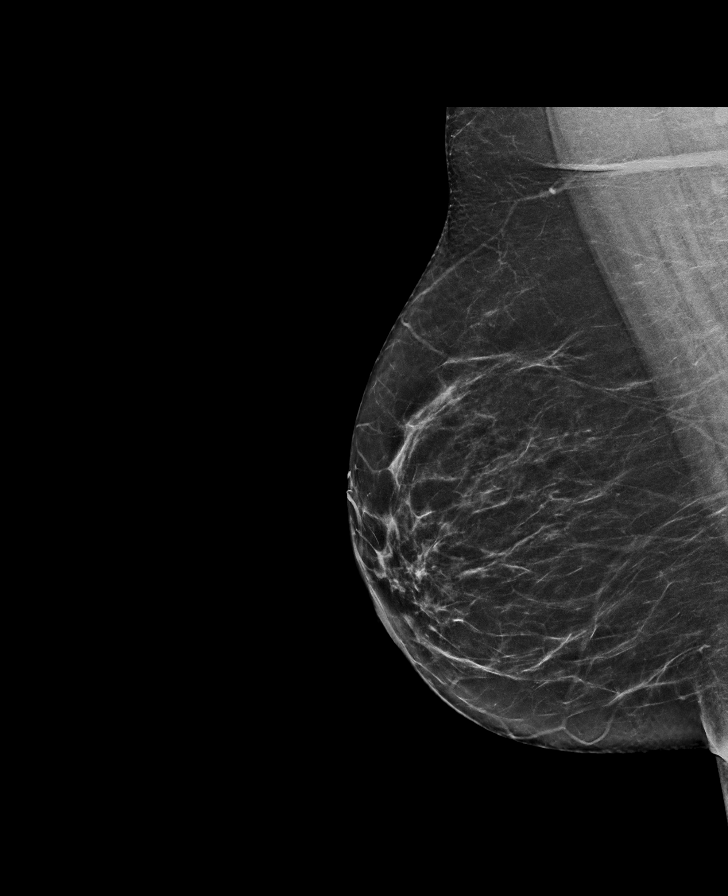

[L CC synth-2D]
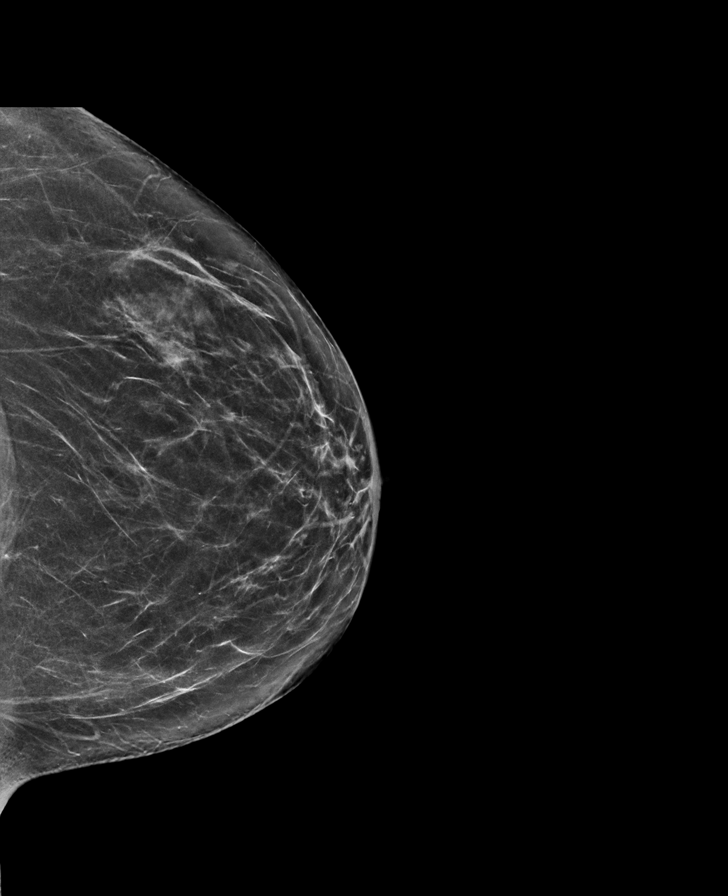

[R CC tomo · tomo slice 33/65.0]
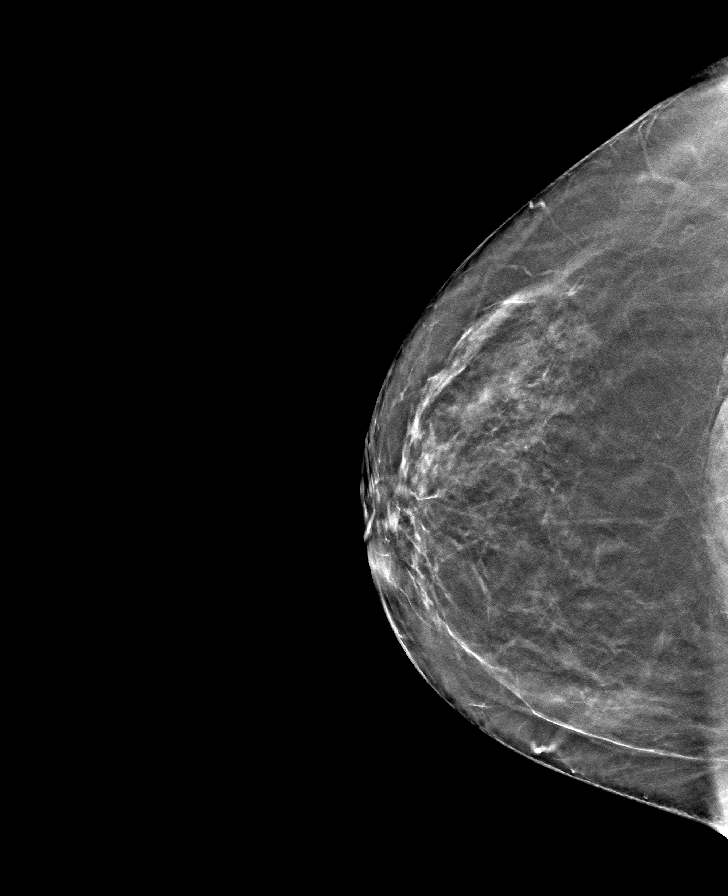

[L CC tomo · tomo slice 33/64.0]
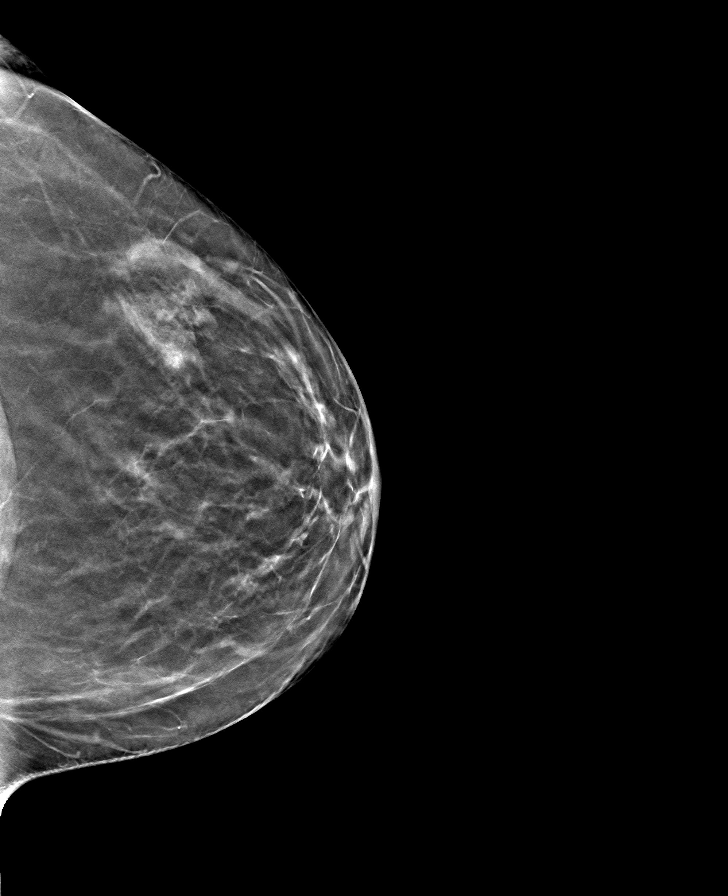

[R MLO tomo · tomo slice 35/70.0]
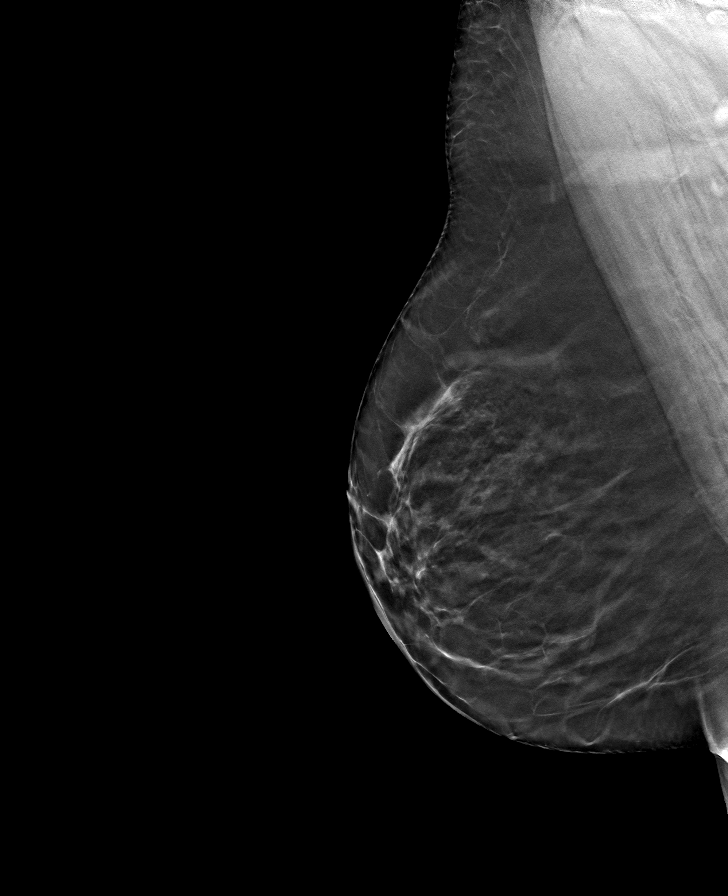

[L MLO tomo · tomo slice 35/69.0]
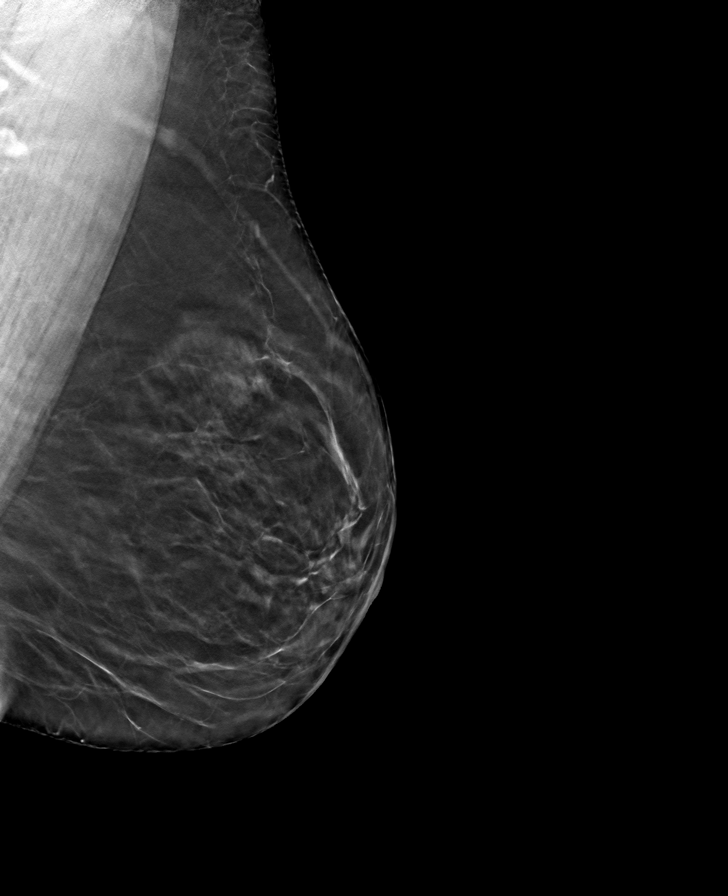

[8 of 24 positions shown; findings below may reference images not displayed]

ACR Breast Density Category b: There are scattered areas of
fibroglandular density.
FINDINGS: There are no findings suspicious for malignancy. Images were
processed with CAD.
IMPRESSION: No mammographic evidence of malignancy. A result letter of this
screening mammogram will be mailed directly to the patient.

RECOMMENDATION:
Screening mammogram in one year. (Code:[id number])

BI-RADS CATEGORY  1: Negative.

## 2019-12-14 ENCOUNTER — Telehealth: Payer: Self-pay | Admitting: Internal Medicine

## 2019-12-14 MED ORDER — AMPHETAMINE-DEXTROAMPHET ER 30 MG PO CP24: 30.0000 mg | ORAL_CAPSULE | ORAL | 0 refills | Status: DC

## 2019-12-14 NOTE — Telephone Encounter (Signed)
Done

## 2019-12-14 NOTE — Telephone Encounter (Signed)
Medication Refill - Medication: amphetamine-dextroamphetamine (ADDERALL XR) 30 MG 24 hr capsule    Preferred Pharmacy (with phone number or street name):  [id number] [place], [place] Phone:  [phone]  Fax:  [phone]       Agent: Please be advised that RX refills may take up to 3 business days. We ask that you follow-up with your pharmacy.

## 2019-12-14 NOTE — Telephone Encounter (Signed)
Last ov:07/24/2019 Last filled:11/17/2019 Please advise in absence of Dr.panosh

## 2019-12-18 ENCOUNTER — Other Ambulatory Visit: Payer: Self-pay | Admitting: Internal Medicine

## 2020-01-07 ENCOUNTER — Other Ambulatory Visit: Payer: Self-pay | Admitting: Internal Medicine

## 2020-01-22 ENCOUNTER — Other Ambulatory Visit: Payer: Self-pay | Admitting: Internal Medicine

## 2020-01-22 NOTE — Telephone Encounter (Signed)
Pt called in regards to needing a refill on her Adderall . She is going to a new pharmacy [facility] on [address] Fax: [phone]  )  Pt can be contacted at [phone]

## 2020-01-25 ENCOUNTER — Telehealth: Payer: Self-pay | Admitting: Internal Medicine

## 2020-01-25 DIAGNOSIS — R7301 Impaired fasting glucose: Secondary | ICD-10-CM

## 2020-01-25 DIAGNOSIS — Z79899 Other long term (current) drug therapy: Secondary | ICD-10-CM

## 2020-01-25 DIAGNOSIS — E785 Hyperlipidemia, unspecified: Secondary | ICD-10-CM

## 2020-01-25 DIAGNOSIS — F902 Attention-deficit hyperactivity disorder, combined type: Secondary | ICD-10-CM

## 2020-01-25 DIAGNOSIS — R7989 Other specified abnormal findings of blood chemistry: Secondary | ICD-10-CM

## 2020-01-25 DIAGNOSIS — R7303 Prediabetes: Secondary | ICD-10-CM

## 2020-01-25 MED ORDER — AMPHETAMINE-DEXTROAMPHET ER 30 MG PO CP24: 30.0000 mg | ORAL_CAPSULE | ORAL | 0 refills | Status: DC

## 2020-01-25 NOTE — Telephone Encounter (Signed)
Pt scheduled her CPE and stated that she usually does the lab a week prior to the appt so Panosh can go over them with her. Pt would like to know if that will remain the same or if everything will be done on her appt scheduled for 03/11/20

## 2020-01-25 NOTE — Telephone Encounter (Signed)
Last ov:07/24/2019 Last filled : 12/14/2019 Upcoming :02/16/2020

## 2020-01-25 NOTE — Telephone Encounter (Signed)
Sent pt mychart letting pt know lab orders have been placed

## 2020-01-25 NOTE — Telephone Encounter (Signed)
Please advise 

## 2020-01-25 NOTE — Telephone Encounter (Signed)
I placed future orders  For lab   She can get pre visit     Is possible

## 2020-02-03 MED ORDER — ATORVASTATIN CALCIUM 80 MG PO TABS: 40.0000 mg | ORAL_TABLET | Freq: Every day | ORAL | 5 refills | Status: DC

## 2020-02-03 MED ORDER — LEVOTHYROXINE SODIUM 50 MCG PO TABS: 50.0000 ug | ORAL_TABLET | Freq: Every day | ORAL | 5 refills | Status: DC

## 2020-02-16 ENCOUNTER — Ambulatory Visit: Payer: 59 | Admitting: Internal Medicine

## 2020-02-29 ENCOUNTER — Telehealth: Payer: Self-pay | Admitting: Internal Medicine

## 2020-02-29 NOTE — Telephone Encounter (Signed)
Pt is reqyesting to go back to the 40mg   adderall

## 2020-02-29 NOTE — Telephone Encounter (Signed)
Medication Refill: generic Adderall [facility]acy: [facility] [place]  Phone:   Patient would like a call back to go over which Adderall she is requesting.

## 2020-02-29 NOTE — Telephone Encounter (Signed)
Last ov:07/24/2019 Last filled:01/25/20 Upcoming :03/11/20

## 2020-03-01 MED ORDER — AMPHETAMINE-DEXTROAMPHET ER 20 MG PO CP24: 40.0000 mg | ORAL_CAPSULE | Freq: Every day | ORAL | 0 refills | Status: DC

## 2020-03-01 NOTE — Telephone Encounter (Signed)
Sending equivaelent of previous rx   To [facility] as requested

## 2020-03-02 ENCOUNTER — Other Ambulatory Visit: Payer: Self-pay

## 2020-03-03 ENCOUNTER — Other Ambulatory Visit (INDEPENDENT_AMBULATORY_CARE_PROVIDER_SITE_OTHER): Payer: No Typology Code available for payment source

## 2020-03-03 DIAGNOSIS — R7989 Other specified abnormal findings of blood chemistry: Secondary | ICD-10-CM

## 2020-03-03 DIAGNOSIS — E785 Hyperlipidemia, unspecified: Secondary | ICD-10-CM | POA: Diagnosis not present

## 2020-03-03 DIAGNOSIS — R7303 Prediabetes: Secondary | ICD-10-CM

## 2020-03-03 DIAGNOSIS — F902 Attention-deficit hyperactivity disorder, combined type: Secondary | ICD-10-CM

## 2020-03-03 DIAGNOSIS — Z79899 Other long term (current) drug therapy: Secondary | ICD-10-CM

## 2020-03-03 DIAGNOSIS — R7301 Impaired fasting glucose: Secondary | ICD-10-CM | POA: Diagnosis not present

## 2020-03-03 LAB — BASIC METABOLIC PANEL
BUN: 11 mg/dL (ref 6–23)
CO2: 29 mEq/L (ref 19–32)
Calcium: 9.8 mg/dL (ref 8.4–10.5)
Chloride: 102 mEq/L (ref 96–112)
Creatinine, Ser: 1 mg/dL (ref 0.40–1.20)
GFR: 57.37 mL/min — ABNORMAL LOW (ref >60.00)
Glucose, Bld: 114 mg/dL — ABNORMAL HIGH (ref 70–99)
Potassium: 4.3 mEq/L (ref 3.5–5.1)
Sodium: 139 mEq/L (ref 135–145)

## 2020-03-03 LAB — HEPATIC FUNCTION PANEL
ALT: 23 U/L (ref 0–35)
AST: 16 U/L (ref 0–37)
Albumin: 4.4 g/dL (ref 3.5–5.2)
Alkaline Phosphatase: 78 U/L (ref 39–117)
Bilirubin, Direct: 0.1 mg/dL (ref 0.0–0.3)
Total Bilirubin: 0.6 mg/dL (ref 0.2–1.2)
Total Protein: 6.7 g/dL (ref 6.0–8.3)

## 2020-03-03 LAB — TSH: TSH: 4.98 u[IU]/mL — ABNORMAL HIGH (ref 0.35–4.50)

## 2020-03-03 LAB — CBC WITH DIFFERENTIAL/PLATELET
Basophils Absolute: 0 10*3/uL (ref 0.0–0.1)
Basophils Relative: 0.9 % (ref 0.0–3.0)
Eosinophils Absolute: 0.2 10*3/uL (ref 0.0–0.7)
Eosinophils Relative: 4.5 % (ref 0.0–5.0)
HCT: 40 % (ref 36.0–46.0)
Hemoglobin: 13.6 g/dL (ref 12.0–15.0)
Lymphocytes Relative: 31.1 % (ref 12.0–46.0)
Lymphs Abs: 1.5 10*3/uL (ref 0.7–4.0)
MCHC: 34 g/dL (ref 30.0–36.0)
MCV: 88.6 fl (ref 78.0–100.0)
Monocytes Absolute: 0.4 10*3/uL (ref 0.1–1.0)
Monocytes Relative: 8.7 % (ref 3.0–12.0)
Neutro Abs: 2.7 10*3/uL (ref 1.4–7.7)
Neutrophils Relative %: 54.8 % (ref 43.0–77.0)
Platelets: 279 10*3/uL (ref 150.0–400.0)
RBC: 4.51 Mil/uL (ref 3.87–5.11)
RDW: 14.1 % (ref 11.5–15.5)
WBC: 4.8 10*3/uL (ref 4.0–10.5)

## 2020-03-03 LAB — LIPID PANEL
Cholesterol: 193 mg/dL (ref 0–200)
HDL: 49.8 mg/dL (ref >39.00)
LDL Cholesterol: 115 mg/dL — ABNORMAL HIGH (ref 0–99)
NonHDL: 142.83
Total CHOL/HDL Ratio: 4
Triglycerides: 137 mg/dL (ref 0.0–149.0)
VLDL: 27.4 mg/dL (ref 0.0–40.0)

## 2020-03-03 LAB — HEMOGLOBIN A1C: Hgb A1c MFr Bld: 5.7 % (ref 4.6–6.5)

## 2020-03-05 NOTE — Progress Notes (Signed)
Thyroid is off Sugar up some   Will review at your upcoming visit and  address follow  up.

## 2020-03-10 ENCOUNTER — Other Ambulatory Visit: Payer: Self-pay

## 2020-03-10 NOTE — Progress Notes (Signed)
Chief Complaint  Patient presents with  . Annual Exam    Pt would like to discuss an alternative for zantac since she can no longer get it     HPI: Patient  Victoria Carter  55 y.o. comes in today for Preventive Health Care visit  And Chronic disease management   Over all doing better sp hip surgery   but back to tobacco and has gained weight  ADHD  Back up to 40 mg  But issue   With coverage and cost.  Thyroid  Reg taking  HLD  Taking 40 mg equivalent  Mood stopped wellbutrin  And still on fluoxetine.  Ranitidine  Still with heart burn   At times  Hb  And laying down but no dysphagia.  omeprezole one of 2 if bad.  Taking every day .  No dysphagia what else to do  OS using vpap at leas 4 x per week  Thyoird taking daily with adderall?  Current Outpatient Medications on File Prior to Visit  Medication Sig Dispense Refill  . acetaminophen (TYLENOL) 500 MG tablet Take 2 tablets (1,000 mg total) by mouth every 6 (six) hours as needed for mild pain or moderate pain. 60 tablet 6  . amphetamine-dextroamphetamine (ADDERALL XR) 20 MG 24 hr capsule Take 2 capsules (40 mg total) by mouth daily. [employer] 60 capsule 0  . atorvastatin (LIPITOR) 80 MG tablet Take 0.5 tablets (40 mg total) by mouth daily. 15 tablet 5  . Calcium 500 MG CHEW 1 chewable tablet daily 30 tablet 6  . FLUoxetine (PROZAC) 20 MG capsule TAKE 2 CAPSULES BY MOUTH EVERY DAY 60 capsule 5  . levothyroxine (SYNTHROID) 50 MCG tablet Take 1 tablet (50 mcg total) by mouth daily. 30 tablet 5  . Multiple Vitamins-Minerals ([facility] DAILY GUMMIES) CHEW Chew 2 Units by mouth daily. 60 tablet 6   No current facility-administered medications on file prior to visit.    Health Maintenance  Topic Date Due  . PAP SMEAR-Modifier  11/06/2018  . MAMMOGRAM  11/29/2021  . TETANUS/TDAP  06/11/2022  . COLONOSCOPY  11/14/2022  . INFLUENZA VACCINE  Completed  . Hepatitis C Screening  Completed  . HIV Screening  Completed   Health  Maintenance Review LIFESTYLE:  Exercise:  Hip exercises  toget better  rha and some hip left  Tobacco/ETS:   At least.  1/2 ppd  Alcohol: moderate  notas much  Sugar beverages: not regular.  Sleep:  Drug use: no HH of 1 plus dog  Work:  Better  after hip replacement  From home 55 hours  Last period  2017  ROS:  GEN/ HEENT: No fever, significant weight changes sweats headaches vision problems hearing changes, CV/ PULM; No chest pain shortness of breath cough, syncope,edema  change in exercise tolerance. GI /GU: No adominal pain, vomiting, change in bowel habits. No blood in the stool. No significant GU symptoms. SKIN/HEME: ,no acute skin rashes suspicious lesions or bleeding. No lymphadenopathy, nodules, masses.  NEURO/ PSYCH:  No neurologic signs such as weakness numbness. No depression anxiety. IMM/ Allergy: No unusual infections.  Allergy .   REST of 12 system review negative except as per HPI   Past Medical History:  Diagnosis Date  . Allergic rhinitis   . Depression   . Hx of abnormal cervical Pap smear    used cryo when first married and pregnant  . Hyperglycemia   . Hyperlipidemia   . Sinusitis   . UTI (lower urinary  tract infection)     Past Surgical History:  Procedure Laterality Date  . BREAST BIOPSY  1987  . BREAST EXCISIONAL BIOPSY Right   . CERVICAL BIOPSY    . CESAREAN SECTION      Family History  Problem Relation Age of Onset  . Atrial fibrillation Mother   . Heart attack Brother        age 24  . Diabetes type II Other   . Hyperlipidemia Other   . Factor V Leiden deficiency Daughter        Also husband    Social History   Socioeconomic History  . Marital status: Unknown    Spouse name: Not on file  . Number of children: Not on file  . Years of education: Not on file  . Highest education level: Not on file  Occupational History  . Not on file  Tobacco Use  . Smoking status: Former Smoker    Packs/day: 0.50    Years: 10.00    Pack years:  5.00    Quit date: 04/12/2009    Years since quitting: 10.9  . Smokeless tobacco: Never Used  Substance and Sexual Activity  . Alcohol use: Yes    Alcohol/week: 1.0 standard drinks    Types: 1 Glasses of wine per week  . Drug use: No  . Sexual activity: Not on file  Other Topics Concern  . Not on file  Social History Narrative   Occupation: 40 per week 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  [employer]  In stress position    Married now separated  04-13-17  Divorced    Regular exercise- yes  yoga   HH of 1-  girls home from college   Pet dogs passed away 04-13-16   G2P2   Social Determinants of Health   Financial Resource Strain:   . Difficulty of Paying Living Expenses:   Food Insecurity:   . Worried About Programme researcher, broadcasting/film/video in the Last Year:   . Barista in the Last Year:   Transportation Needs:   . Freight forwarder (Medical):   Marland Kitchen Lack of Transportation (Non-Medical):   Physical Activity:   . Days of Exercise per Week:   . Minutes of Exercise per Session:   Stress:   . Feeling of Stress :   Social Connections:   . Frequency of Communication with Friends and Family:   . Frequency of Social Gatherings with Friends and Family:   . Attends Religious Services:   . Active Member of Clubs or Organizations:   . Attends Banker Meetings:   Marland Kitchen Marital Status:        EXAM:  BP 122/76 (BP Location: Right Arm, Patient Position: Sitting, Cuff Size: Normal)   Pulse 95   Temp 97.8 F (36.6 C) (Temporal)   Ht 5\' 7"  (1.702 m)   Wt 218 lb 3.2 oz (99 kg)   SpO2 97%   BMI 34.17 kg/m   Body mass index is 34.17 kg/m. Wt Readings from Last 3 Encounters:  03/11/20 218 lb 3.2 oz (99 kg)  01/27/19 205 lb 11.2 oz (93.3 kg)  12/19/18 206 lb (93.4 kg)    Physical Exam: Vital signs reviewed 12/21/18 is a well-developed well-nourished alert cooperative    who appearsr stated age in no acute distress.  HEENT: normocephalic atraumatic , Eyes: PERRL EOM's full, conjunctiva clear, Nares:  paten,t no deformity discharge or tenderness., Ears: no deformity EAC's clear TMs with normal landmarks. Mouth: clear  OP, masked  NECK: supple without masses, thyromegaly or bruits. CHEST/PULM:  Clear to auscultation and percussion breath sounds equal no wheeze , rales or rhonchi. No chest wall deformities or tenderness. Breast: normal by inspection . No dimpling, discharge, masses, tenderness or discharge . CV: PMI is nondisplaced, S1 S2 no gallops, murmurs, rubs. Peripheral pulses are full without delay.No JVD .  ABDOMEN: Bowel sounds normal nontender  No guard or rebound, no hepato splenomegal no CVA tenderness.  No hernia. Extremtities:  No clubbing cyanosis or edema, no acute joint swelling or redness no focal atrophy NEURO:  Oriented x3, cranial nerves 3-12 appear to be intact, no obvious focal weakness,gait within normal limits no abnormal reflexes or asymmetrical SKIN: No acute rashes normal turgor, color, no bruising or petechiae. PSYCH: Oriented, good eye contact, no obvious depression anxiety, cognition and judgment appear normal. LN: no cervical axillary inguinal adenopathy Pelvic: NL ext GU, labia clear without lesions or rash . Vagina no lesions .Cervix: clear  UTERUS: Neg CMT Adnexa:  clear no masses . PAP done with hr hpv   Lab Results  Component Value Date   WBC 4.8 03/03/2020   HGB 13.6 03/03/2020   HCT 40.0 03/03/2020   PLT 279.0 03/03/2020   GLUCOSE 114 (H) 03/03/2020   CHOL 193 03/03/2020   TRIG 137.0 03/03/2020   HDL 49.80 03/03/2020   LDLDIRECT 88.6 11/01/2014   LDLCALC 115 (H) 03/03/2020   ALT 23 03/03/2020   AST 16 03/03/2020   NA 139 03/03/2020   K 4.3 03/03/2020   CL 102 03/03/2020   CREATININE 1.00 03/03/2020   BUN 11 03/03/2020   CO2 29 03/03/2020   TSH 4.98 (H) 03/03/2020   INR 0.9 07/30/2019   HGBA1C 5.7 03/03/2020    BP Readings from Last 3 Encounters:  03/11/20 122/76  01/27/19 122/78  12/19/18 104/76    Lab results reviewed with patient     ASSESSMENT AND PLAN:  Discussed the following assessment and plan:    ICD-10-CM   1. Routine general medical examination at a health care facility  Z00.00   2. Medication management  Z79.899 TSH    Basic metabolic panel    Hemoglobin A1c  3. Hyperlipidemia, unspecified hyperlipidemia type  E78.5 TSH    Basic metabolic panel    Hemoglobin A1c  4. Pre-diabetes  R73.03 TSH    Basic metabolic panel    Hemoglobin A1c  5. Attention deficit hyperactivity disorder (ADHD), combined type  F90.2    cont medication 40 mg works best  6. OSA (obstructive sleep apnea)  G47.33   7. Encounter for gynecological examination without abnormal finding  Z01.419   8. Hypothyroidism, unspecified type  E03.9 TSH    Basic metabolic panel    Hemoglobin A1c  9. Screening for cervical cancer  Z12.4 PAP [[place]]  10. Tobacco use  Z72.0    relaptsed  counseled to stop  11. Heartburn  R12   12. BMI 34.0-34.9,adult  Z68.34    tsh elevated  Take alone and recheck lab 3 mos and then rov virtual ok  gerd  Try pepcid and wean omeprazole as tolerated and reassess  Healthy weight loss  Return in about 3 months (around 06/11/2020) for lab pre visit   can do virtual or in person .  Patient Care Team: Clell Trahan, Neta Mends, MD as PCP - Juanna Cao, MD (Dermatology) Charna Elizabeth, MD as Consulting Physician (Gastroenterology) Vilinda Flake, PhD as Consulting Physician (Psychology) Jodi Geralds, MD as  Consulting Physician (Orthopedic Surgery) Patient Instructions   Will notify you  of labs when available.   Try famotidine  Twice a day and add on  omeprazole as needed ( wean cause can  rebound acid if stopped suddenly .   Thyroid is off a bit may need ajustent  No diabetes but sugar is up. Advise repeat tsh and   Fasting blood glucose in about 3 months and then rov or virtual visit .   Healthy weight loss advised  Stop tobacco  Plan   Health Maintenance, Female Adopting a healthy lifestyle and  getting preventive care are important in promoting health and wellness. Ask your health care provider about:  The right schedule for you to have regular tests and exams.  Things you can do on your own to prevent diseases and keep yourself healthy. What should I know about diet, weight, and exercise? Eat a healthy diet   Eat a diet that includes plenty of vegetables, fruits, low-fat dairy products, and lean protein.  Do not eat a lot of foods that are high in solid fats, added sugars, or sodium. Maintain a healthy weight Body mass index (BMI) is used to identify weight problems. It estimates body fat based on height and weight. Your health care provider can help determine your BMI and help you achieve or maintain a healthy weight. Get regular exercise Get regular exercise. This is one of the most important things you can do for your health. Most adults should:  Exercise for at least 150 minutes each week. The exercise should increase your heart rate and make you sweat (moderate-intensity exercise).  Do strengthening exercises at least twice a week. This is in addition to the moderate-intensity exercise.  Spend less time sitting. Even light physical activity can be beneficial. Watch cholesterol and blood lipids Have your blood tested for lipids and cholesterol at 56 years of age, then have this test every 5 years. Have your cholesterol levels checked more often if:  Your lipid or cholesterol levels are high.  You are older than 56 years of age.  You are at high risk for heart disease. What should I know about cancer screening? Depending on your health history and family history, you may need to have cancer screening at various ages. This may include screening for:  Breast cancer.  Cervical cancer.  Colorectal cancer.  Skin cancer.  Lung cancer. What should I know about heart disease, diabetes, and high blood pressure? Blood pressure and heart disease  High blood pressure  causes heart disease and increases the risk of stroke. This is more likely to develop in people who have high blood pressure readings, are of African descent, or are overweight.  Have your blood pressure checked: ? Every 3-5 years if you are 7518-56 years of age. ? Every year if you are 56 years old or older. Diabetes Have regular diabetes screenings. This checks your fasting blood sugar level. Have the screening done:  Once every three years after age 56 if you are at a normal weight and have a low risk for diabetes.  More often and at a younger age if you are overweight or have a high risk for diabetes. What should I know about preventing infection? Hepatitis B If you have a higher risk for hepatitis B, you should be screened for this virus. Talk with your health care provider to find out if you are at risk for hepatitis B infection. Hepatitis C Testing is recommended for:  Everyone born  from 1946 through April 10, 1965.  Anyone with known risk factors for hepatitis C. Sexually transmitted infections (STIs)  Get screened for STIs, including gonorrhea and chlamydia, if: ? You are sexually active and are younger than 56 years of age. ? You are older than 56 years of age and your health care provider tells you that you are at risk for this type of infection. ? Your sexual activity has changed since you were last screened, and you are at increased risk for chlamydia or gonorrhea. Ask your health care provider if you are at risk.  Ask your health care provider about whether you are at high risk for HIV. Your health care provider may recommend a prescription medicine to help prevent HIV infection. If you choose to take medicine to prevent HIV, you should first get tested for HIV. You should then be tested every 3 months for as long as you are taking the medicine. Pregnancy  If you are about to stop having your period (premenopausal) and you may become pregnant, seek counseling before you get  pregnant.  Take 400 to 800 micrograms (mcg) of folic acid every day if you become pregnant.  Ask for birth control (contraception) if you want to prevent pregnancy. Osteoporosis and menopause Osteoporosis is a disease in which the bones lose minerals and strength with aging. This can result in bone fractures. If you are 85 years old or older, or if you are at risk for osteoporosis and fractures, ask your health care provider if you should:  Be screened for bone loss.  Take a calcium or vitamin D supplement to lower your risk of fractures.  Be [place] hormone replacement therapy (HRT) to treat symptoms of menopause. Follow these instructions at home: Lifestyle  Do not use any products that contain nicotine or tobacco, such as cigarettes, e-cigarettes, and chewing tobacco. If you need help quitting, ask your health care provider.  Do not use street drugs.  Do not share needles.  Ask your health care provider for help if you need support or information about quitting drugs. Alcohol use  Do not drink alcohol if: ? Your health care provider tells you not to drink. ? You are pregnant, may be pregnant, or are planning to become pregnant.  If you drink alcohol: ? Limit how much you use to 0-1 drink a day. ? Limit intake if you are breastfeeding.  Be aware of how much alcohol is in your drink. In the [place] one drink equals one 12 oz bottle of beer (355 mL), one 5 oz glass of wine (148 mL), or one 1 oz glass of hard liquor (44 mL). General instructions  Schedule regular health, dental, and eye exams.  Stay current with your vaccines.  Tell your health care provider if: ? You often feel depressed. ? You have ever been abused or do not feel safe at home. Summary  Adopting a healthy lifestyle and getting preventive care are important in promoting health and wellness.  Follow your health care provider's instructions about healthy diet, exercising, and getting tested or screened for  diseases.  Follow your health care provider's instructions on monitoring your cholesterol and blood pressure. This information is not intended to replace advice [place] to you by your health care provider. Make sure you discuss any questions you have with your health care provider. Document Revised: 12/10/2018 Document Reviewed: 12/10/2018 Elsevier Patient Education  2020 [employer].      [place] K. Alfhild Partch M.D.

## 2020-03-11 ENCOUNTER — Encounter: Payer: Self-pay | Admitting: Internal Medicine

## 2020-03-11 ENCOUNTER — Other Ambulatory Visit (HOSPITAL_COMMUNITY)
Admission: RE | Admit: 2020-03-11 | Discharge: 2020-03-11 | Disposition: A | Discharge status: Home / Self Care | Payer: No Typology Code available for payment source | Source: Ambulatory Visit | Attending: Internal Medicine | Admitting: Internal Medicine

## 2020-03-11 ENCOUNTER — Ambulatory Visit (INDEPENDENT_AMBULATORY_CARE_PROVIDER_SITE_OTHER): Payer: No Typology Code available for payment source | Admitting: Internal Medicine

## 2020-03-11 VITALS — BP 122/76 | HR 95 | Temp 97.8°F | Ht 67.0 in | Wt 218.2 lb

## 2020-03-11 DIAGNOSIS — E785 Hyperlipidemia, unspecified: Secondary | ICD-10-CM | POA: Diagnosis not present

## 2020-03-11 DIAGNOSIS — Z124 Encounter for screening for malignant neoplasm of cervix: Secondary | ICD-10-CM | POA: Diagnosis not present

## 2020-03-11 DIAGNOSIS — R12 Heartburn: Secondary | ICD-10-CM

## 2020-03-11 DIAGNOSIS — F902 Attention-deficit hyperactivity disorder, combined type: Secondary | ICD-10-CM

## 2020-03-11 DIAGNOSIS — Z79899 Other long term (current) drug therapy: Secondary | ICD-10-CM

## 2020-03-11 DIAGNOSIS — Z720 Tobacco use: Secondary | ICD-10-CM

## 2020-03-11 DIAGNOSIS — Z01419 Encounter for gynecological examination (general) (routine) without abnormal findings: Secondary | ICD-10-CM

## 2020-03-11 DIAGNOSIS — Z0000 Encounter for general adult medical examination without abnormal findings: Secondary | ICD-10-CM

## 2020-03-11 DIAGNOSIS — G4733 Obstructive sleep apnea (adult) (pediatric): Secondary | ICD-10-CM

## 2020-03-11 DIAGNOSIS — E039 Hypothyroidism, unspecified: Secondary | ICD-10-CM

## 2020-03-11 DIAGNOSIS — R7303 Prediabetes: Secondary | ICD-10-CM | POA: Diagnosis not present

## 2020-03-11 DIAGNOSIS — Z6834 Body mass index (BMI) 34.0-34.9, adult: Secondary | ICD-10-CM

## 2020-03-11 MED ORDER — FAMOTIDINE 20 MG PO TABS: 20.0000 mg | ORAL_TABLET | Freq: Two times a day (BID) | ORAL | 3 refills | Status: DC

## 2020-03-11 NOTE — Patient Instructions (Addendum)
Will notify you  of labs when available.   Try famotidine  Twice a day and add on  omeprazole as needed ( wean cause can  rebound acid if stopped suddenly .   Thyroid is off a bit may need ajustent  No diabetes but sugar is up. Advise repeat tsh and   Fasting blood glucose in about 3 months and then rov or virtual visit .   Healthy weight loss advised  Stop tobacco  Plan   Health Maintenance, Female Adopting a healthy lifestyle and getting preventive care are important in promoting health and wellness. Ask your health care provider about:  The right schedule for you to have regular tests and exams.  Things you can do on your own to prevent diseases and keep yourself healthy. What should I know about diet, weight, and exercise? Eat a healthy diet   Eat a diet that includes plenty of vegetables, fruits, low-fat dairy products, and lean protein.  Do not eat a lot of foods that are high in solid fats, added sugars, or sodium. Maintain a healthy weight Body mass index (BMI) is used to identify weight problems. It estimates body fat based on height and weight. Your health care provider can help determine your BMI and help you achieve or maintain a healthy weight. Get regular exercise Get regular exercise. This is one of the most important things you can do for your health. Most adults should:  Exercise for at least 150 minutes each week. The exercise should increase your heart rate and make you sweat (moderate-intensity exercise).  Do strengthening exercises at least twice a week. This is in addition to the moderate-intensity exercise.  Spend less time sitting. Even light physical activity can be beneficial. Watch cholesterol and blood lipids Have your blood tested for lipids and cholesterol at 56 years of age, then have this test every 5 years. Have your cholesterol levels checked more often if:  Your lipid or cholesterol levels are high.  You are older than 56 years of  age.  You are at high risk for heart disease. What should I know about cancer screening? Depending on your health history and family history, you may need to have cancer screening at various ages. This may include screening for:  Breast cancer.  Cervical cancer.  Colorectal cancer.  Skin cancer.  Lung cancer. What should I know about heart disease, diabetes, and high blood pressure? Blood pressure and heart disease  High blood pressure causes heart disease and increases the risk of stroke. This is more likely to develop in people who have high blood pressure readings, are of African descent, or are overweight.  Have your blood pressure checked: ? Every 3-5 years if you are 15-33 years of age. ? Every year if you are 53 years old or older. Diabetes Have regular diabetes screenings. This checks your fasting blood sugar level. Have the screening done:  Once every three years after age 39 if you are at a normal weight and have a low risk for diabetes.  More often and at a younger age if you are overweight or have a high risk for diabetes. What should I know about preventing infection? Hepatitis B If you have a higher risk for hepatitis B, you should be screened for this virus. Talk with your health care provider to find out if you are at risk for hepatitis B infection. Hepatitis C Testing is recommended for:  Everyone born from [birth year] through 10-26-1965.  Anyone with known risk  factors for hepatitis C. Sexually transmitted infections (STIs)  Get screened for STIs, including gonorrhea and chlamydia, if: ? You are sexually active and are younger than 56 years of age. ? You are older than 56 years of age and your health care provider tells you that you are at risk for this type of infection. ? Your sexual activity has changed since you were last screened, and you are at increased risk for chlamydia or gonorrhea. Ask your health care provider if you are at risk.  Ask your health care  provider about whether you are at high risk for HIV. Your health care provider may recommend a prescription medicine to help prevent HIV infection. If you choose to take medicine to prevent HIV, you should first get tested for HIV. You should then be tested every 3 months for as long as you are taking the medicine. Pregnancy  If you are about to stop having your period (premenopausal) and you may become pregnant, seek counseling before you get pregnant.  Take 400 to 800 micrograms (mcg) of folic acid every day if you become pregnant.  Ask for birth control (contraception) if you want to prevent pregnancy. Osteoporosis and menopause Osteoporosis is a disease in which the bones lose minerals and strength with aging. This can result in bone fractures. If you are 41 years old or older, or if you are at risk for osteoporosis and fractures, ask your health care provider if you should:  Be screened for bone loss.  Take a calcium or vitamin D supplement to lower your risk of fractures.  Be Cones hormone replacement therapy (HRT) to treat symptoms of menopause. Follow these instructions at home: Lifestyle  Do not use any products that contain nicotine or tobacco, such as cigarettes, e-cigarettes, and chewing tobacco. If you need help quitting, ask your health care provider.  Do not use street drugs.  Do not share needles.  Ask your health care provider for help if you need support or information about quitting drugs. Alcohol use  Do not drink alcohol if: ? Your health care provider tells you not to drink. ? You are pregnant, may be pregnant, or are planning to become pregnant.  If you drink alcohol: ? Limit how much you use to 0-1 drink a day. ? Limit intake if you are breastfeeding.  Be aware of how much alcohol is in your drink. In the [place] one drink equals one 12 oz bottle of beer (355 mL), one 5 oz glass of wine (148 mL), or one 1 oz glass of hard liquor (44 mL). General  instructions  Schedule regular health, dental, and eye exams.  Stay current with your vaccines.  Tell your health care provider if: ? You often feel depressed. ? You have ever been abused or do not feel safe at home. Summary  Adopting a healthy lifestyle and getting preventive care are important in promoting health and wellness.  Follow your health care provider's instructions about healthy diet, exercising, and getting tested or screened for diseases.  Follow your health care provider's instructions on monitoring your cholesterol and blood pressure. This information is not intended to replace advice [place] to you by your health care provider. Make sure you discuss any questions you have with your health care provider. Document Revised: 12/10/2018 Document Reviewed: 12/10/2018 Elsevier Patient Education  2020 [employer].

## 2020-03-15 LAB — CYTOLOGY - PAP
Comment: NEGATIVE
Diagnosis: NEGATIVE
High risk HPV: NEGATIVE

## 2020-03-15 NOTE — Progress Notes (Signed)
Tell patient PAP is normal. HPV high  risk is negative

## 2020-03-24 ENCOUNTER — Ambulatory Visit: Payer: No Typology Code available for payment source | Attending: Internal Medicine

## 2020-03-24 DIAGNOSIS — Z23 Encounter for immunization: Secondary | ICD-10-CM

## 2020-03-24 NOTE — Progress Notes (Signed)
   Covid-19 Vaccination Clinic  Name:  Victoria Carter    MRN: [id number] DOB: [birth year]  03/24/2020  Victoria Carter was observed post Covid-19 immunization for 15 minutes without incident. She was provided with Vaccine Information Sheet and instruction to access the V-Safe system.   Victoria Carter was instructed to call 911 with any severe reactions post vaccine: Marland Kitchen Difficulty breathing  . Swelling of face and throat  . A fast heartbeat  . A bad rash all over body  . Dizziness and weakness   Immunizations Administered    Name Date Dose VIS Date Route   Pfizer COVID-19 Vaccine 03/24/2020  1:26 PM 0.3 mL 12/11/2019 Intramuscular   Manufacturer: [employer], [employer]   Lot: [id number]   NDC: [address]-4

## 2020-03-25 ENCOUNTER — Other Ambulatory Visit: Payer: Self-pay | Admitting: Internal Medicine

## 2020-03-25 NOTE — Telephone Encounter (Signed)
Medication Refill: Prozac  [facility] [address] FAX: [phone]

## 2020-03-28 MED ORDER — FLUOXETINE HCL 20 MG PO CAPS: 40.0000 mg | ORAL_CAPSULE | Freq: Every day | ORAL | 1 refills | Status: DC

## 2020-03-28 NOTE — Telephone Encounter (Signed)
Rx done. 

## 2020-04-04 ENCOUNTER — Telehealth: Payer: Self-pay | Admitting: Internal Medicine

## 2020-04-04 NOTE — Telephone Encounter (Signed)
Medication refill: ADDERALL 20 MG [facility]: [facility] [address] FAX: [phone]

## 2020-04-05 NOTE — Telephone Encounter (Signed)
Last OV 03/11/2020  Last filled 03/01/2020, # 60 with 0 refills

## 2020-04-07 MED ORDER — AMPHETAMINE-DEXTROAMPHET ER 20 MG PO CP24: 40.0000 mg | ORAL_CAPSULE | Freq: Every day | ORAL | 0 refills | Status: DC

## 2020-04-07 NOTE — Telephone Encounter (Signed)
Refilled x1 

## 2020-04-07 NOTE — Addendum Note (Signed)
Addended byBerniece Andreas K on: 04/07/2020 11:39 AM   Modules accepted: Orders

## 2020-04-18 ENCOUNTER — Ambulatory Visit: Payer: No Typology Code available for payment source | Attending: Internal Medicine

## 2020-04-18 DIAGNOSIS — Z23 Encounter for immunization: Secondary | ICD-10-CM

## 2020-04-18 NOTE — Progress Notes (Signed)
   Covid-19 Vaccination Clinic  Name:  Victoria Carter    MRN: [id number] DOB: 06-18-1965  04/18/2020  [name] was observed post Covid-19 immunization for 15 minutes without incident. She was provided with Vaccine Information Sheet and instruction to access the V-Safe system.   [name] was instructed to call 911 with any severe reactions post vaccine: Marland Kitchen Difficulty breathing  . Swelling of face and throat  . A fast heartbeat  . A bad rash all over body  . Dizziness and weakness   Immunizations Administered    Name Date Dose VIS Date Route   Pfizer COVID-19 Vaccine 04/18/2020 12:19 PM 0.3 mL 02/24/2019 Intramuscular   Manufacturer: [employer], [employer]   Lot: [id number]   NDC: [address]-4

## 2020-05-12 ENCOUNTER — Telehealth: Payer: Self-pay | Admitting: Internal Medicine

## 2020-05-12 NOTE — Telephone Encounter (Signed)
Patient needs refill on her Adderall.  3 days left  [facility]- [facility] [address]

## 2020-05-16 MED ORDER — AMPHETAMINE-DEXTROAMPHET ER 20 MG PO CP24: 40.0000 mg | ORAL_CAPSULE | Freq: Every day | ORAL | 0 refills | Status: DC

## 2020-05-16 NOTE — Telephone Encounter (Signed)
Last OV 03/11/2020  Last filled 04/07/2020, # 60 with 0 refills

## 2020-05-16 NOTE — Telephone Encounter (Signed)
Sent in electronically .  

## 2020-06-02 ENCOUNTER — Other Ambulatory Visit: Payer: Self-pay | Admitting: Internal Medicine

## 2020-06-20 ENCOUNTER — Telehealth: Payer: Self-pay | Admitting: Internal Medicine

## 2020-06-20 MED ORDER — AMPHETAMINE-DEXTROAMPHET ER 20 MG PO CP24: 40.0000 mg | ORAL_CAPSULE | Freq: Every day | ORAL | 0 refills | Status: DC

## 2020-06-20 NOTE — Telephone Encounter (Signed)
Last OV 03/11/2020  Last filled 05/16/2020, # 60 with 0 refills

## 2020-06-20 NOTE — Telephone Encounter (Signed)
Pt is calling in stating she needs a refill on amphetamine-dextroamphetamine (ADDERALL XR) 20 MG   [facility] [address]

## 2020-06-20 NOTE — Addendum Note (Signed)
Addended byBerniece Andreas K on: 06/20/2020 05:12 PM   Modules accepted: Orders

## 2020-06-20 NOTE — Telephone Encounter (Signed)
Sent in electronically .  

## 2020-06-23 ENCOUNTER — Other Ambulatory Visit: Payer: Self-pay

## 2020-06-24 ENCOUNTER — Other Ambulatory Visit (INDEPENDENT_AMBULATORY_CARE_PROVIDER_SITE_OTHER): Payer: No Typology Code available for payment source

## 2020-06-24 DIAGNOSIS — Z79899 Other long term (current) drug therapy: Secondary | ICD-10-CM | POA: Diagnosis not present

## 2020-06-24 DIAGNOSIS — R7303 Prediabetes: Secondary | ICD-10-CM | POA: Diagnosis not present

## 2020-06-24 DIAGNOSIS — E785 Hyperlipidemia, unspecified: Secondary | ICD-10-CM | POA: Diagnosis not present

## 2020-06-24 DIAGNOSIS — E039 Hypothyroidism, unspecified: Secondary | ICD-10-CM

## 2020-06-24 LAB — BASIC METABOLIC PANEL
BUN: 23 mg/dL (ref 6–23)
CO2: 26 mEq/L (ref 19–32)
Calcium: 9.5 mg/dL (ref 8.4–10.5)
Chloride: 105 mEq/L (ref 96–112)
Creatinine, Ser: 1 mg/dL (ref 0.40–1.20)
GFR: 57.31 mL/min — ABNORMAL LOW (ref >60.00)
Glucose, Bld: 99 mg/dL (ref 70–99)
Potassium: 4.8 mEq/L (ref 3.5–5.1)
Sodium: 141 mEq/L (ref 135–145)

## 2020-06-24 LAB — TSH: TSH: 2.62 u[IU]/mL (ref 0.35–4.50)

## 2020-06-24 LAB — HEMOGLOBIN A1C: Hgb A1c MFr Bld: 5.8 % (ref 4.6–6.5)

## 2020-06-28 NOTE — Progress Notes (Signed)
Thyroid is in range   blood sugar is normal

## 2020-07-05 ENCOUNTER — Other Ambulatory Visit: Payer: Self-pay | Admitting: Internal Medicine

## 2020-07-06 ENCOUNTER — Other Ambulatory Visit: Payer: Self-pay | Admitting: Internal Medicine

## 2020-07-22 ENCOUNTER — Telehealth: Payer: Self-pay | Admitting: Internal Medicine

## 2020-07-22 MED ORDER — AMPHETAMINE-DEXTROAMPHET ER 20 MG PO CP24: 40.0000 mg | ORAL_CAPSULE | Freq: Every day | ORAL | 0 refills | Status: DC

## 2020-07-22 NOTE — Telephone Encounter (Signed)
Pt is requesting a refill on Adderall XR 20 mg. Pt uses [facility]-[address]

## 2020-07-22 NOTE — Telephone Encounter (Signed)
Last OV 03/11/2020  Last filled 06/20/2020, # 60 with 0 refills

## 2020-07-22 NOTE — Addendum Note (Signed)
Addended byMadelin Headings on: 07/22/2020 04:18 PM   Modules accepted: Orders

## 2020-08-01 ENCOUNTER — Other Ambulatory Visit: Payer: Self-pay | Admitting: Internal Medicine

## 2020-08-22 ENCOUNTER — Telehealth: Payer: Self-pay | Admitting: Internal Medicine

## 2020-08-22 NOTE — Telephone Encounter (Signed)
Pt needs refill for Adderall sent to the pharmacy[facility] [address]

## 2020-08-22 NOTE — Telephone Encounter (Signed)
Last OV 03/11/2020  Last filled 07/22/20, # 60 with 0 refills

## 2020-08-23 MED ORDER — AMPHETAMINE-DEXTROAMPHET ER 20 MG PO CP24: 40.0000 mg | ORAL_CAPSULE | Freq: Every day | ORAL | 0 refills | Status: DC

## 2020-08-23 NOTE — Telephone Encounter (Signed)
Sent in electronically .  

## 2020-08-31 ENCOUNTER — Other Ambulatory Visit: Payer: Self-pay | Admitting: Internal Medicine

## 2020-09-30 ENCOUNTER — Telehealth: Payer: Self-pay | Admitting: Internal Medicine

## 2020-09-30 ENCOUNTER — Other Ambulatory Visit: Payer: Self-pay | Admitting: Internal Medicine

## 2020-09-30 NOTE — Telephone Encounter (Signed)
pt need a rx for  amphetamine-dextroamphetamine (ADDERALL XR) 20 MG 24 hr capsule  [facility] [place], [place] - [address]  Phone:  [phone] Fax:  [phone]

## 2020-09-30 NOTE — Telephone Encounter (Signed)
Labs 06/24/20 OV 03/11/20 Filled last 08/23/20  No recent contract on file

## 2020-10-03 MED ORDER — AMPHETAMINE-DEXTROAMPHET ER 20 MG PO CP24: 40.0000 mg | ORAL_CAPSULE | Freq: Every day | ORAL | 0 refills | Status: DC

## 2020-10-03 NOTE — Telephone Encounter (Signed)
Needs  appt med check ( virtual ok) for further refills  Please arrange  For fu appt

## 2020-10-03 NOTE — Telephone Encounter (Signed)
Called patient and scheduled her a MyChart visit on 10/05/20 at 9:30am. Patient verbalized an understanding.

## 2020-10-04 NOTE — Progress Notes (Signed)
Virtual Visit via Video Note  I connected with@ on 10/05/20 at  9:30 AM EDT by a video enabled telemedicine application and verified that I am speaking with the correct person using two identifiers. Location patient: home Location provider:work or home office Persons participating in the virtual visit: patient, provider  W[place]th national recommendations  regarding COVID 19 pandemic   video visit is advised over in office visit for this patient.  Patient aware  of the limitations of evaluation and management by telemedicine and  availability of in person appointments. and agreed to proceed.   HPI: Victoria Carter presents for video visit ADD ADHD: Takes medicine Monday through Friday plus seems to help but still is disorganized not sure if it is wearing off.  Note on expected side effects. Sleep 7-8 not using CPAP regularly gets in rhythm and then gets all Tobacco half half a pack per day etoh  Social   8- 10 hours work per day. Active yard work chores has dog Mood thinks the fluoxetine is helpful when she misses it gets a little weepy Taking thyroid medicine regularly. Heartburn tends to occur after the evening meal with reclining taking Pepcid twice a day is running out.  Asks about other options.  ROS: See pertinent positives and negatives per HPI.  Past Medical History:  Diagnosis Date  . Allergic rhinitis   . Depression   . Hx of abnormal cervical Pap smear    used cryo when first married and pregnant  . Hyperglycemia   . Hyperlipidemia   . Sinusitis   . UTI (lower urinary tract infection)     Past Surgical History:  Procedure Laterality Date  . BREAST BIOPSY  1987  . BREAST EXCISIONAL BIOPSY Right   . CERVICAL BIOPSY    . CESAREAN SECTION      Family History  Problem Relation Age of Onset  . Atrial fibrillation Mother   . Heart attack Brother        age 4  . Diabetes type II Other   . Hyperlipidemia Other   . Factor V Leiden deficiency Daughter        Also  husband    Social History   Tobacco Use  . Smoking status: Former Smoker    Packs/day: 0.50    Years: 10.00    Pack years: 5.00    Quit date: 04/12/2009    Years since quitting: 11.4  . Smokeless tobacco: Never Used  Vaping Use  . Vaping Use: Never used  Substance Use Topics  . Alcohol use: Yes    Alcohol/week: 1.0 standard drink    Types: 1 Glasses of wine per week  . Drug use: No      Current Outpatient Medications:  .  acetaminophen (TYLENOL) 500 MG tablet, Take 2 tablets (1,000 mg total) by mouth every 6 (six) hours as needed for mild pain or moderate pain., Disp: 60 tablet, Rfl: 6 .  amphetamine-dextroamphetamine (ADDERALL XR) 20 MG 24 hr capsule, Take 2 capsules (40 mg total) by mouth daily. [employer] needed for further refills, Disp: 60 capsule, Rfl: 0 .  atorvastatin (LIPITOR) 80 MG tablet, TAKE 1/2 TABLET BY MOUTH DAILY, Disp: 15 tablet, Rfl: 5 .  Calcium 500 MG CHEW, 1 chewable tablet daily, Disp: 30 tablet, Rfl: 6 .  famotidine (PEPCID) 20 MG tablet, Take 1 tablet (20 mg total) by mouth 2 (two) times daily., Disp: 60 tablet, Rfl: 2 .  FLUoxetine (PROZAC) 20 MG capsule, TAKE TWO CAPSULES BY  MOUTH DAILY, Disp: 60 capsule, Rfl: 1 .  levothyroxine (SYNTHROID) 50 MCG tablet, TAKE ONE TABLET BY MOUTH DAILY, Disp: 30 tablet, Rfl: 5 .  Multiple Vitamins-Minerals ([employer] W[employer]ENS DAILY GUMMIES) CHEW, Chew 2 Units by mouth daily., Disp: 60 tablet, Rfl: 6  EXAM: BP Readings from Last 3 Encounters:  03/11/20 122/76  01/27/19 122/78  12/19/18 104/76    VITALS per patient if applicable:  GENERAL: alert, oriented, appears well and in no acute distress  HEENT: atraumatic, conjunttiva clear, no obvious abnormalities on inspection of external nose and ears  NECK: normal movements of the head and neck  LUNGS: on inspection no signs of respiratory distress, breathing rate appears normal, no obvious gross SOB, gasping or wheezing  CV: no obvious  cyanosis  PSYCH/NEURO: pleasant and cooperative, no obvious depression or anxiety, speech and thought processing grossly intact increased motor activity as usual normal baseline affect. Lab Results  Component Value Date   WBC 4.8 03/03/2020   HGB 13.6 03/03/2020   HCT 40.0 03/03/2020   PLT 279.0 03/03/2020   GLUCOSE 99 06/24/2020   CHOL 193 03/03/2020   TRIG 137.0 03/03/2020   HDL 49.80 03/03/2020   LDLDIRECT 88.6 11/01/2014   LDLCALC 115 (H) 03/03/2020   ALT 23 03/03/2020   AST 16 03/03/2020   NA 141 06/24/2020   K 4.8 06/24/2020   CL 105 06/24/2020   CREATININE 1.00 06/24/2020   BUN 23 06/24/2020   CO2 26 06/24/2020   TSH 2.62 06/24/2020   INR 0.9 07/30/2019   HGBA1C 5.8 06/24/2020    ASSESSMENT AND PLAN:  Discussed the following assessment and plan:    ICD-10-CM   1. Attention deficit hyperactivity disorder (ADHD), combined type  F90.2 TSH    Hemoglobin A1c    Hepatic function panel    Lipid panel    BASIC METABOLIC PANEL WITH GFR    CBC with Differential/Platelet  2. Medication management  Z79.899 TSH    Hemoglobin A1c    Hepatic function panel    Lipid panel    BASIC METABOLIC PANEL WITH GFR    CBC with Differential/Platelet  3. OSA (obstructive sleep apnea)  G47.33 TSH    Hemoglobin A1c    Hepatic function panel    Lipid panel    BASIC METABOLIC PANEL WITH GFR    CBC with Differential/Platelet  4. Tobacco use  Z72.0 TSH    Hemoglobin A1c    Hepatic function panel    Lipid panel    BASIC METABOLIC PANEL WITH GFR    CBC with Differential/Platelet  5. Heartburn  R12 TSH    Hemoglobin A1c    Hepatic function panel    Lipid panel    BASIC METABOLIC PANEL WITH GFR    CBC with Differential/Platelet  6. Hyperlipidemia, unspecified hyperlipidemia type  E78.5 TSH    Hemoglobin A1c    Hepatic function panel    Lipid panel    BASIC METABOLIC PANEL WITH GFR    CBC with Differential/Platelet  7. Fasting hyperglycemia  R73.01 TSH    Hemoglobin A1c     Hepatic function panel    Lipid panel    BASIC METABOLIC PANEL WITH GFR    CBC with Differential/Platelet    Counseled.  Strongly encouraged treating her up sleep apnea that will aggravate ADHD symptoms. Discussed organizational techniques and plans before considering changing medication. Continue fluoxetine can try adding omeprazole as needed avoid eating before reclining 3 hours. Continue same medicines otherwise. Overall things are  stable. Plan fasting lab before CPX in March.  Or as needed. Still encouraged tobacco cessation she is not contemplating at this time. We will get flu shot within the next month.  Expectant management and discussion of plan and treatment with opportunity to ask questions and all were answered. The patient agreed with the plan and demonstrated an understanding of the instructions.   Advised to call back or seek an in-person evaluation if worsening  or having  further concerns . Return in about 24 weeks (around 03/22/2021) for preventive /cpx and medications lab work previsit.Berniece Andreas, MD

## 2020-10-05 ENCOUNTER — Other Ambulatory Visit: Payer: Self-pay

## 2020-10-05 ENCOUNTER — Encounter: Payer: Self-pay | Admitting: Internal Medicine

## 2020-10-05 ENCOUNTER — Telehealth (INDEPENDENT_AMBULATORY_CARE_PROVIDER_SITE_OTHER): Payer: No Typology Code available for payment source | Admitting: Internal Medicine

## 2020-10-05 VITALS — Temp 98.6°F | Ht 67.0 in | Wt 200.0 lb

## 2020-10-05 DIAGNOSIS — F902 Attention-deficit hyperactivity disorder, combined type: Secondary | ICD-10-CM

## 2020-10-05 DIAGNOSIS — R12 Heartburn: Secondary | ICD-10-CM

## 2020-10-05 DIAGNOSIS — Z720 Tobacco use: Secondary | ICD-10-CM

## 2020-10-05 DIAGNOSIS — Z79899 Other long term (current) drug therapy: Secondary | ICD-10-CM | POA: Diagnosis not present

## 2020-10-05 DIAGNOSIS — R7301 Impaired fasting glucose: Secondary | ICD-10-CM

## 2020-10-05 DIAGNOSIS — E785 Hyperlipidemia, unspecified: Secondary | ICD-10-CM

## 2020-10-05 DIAGNOSIS — G4733 Obstructive sleep apnea (adult) (pediatric): Secondary | ICD-10-CM | POA: Diagnosis not present

## 2020-10-05 MED ORDER — FAMOTIDINE 20 MG PO TABS: 20.0000 mg | ORAL_TABLET | Freq: Two times a day (BID) | ORAL | 2 refills | Status: DC

## 2020-10-30 ENCOUNTER — Other Ambulatory Visit: Payer: Self-pay | Admitting: Internal Medicine

## 2020-10-31 ENCOUNTER — Other Ambulatory Visit: Payer: Self-pay | Admitting: Internal Medicine

## 2020-10-31 DIAGNOSIS — Z1231 Encounter for screening mammogram for malignant neoplasm of breast: Secondary | ICD-10-CM

## 2020-11-01 ENCOUNTER — Other Ambulatory Visit: Payer: Self-pay | Admitting: Internal Medicine

## 2020-11-01 NOTE — Telephone Encounter (Signed)
Pt call and want a refill on amphetamine-dextroamphetamine (ADDERALL XR) 20 MG 24 hr capsule sent [facility] [place], [place] - [address] Phone:  [phone]  Fax:  [phone]      to

## 2020-11-03 MED ORDER — AMPHETAMINE-DEXTROAMPHET ER 20 MG PO CP24
40.0000 mg | ORAL_CAPSULE | Freq: Every day | ORAL | 0 refills | Status: DC
Start: 2020-11-03 — End: 2020-12-06

## 2020-12-06 ENCOUNTER — Other Ambulatory Visit: Payer: Self-pay | Admitting: Internal Medicine

## 2020-12-06 NOTE — Telephone Encounter (Signed)
Pt is calling to see if she can get a refill on Rx amphetamine-dextroamphetamine (ADDERALL XR) 20 MG Pharm:  [facility] [address]

## 2020-12-07 ENCOUNTER — Other Ambulatory Visit: Payer: Self-pay

## 2020-12-07 ENCOUNTER — Ambulatory Visit
Admission: RE | Admit: 2020-12-07 | Discharge: 2020-12-07 | Disposition: A | Discharge status: Home / Self Care | Payer: No Typology Code available for payment source | Source: Ambulatory Visit | Attending: Internal Medicine | Admitting: Internal Medicine

## 2020-12-07 DIAGNOSIS — Z1231 Encounter for screening mammogram for malignant neoplasm of breast: Secondary | ICD-10-CM

## 2020-12-07 IMAGING — MG DIGITAL SCREENING BILAT W/ TOMO W/ CAD
8 series · 8 of 24 positions shown · non-contrast
Comparison: Previous exam(s).

CLINICAL DATA: Screening.

EXAM:
DIGITAL SCREENING BILATERAL MAMMOGRAM WITH TOMO AND CAD

[R MLO synth-2D]
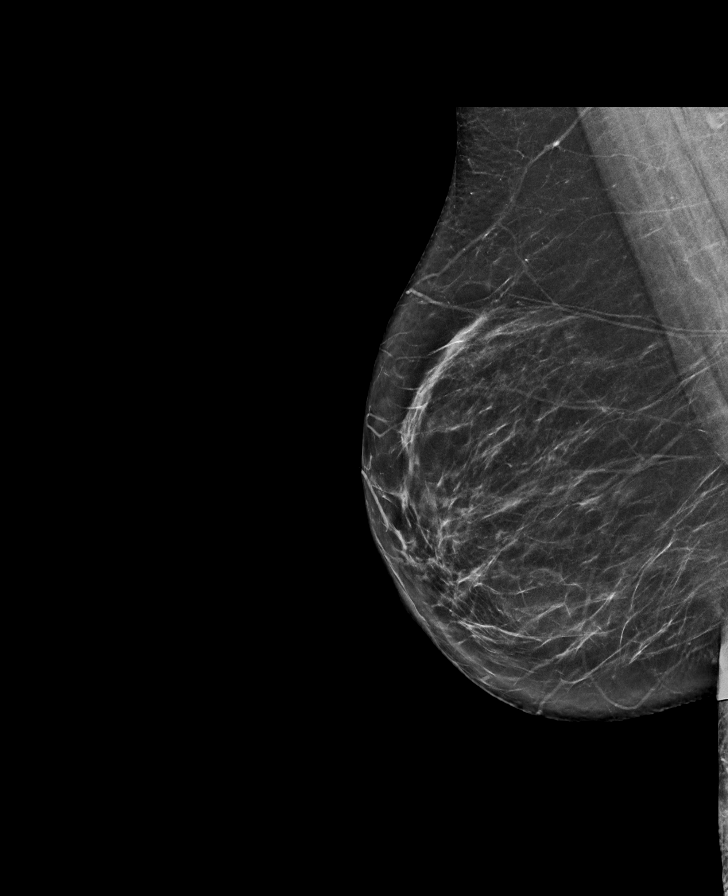

[R CC synth-2D]
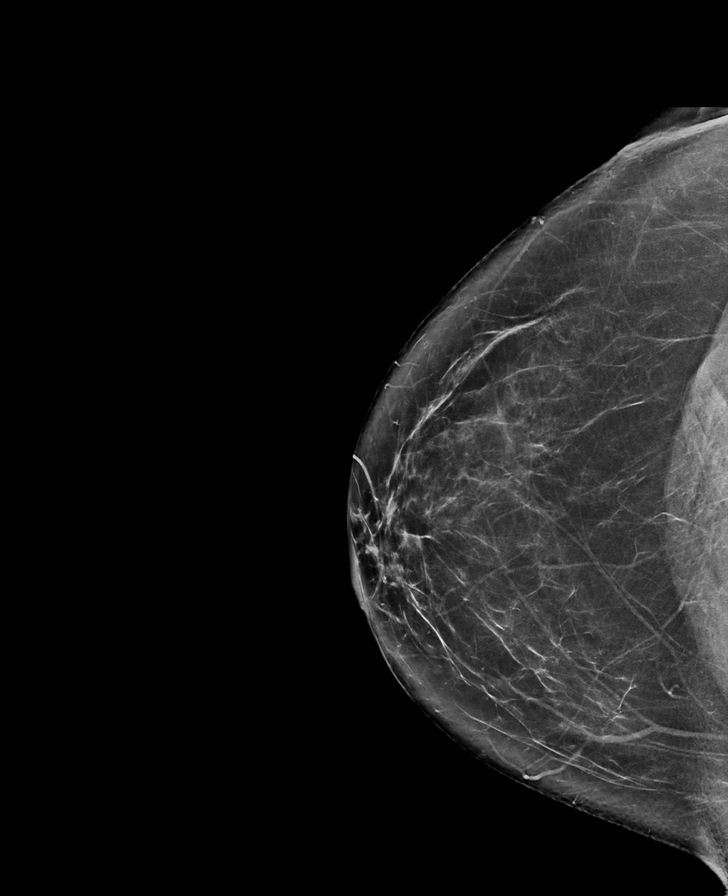

[L CC synth-2D]
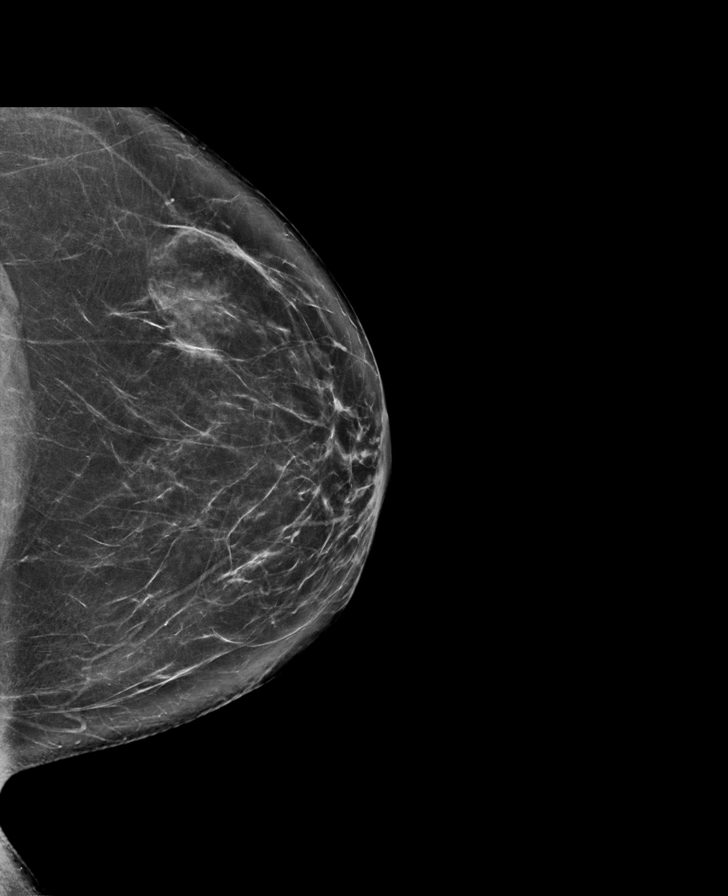

[L MLO synth-2D]
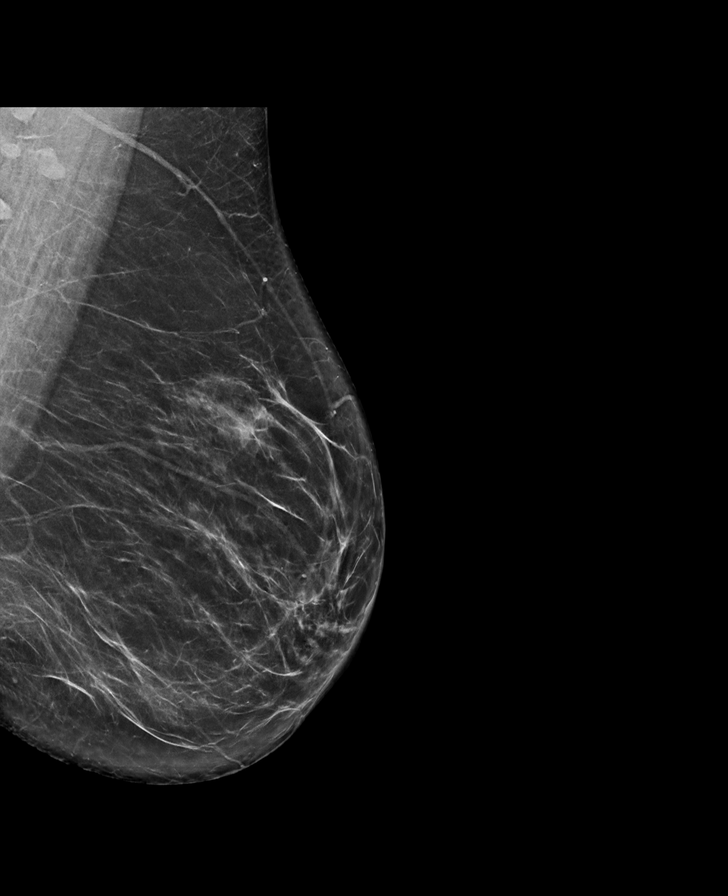

[R CC tomo · tomo slice 39/77.0]
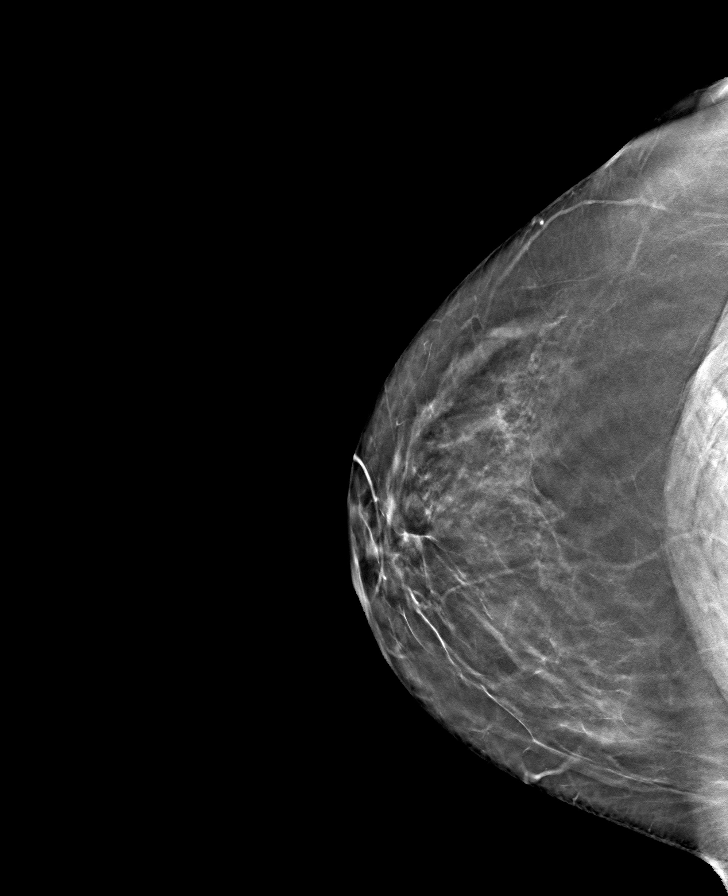

[L CC tomo · tomo slice 39/76.0]
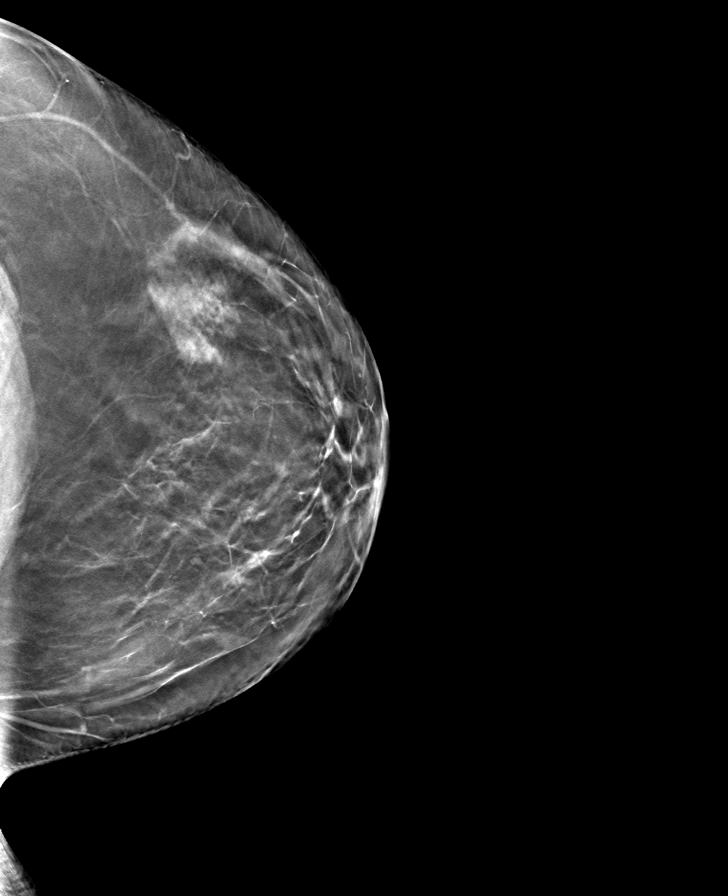

[L MLO tomo · tomo slice 39/78.0]
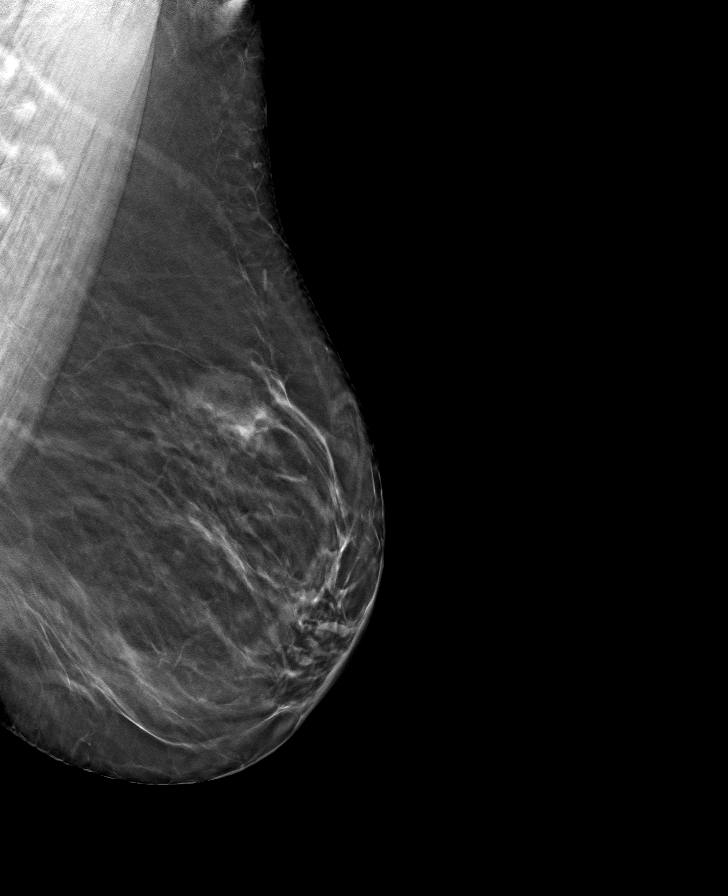

[R MLO tomo · tomo slice 39/77.0]
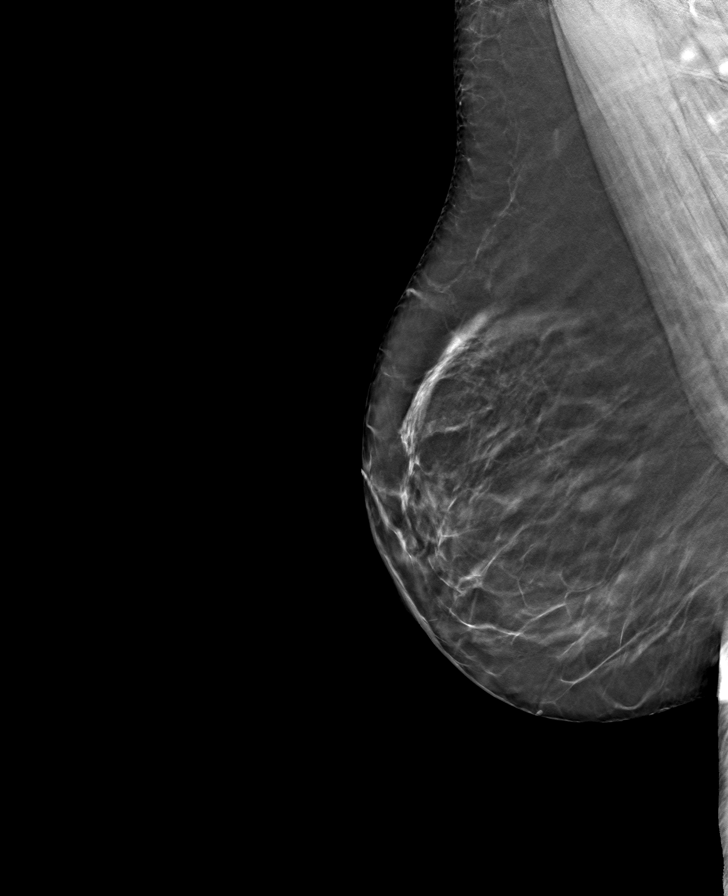

[8 of 24 positions shown; findings below may reference images not displayed]

ACR Breast Density Category b: There are scattered areas of
fibroglandular density.
FINDINGS: There are no findings suspicious for malignancy. Images were
processed with CAD.
IMPRESSION: No mammographic evidence of malignancy. A result letter of this
screening mammogram will be mailed directly to the patient.

RECOMMENDATION:
Screening mammogram in one year. (Code:[id number])

BI-RADS CATEGORY  1: Negative.

## 2020-12-07 MED ORDER — AMPHETAMINE-DEXTROAMPHET ER 20 MG PO CP24
40.0000 mg | ORAL_CAPSULE | Freq: Every day | ORAL | 0 refills | Status: DC
Start: 2020-12-07 — End: 2021-01-24

## 2020-12-29 ENCOUNTER — Other Ambulatory Visit: Payer: Self-pay | Admitting: Internal Medicine

## 2021-01-24 ENCOUNTER — Telehealth: Payer: Self-pay | Admitting: Internal Medicine

## 2021-01-24 MED ORDER — AMPHETAMINE-DEXTROAMPHET ER 20 MG PO CP24
40.0000 mg | ORAL_CAPSULE | Freq: Every day | ORAL | 0 refills | Status: DC
Start: 2021-01-24 — End: 2021-03-16

## 2021-01-24 NOTE — Telephone Encounter (Signed)
Pt is calling in needing a refill on Rx amphetamine-dextroamphetamine (ADDERALL XR) 20 MG  [facility] [address]

## 2021-01-24 NOTE — Telephone Encounter (Signed)
Last office visit-  10/05/2020 Last refill---12/07/2020---60 tabs no refills

## 2021-01-24 NOTE — Telephone Encounter (Signed)
Sent in electronically .  

## 2021-01-27 ENCOUNTER — Other Ambulatory Visit: Payer: Self-pay | Admitting: Internal Medicine

## 2021-01-28 ENCOUNTER — Other Ambulatory Visit: Payer: Self-pay | Admitting: Internal Medicine

## 2021-02-27 ENCOUNTER — Other Ambulatory Visit: Payer: Self-pay | Admitting: Internal Medicine

## 2021-02-27 NOTE — Telephone Encounter (Signed)
Refill request for:  Atorvastin 80  Mg LR 08/31/20,  #15, 5 rfs  Fluoxetine 20 mg LR 12/29/20, #60, 1 rf LOV 10/05/20 FOV   None scheduled.  Please review and advise.  Thanks.  Dm/cma

## 2021-02-28 NOTE — Telephone Encounter (Signed)
Patient scheduled for lab on 3/8 @ 8:20 am. Dm/cma

## 2021-03-07 ENCOUNTER — Other Ambulatory Visit: Payer: No Typology Code available for payment source

## 2021-03-13 ENCOUNTER — Other Ambulatory Visit: Payer: No Typology Code available for payment source

## 2021-03-13 ENCOUNTER — Other Ambulatory Visit: Payer: Self-pay

## 2021-03-13 DIAGNOSIS — R7301 Impaired fasting glucose: Secondary | ICD-10-CM

## 2021-03-13 DIAGNOSIS — G4733 Obstructive sleep apnea (adult) (pediatric): Secondary | ICD-10-CM

## 2021-03-13 DIAGNOSIS — F902 Attention-deficit hyperactivity disorder, combined type: Secondary | ICD-10-CM

## 2021-03-13 DIAGNOSIS — E785 Hyperlipidemia, unspecified: Secondary | ICD-10-CM

## 2021-03-13 DIAGNOSIS — Z79899 Other long term (current) drug therapy: Secondary | ICD-10-CM

## 2021-03-13 DIAGNOSIS — Z720 Tobacco use: Secondary | ICD-10-CM

## 2021-03-13 DIAGNOSIS — R12 Heartburn: Secondary | ICD-10-CM

## 2021-03-14 LAB — HEPATIC FUNCTION PANEL
AG Ratio: 2 (calc) (ref 1.0–2.5)
ALT: 17 U/L (ref 6–29)
AST: 17 U/L (ref 10–35)
Albumin: 4.5 g/dL (ref 3.6–5.1)
Alkaline phosphatase (APISO): 85 U/L (ref 37–153)
Bilirubin, Direct: 0.1 mg/dL (ref 0.0–0.2)
Globulin: 2.3 g/dL (calc) (ref 1.9–3.7)
Indirect Bilirubin: 0.1 mg/dL (calc) — ABNORMAL LOW (ref 0.2–1.2)
Total Bilirubin: 0.2 mg/dL (ref 0.2–1.2)
Total Protein: 6.8 g/dL (ref 6.1–8.1)

## 2021-03-14 LAB — BASIC METABOLIC PANEL WITH GFR
BUN: 17 mg/dL (ref 7–25)
CO2: 25 mmol/L (ref 20–32)
Calcium: 9.4 mg/dL (ref 8.6–10.4)
Chloride: 104 mmol/L (ref 98–110)
Creat: 0.94 mg/dL (ref 0.50–1.05)
GFR, Est African American: 79 mL/min/{1.73_m2} (ref >=60)
GFR, Est Non African American: 68 mL/min/{1.73_m2} (ref >=60)
Glucose, Bld: 108 mg/dL — ABNORMAL HIGH (ref 65–99)
Potassium: 4.2 mmol/L (ref 3.5–5.3)
Sodium: 141 mmol/L (ref 135–146)

## 2021-03-14 LAB — LIPID PANEL
Cholesterol: 190 mg/dL (ref <200)
HDL: 55 mg/dL (ref >=50)
LDL Cholesterol (Calc): 105 mg/dL (calc) — ABNORMAL HIGH
Non-HDL Cholesterol (Calc): 135 mg/dL (calc) — ABNORMAL HIGH (ref <130)
Total CHOL/HDL Ratio: 3.5 (calc) (ref <5.0)
Triglycerides: 178 mg/dL — ABNORMAL HIGH (ref <150)

## 2021-03-14 LAB — CBC WITH DIFFERENTIAL/PLATELET
Absolute Monocytes: 521 cells/uL (ref 200–950)
Basophils Absolute: 31 cells/uL (ref 0–200)
Basophils Relative: 0.5 %
Eosinophils Absolute: 322 cells/uL (ref 15–500)
Eosinophils Relative: 5.2 %
HCT: 43.9 % (ref 35.0–45.0)
Hemoglobin: 14.5 g/dL (ref 11.7–15.5)
Lymphs Abs: 2226 cells/uL (ref 850–3900)
MCH: 29.2 pg (ref 27.0–33.0)
MCHC: 33 g/dL (ref 32.0–36.0)
MCV: 88.5 fL (ref 80.0–100.0)
MPV: 9.8 fL (ref 7.5–12.5)
Monocytes Relative: 8.4 %
Neutro Abs: 3100 cells/uL (ref 1500–7800)
Neutrophils Relative %: 50 %
Platelets: 289 10*3/uL (ref 140–400)
RBC: 4.96 10*6/uL (ref 3.80–5.10)
RDW: 13.1 % (ref 11.0–15.0)
Total Lymphocyte: 35.9 %
WBC: 6.2 10*3/uL (ref 3.8–10.8)

## 2021-03-14 LAB — HEMOGLOBIN A1C
Hgb A1c MFr Bld: 6 % of total Hgb — ABNORMAL HIGH (ref <5.7)
Mean Plasma Glucose: 126 mg/dL
eAG (mmol/L): 7 mmol/L

## 2021-03-14 LAB — TSH: TSH: 7.4 mIU/L — ABNORMAL HIGH (ref 0.40–4.50)

## 2021-03-14 NOTE — Progress Notes (Signed)
Thjyroid is off  blood sugar in borderline elevated range . Make appt for CPX  this month as planned and we will  address at visit

## 2021-03-16 ENCOUNTER — Telehealth: Payer: Self-pay | Admitting: Internal Medicine

## 2021-03-16 MED ORDER — AMPHETAMINE-DEXTROAMPHET ER 20 MG PO CP24
40.0000 mg | ORAL_CAPSULE | Freq: Every day | ORAL | 0 refills | Status: DC
Start: 2021-03-16 — End: 2021-05-06

## 2021-03-16 NOTE — Telephone Encounter (Signed)
Noted  

## 2021-03-16 NOTE — Telephone Encounter (Signed)
Pt call and stated she need a refill on her amphetamine-dextroamphetamine (ADDERALL XR) 20 MG 24 hr capsule sent to  [facility] [place], [place] - [address] Phone:  [phone]  Fax:  [phone]

## 2021-03-16 NOTE — Telephone Encounter (Signed)
Sent in electronically .  

## 2021-04-10 NOTE — Progress Notes (Signed)
Chief Complaint  Patient presents with  . Annual Exam  . Medication Management    HPI: Patient  Victoria Carter  56 y.o. comes in today for Preventive Health Care visit  And med check for multiple medical conditions.  Thyroid doing ok has been taking it regularly recently at times may have missed it difficult taking all her medicines in the correct time meeting.  ADHD sometimes forgets adderall at times and notices Seems to be more depressed recently so increased her Prozac to 3 x 20 mg a day may have some help this not in counseling at this time is thinking about going back.  Some homelessness no suicidality lots of stress.  Working 60 hours a week less sleep during the week but sleeps in a lot on the weekends.  May be 1-2 alcoholic beverages an evening during the week.  Weight has gone back up somewhat with below 200 in the fall. Still using occasional tobacco but not every day  Family history of osteoporosis asks about calcium vitamin D. Health Maintenance  Topic Date Due  . INFLUENZA VACCINE  07/31/2021  . TETANUS/TDAP  06/11/2022  . COLONOSCOPY (Pts [id number] Insurance coverage will need to be confirmed)  11/14/2022  . MAMMOGRAM  12/07/2022  . PAP SMEAR-Modifier  03/12/2023  . COVID-19 Vaccine  Completed  . Hepatitis C Screening  Completed  . HIV Screening  Completed  . HPV VACCINES  Aged Out   Health Maintenance Review LIFESTYLE:  Exercise:   Not as much    Depression trying  Tobacco/ETS:  Off and on .  Alcohol:  1-2  After work.  Weekend sleeps.  Sugar beverages:  Relapsing to diet coke  Sleep: all or none.   Drug use: no HH of   1 + dog divorced Work: remote      At least 60    ROS:  GEN/ HEENT: No fever, significant weight changes sweats headaches vision problems hearing changes, CV/ PULM; No chest pain shortness of breath cough, syncope,edema  change in exercise tolerance. GI /GU: No adominal pain, vomiting, change in bowel habits. No blood in the stool. No  significant GU symptoms. SKIN/HEME: ,no acute skin rashes suspicious lesions or bleeding. No lymphadenopathy, nodules, masses.  NEURO/ PSYCH:  No neurologic signs such as weakness numbness. No depression anxiety. IMM/ Allergy: No unusual infections.  Allergy .   REST of 12 system review negative except as per HPI   Past Medical History:  Diagnosis Date  . Allergic rhinitis   . Depression   . Hx of abnormal cervical Pap smear    used cryo when first married and pregnant  . Hyperglycemia   . Hyperlipidemia   . Sinusitis   . UTI (lower urinary tract infection)     Past Surgical History:  Procedure Laterality Date  . BREAST BIOPSY  1987  . BREAST EXCISIONAL BIOPSY Right   . CERVICAL BIOPSY    . CESAREAN SECTION      Family History  Problem Relation Age of Onset  . Atrial fibrillation Mother   . Heart attack Brother        age 57  . Diabetes type II Other   . Hyperlipidemia Other   . Factor V Leiden deficiency Daughter        Also husband    Social History   Socioeconomic History  . Marital status: Unknown    Spouse name: Not on file  . Number of children: Not on file  . Years  of education: Not on file  . Highest education level: Not on file  Occupational History  . Not on file  Tobacco Use  . Smoking status: Current Some Day Smoker    Packs/day: 0.50    Years: 10.00    Pack years: 5.00    Types: Cigarettes  . Smokeless tobacco: Never Used  Vaping Use  . Vaping Use: Never used  Substance and Sexual Activity  . Alcohol use: Yes    Alcohol/week: 1.0 standard drink    Types: 1 Glasses of wine per week  . Drug use: No  . Sexual activity: Not on file  Other Topics Concern  . Not on file  Social History Narrative   Occupation: 40 per week 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  [employer]  In stress position    Married now separated  04-18-17  Divorced    Regular exercise- yes  yoga   HH of 1-  girls home from college   Pet dogs passed away 04-18-2016   G2P2   Social Determinants of Health    Financial Resource Strain: Not on file  Food Insecurity: Not on file  Transportation Needs: Not on file  Physical Activity: Not on file  Stress: Not on file  Social Connections: Not on file    Outpatient Medications Prior to Visit  Medication Sig Dispense Refill  . amphetamine-dextroamphetamine (ADDERALL XR) 20 MG 24 hr capsule Take 2 capsules (40 mg total) by mouth daily. [employer] 60 capsule 0  . atorvastatin (LIPITOR) 80 MG tablet Take 0.5 tablets (40 mg total) by mouth daily. Lab tests needed before further refills 15 tablet 1  . Calcium 500 MG CHEW 1 chewable tablet daily 30 tablet 6  . famotidine (PEPCID) 20 MG tablet Take 1 tablet (20 mg total) by mouth 2 (two) times daily. 60 tablet 2  . FLUoxetine (PROZAC) 20 MG capsule TAKE TWO CAPSULES BY MOUTH DAILY 60 capsule 1  . levothyroxine (SYNTHROID) 50 MCG tablet TAKE ONE TABLET BY MOUTH DAILY 30 tablet 3  . Multiple Vitamins-Minerals ([employer] WOMENS DAILY GUMMIES) CHEW Chew 2 Units by mouth daily. 60 tablet 6  . acetaminophen (TYLENOL) 500 MG tablet Take 2 tablets (1,000 mg total) by mouth every 6 (six) hours as needed for mild pain or moderate pain. 60 tablet 6   No facility-administered medications prior to visit.     EXAM:  BP 130/70 (BP Location: Left Arm, Patient Position: Sitting, Cuff Size: Large)   Pulse 84   Temp 98.2 F (36.8 C) (Oral)   Ht 5\' 7"  (1.702 m)   Wt 225 lb 6.4 oz (102.2 kg)   LMP 04/15/2012   SpO2 96%   BMI 35.30 kg/m   Body mass index is 35.3 kg/m. Wt Readings from Last 3 Encounters:  04/11/21 225 lb 6.4 oz (102.2 kg)  10/05/20 200 lb (90.7 kg)  03/11/20 218 lb 3.2 oz (99 kg)    Physical Exam: Vital signs reviewed 05/11/20 is a well-developed well-nourished alert cooperative    who appearsr stated age in no acute distress.  HEENT: normocephalic atraumatic , Eyes: PERRL EOM's full, conjunctiva clear, Nares: paten,t no deformity discharge or tenderness., Ears: NECK: supple without masses,  thyromegaly or bruits. CHEST/PULM:  Clear to auscultation and percussion breath sounds equal no wheeze , rales or rhonchi. No chest wall deformities or tenderness. Breast: normal by inspection . No dimpling, discharge, masses, tenderness or discharge . CV: PMI is nondisplaced, S1 S2 no gallops, murmurs, rubs. Peripheral pulses are full  without delay.No JVD .  ABDOMEN: Bowel sounds normal nontender  No guard or rebound, no hepato splenomegal no CVA tenderness.   Extremtities:  No clubbing cyanosis or edema, no acute joint swelling or redness no focal atrophy NEURO:  Oriented x3, cranial nerves 3-12 appear to be intact, no obvious focal weakness,gait within normal limits no abnormal reflexes or asymmetrical increased motor activity but normal speech and cognition SKIN: No acute rashes normal turgor, color, no bruising or petechiae. PSYCH: Oriented, good eye contact, no obvious depression anxiety, cognition and judgment appear normal. LN: no cervical axillary inguinal adenopathy  Lab Results  Component Value Date   WBC 6.2 03/13/2021   HGB 14.5 03/13/2021   HCT 43.9 03/13/2021   PLT 289 03/13/2021   GLUCOSE 108 (H) 03/13/2021   CHOL 190 03/13/2021   TRIG 178 (H) 03/13/2021   HDL 55 03/13/2021   LDLDIRECT 88.6 11/01/2014   LDLCALC 105 (H) 03/13/2021   ALT 17 03/13/2021   AST 17 03/13/2021   NA 141 03/13/2021   K 4.2 03/13/2021   CL 104 03/13/2021   CREATININE 0.94 03/13/2021   BUN 17 03/13/2021   CO2 25 03/13/2021   TSH 5.62 (H) 04/11/2021   INR 0.9 07/30/2019   HGBA1C 6.0 (H) 03/13/2021    BP Readings from Last 3 Encounters:  04/11/21 130/70  03/11/20 122/76  01/27/19 122/78    Lab results reviewed with patient   ASSESSMENT AND PLAN:  Discussed the following assessment and plan:    ICD-10-CM   1. Routine general medical examination at a health care facility  Z00.00   2. Medication management  Z79.899   3. Attention deficit hyperactivity disorder (ADHD), combined type   F90.2   4. Hyperlipidemia, unspecified hyperlipidemia type  E78.5   5. Fasting hyperglycemia  R73.01   6. Hypothyroidism, unspecified type  E03.9 TSH    T4, free    T4, free    TSH  7. Pre-diabetes  R73.03   8. Tobacco use  Z72.0   Partially mood in part but partially ADHD shows chaos of her schedule which is affecting her health. Discussion about above and how medicine is taken can repeat her TSH today has been taking regularly for at least a month. Look into counselor that can be online add Wellbutrin 150 24 hours augmentation to her 40 mg of fluoxetine she seemed to have responded to this in the remote past.  Regular sleep time and hygiene may be helpful. counseled  Can do Combined calcium vitamin D to make it easier many preparations whatever is easiest. Work on getting the weight back down in a healthy manner. If all seems overwhelming discussed organizational techniques and strategies with a counselor. Can addresses recap  the multiple issues at next visit  To ensure going in right direction  Return for 6-8 weeks  med check virtual ok .  Patient Care Team: Austen Wygant, Neta Mends, MD as PCP - Juanna Cao, MD (Dermatology) Charna Elizabeth, MD as Consulting Physician (Gastroenterology) Vilinda Flake, PhD as Consulting Physician (Psychology) Jodi Geralds, MD as Consulting Physician (Orthopedic Surgery) Patient Instructions   Repeat thyroid today .   Pre diabetic   Sugar .   Counseling  May help.    Make a list of task   So you dont have to remember  Your goals.  Sleep regular times may help Consider meds on weekend  Will add wellbutrin  To  he 40 mg or prozac   Plan rov  televisit  is ok in  About 6-8 weeks  Or as needed.     Health Maintenance, Female Adopting a healthy lifestyle and getting preventive care are important in promoting health and wellness. Ask your health care provider about:  The right schedule for you to have regular tests and exams.  Things you can  do on your own to prevent diseases and keep yourself healthy. What should I know about diet, weight, and exercise? Eat a healthy diet  Eat a diet that includes plenty of vegetables, fruits, low-fat dairy products, and lean protein.  Do not eat a lot of foods that are high in solid fats, added sugars, or sodium.   Maintain a healthy weight Body mass index (BMI) is used to identify weight problems. It estimates body fat based on height and weight. Your health care provider can help determine your BMI and help you achieve or maintain a healthy weight. Get regular exercise Get regular exercise. This is one of the most important things you can do for your health. Most adults should:  Exercise for at least 150 minutes each week. The exercise should increase your heart rate and make you sweat (moderate-intensity exercise).  Do strengthening exercises at least twice a week. This is in addition to the moderate-intensity exercise.  Spend less time sitting. Even light physical activity can be beneficial. Watch cholesterol and blood lipids Have your blood tested for lipids and cholesterol at 57 years of age, then have this test every 5 years. Have your cholesterol levels checked more often if:  Your lipid or cholesterol levels are high.  You are older than 57 years of age.  You are at high risk for heart disease. What should I know about cancer screening? Depending on your health history and family history, you may need to have cancer screening at various ages. This may include screening for:  Breast cancer.  Cervical cancer.  Colorectal cancer.  Skin cancer.  Lung cancer. What should I know about heart disease, diabetes, and high blood pressure? Blood pressure and heart disease  High blood pressure causes heart disease and increases the risk of stroke. This is more likely to develop in people who have high blood pressure readings, are of African descent, or are overweight.  Have your  blood pressure checked: ? Every 3-5 years if you are 41-7 years of age. ? Every year if you are 2 years old or older. Diabetes Have regular diabetes screenings. This checks your fasting blood sugar level. Have the screening done:  Once every three years after age 39 if you are at a normal weight and have a low risk for diabetes.  More often and at a younger age if you are overweight or have a high risk for diabetes. What should I know about preventing infection? Hepatitis B If you have a higher risk for hepatitis B, you should be screened for this virus. Talk with your health care provider to find out if you are at risk for hepatitis B infection. Hepatitis C Testing is recommended for:  Everyone born from [birth year] through [birth year].  Anyone with known risk factors for hepatitis C. Sexually transmitted infections (STIs)  Get screened for STIs, including gonorrhea and chlamydia, if: ? You are sexually active and are younger than 57 years of age. ? You are older than 57 years of age and your health care provider tells you that you are at risk for this type of infection. ? Your sexual activity has changed since you were last  screened, and you are at increased risk for chlamydia or gonorrhea. Ask your health care provider if you are at risk.  Ask your health care provider about whether you are at high risk for HIV. Your health care provider may recommend a prescription medicine to help prevent HIV infection. If you choose to take medicine to prevent HIV, you should first get tested for HIV. You should then be tested every 3 months for as long as you are taking the medicine. Pregnancy  If you are about to stop having your period (premenopausal) and you may become pregnant, seek counseling before you get pregnant.  Take 400 to 800 micrograms (mcg) of folic acid every day if you become pregnant.  Ask for birth control (contraception) if you want to prevent pregnancy. Osteoporosis and  menopause Osteoporosis is a disease in which the bones lose minerals and strength with aging. This can result in bone fractures. If you are 33 years old or older, or if you are at risk for osteoporosis and fractures, ask your health care provider if you should:  Be screened for bone loss.  Take a calcium or vitamin D supplement to lower your risk of fractures.  Be Prien hormone replacement therapy (HRT) to treat symptoms of menopause. Follow these instructions at home: Lifestyle  Do not use any products that contain nicotine or tobacco, such as cigarettes, e-cigarettes, and chewing tobacco. If you need help quitting, ask your health care provider.  Do not use street drugs.  Do not share needles.  Ask your health care provider for help if you need support or information about quitting drugs. Alcohol use  Do not drink alcohol if: ? Your health care provider tells you not to drink. ? You are pregnant, may be pregnant, or are planning to become pregnant.  If you drink alcohol: ? Limit how much you use to 0-1 drink a day. ? Limit intake if you are breastfeeding.  Be aware of how much alcohol is in your drink. In the [place] one drink equals one 12 oz bottle of beer (355 mL), one 5 oz glass of wine (148 mL), or one 1 oz glass of hard liquor (44 mL). General instructions  Schedule regular health, dental, and eye exams.  Stay current with your vaccines.  Tell your health care provider if: ? You often feel depressed. ? You have ever been abused or do not feel safe at home. Summary  Adopting a healthy lifestyle and getting preventive care are important in promoting health and wellness.  Follow your health care provider's instructions about healthy diet, exercising, and getting tested or screened for diseases.  Follow your health care provider's instructions on monitoring your cholesterol and blood pressure. This information is not intended to replace advice [place] to you by your  health care provider. Make sure you discuss any questions you have with your health care provider. Document Revised: 12/10/2018 Document Reviewed: 12/10/2018 Elsevier Patient Education  2021 [employer].          [place]. Anaih Brander M.D.

## 2021-04-11 ENCOUNTER — Ambulatory Visit (INDEPENDENT_AMBULATORY_CARE_PROVIDER_SITE_OTHER): Payer: No Typology Code available for payment source | Admitting: Internal Medicine

## 2021-04-11 ENCOUNTER — Encounter: Payer: Self-pay | Admitting: Internal Medicine

## 2021-04-11 ENCOUNTER — Other Ambulatory Visit: Payer: Self-pay

## 2021-04-11 VITALS — BP 130/70 | HR 84 | Temp 98.2°F | Ht 67.0 in | Wt 225.4 lb

## 2021-04-11 DIAGNOSIS — Z720 Tobacco use: Secondary | ICD-10-CM

## 2021-04-11 DIAGNOSIS — Z0000 Encounter for general adult medical examination without abnormal findings: Secondary | ICD-10-CM

## 2021-04-11 DIAGNOSIS — R7301 Impaired fasting glucose: Secondary | ICD-10-CM | POA: Diagnosis not present

## 2021-04-11 DIAGNOSIS — F902 Attention-deficit hyperactivity disorder, combined type: Secondary | ICD-10-CM

## 2021-04-11 DIAGNOSIS — E785 Hyperlipidemia, unspecified: Secondary | ICD-10-CM

## 2021-04-11 DIAGNOSIS — Z79899 Other long term (current) drug therapy: Secondary | ICD-10-CM | POA: Diagnosis not present

## 2021-04-11 DIAGNOSIS — R7303 Prediabetes: Secondary | ICD-10-CM

## 2021-04-11 DIAGNOSIS — E039 Hypothyroidism, unspecified: Secondary | ICD-10-CM

## 2021-04-11 LAB — TSH: TSH: 5.62 u[IU]/mL — ABNORMAL HIGH (ref 0.35–4.50)

## 2021-04-11 LAB — T4, FREE: Free T4: 0.93 ng/dL (ref 0.60–1.60)

## 2021-04-11 MED ORDER — BUPROPION HCL ER (XL) 150 MG PO TB24: 150.0000 mg | ORAL_TABLET | Freq: Every day | ORAL | 3 refills | Status: DC

## 2021-04-11 NOTE — Patient Instructions (Addendum)
Repeat thyroid today .   Pre diabetic   Sugar .   Counseling  May help.    Make a list of task   So you dont have to remember  Your goals.  Sleep regular times may help Consider meds on weekend  Will add wellbutrin  To  he 40 mg or prozac   Plan rov  televisit is ok in  About 6-8 weeks  Or as needed.     Health Maintenance, Female Adopting a healthy lifestyle and getting preventive care are important in promoting health and wellness. Ask your health care provider about:  The right schedule for you to have regular tests and exams.  Things you can do on your own to prevent diseases and keep yourself healthy. What should I know about diet, weight, and exercise? Eat a healthy diet  Eat a diet that includes plenty of vegetables, fruits, low-fat dairy products, and lean protein.  Do not eat a lot of foods that are high in solid fats, added sugars, or sodium.   Maintain a healthy weight Body mass index (BMI) is used to identify weight problems. It estimates body fat based on height and weight. Your health care provider can help determine your BMI and help you achieve or maintain a healthy weight. Get regular exercise Get regular exercise. This is one of the most important things you can do for your health. Most adults should:  Exercise for at least 150 minutes each week. The exercise should increase your heart rate and make you sweat (moderate-intensity exercise).  Do strengthening exercises at least twice a week. This is in addition to the moderate-intensity exercise.  Spend less time sitting. Even light physical activity can be beneficial. Watch cholesterol and blood lipids Have your blood tested for lipids and cholesterol at 57 years of age, then have this test every 5 years. Have your cholesterol levels checked more often if:  Your lipid or cholesterol levels are high.  You are older than 57 years of age.  You are at high risk for heart disease. What should I know about  cancer screening? Depending on your health history and family history, you may need to have cancer screening at various ages. This may include screening for:  Breast cancer.  Cervical cancer.  Colorectal cancer.  Skin cancer.  Lung cancer. What should I know about heart disease, diabetes, and high blood pressure? Blood pressure and heart disease  High blood pressure causes heart disease and increases the risk of stroke. This is more likely to develop in people who have high blood pressure readings, are of African descent, or are overweight.  Have your blood pressure checked: ? Every 3-5 years if you are 23-29 years of age. ? Every year if you are 28 years old or older. Diabetes Have regular diabetes screenings. This checks your fasting blood sugar level. Have the screening done:  Once every three years after age 11 if you are at a normal weight and have a low risk for diabetes.  More often and at a younger age if you are overweight or have a high risk for diabetes. What should I know about preventing infection? Hepatitis B If you have a higher risk for hepatitis B, you should be screened for this virus. Talk with your health care provider to find out if you are at risk for hepatitis B infection. Hepatitis C Testing is recommended for:  Everyone born from [birth year] through [birth year].  Anyone with known risk factors for hepatitis  C. Sexually transmitted infections (STIs)  Get screened for STIs, including gonorrhea and chlamydia, if: ? You are sexually active and are younger than 57 years of age. ? You are older than 57 years of age and your health care provider tells you that you are at risk for this type of infection. ? Your sexual activity has changed since you were last screened, and you are at increased risk for chlamydia or gonorrhea. Ask your health care provider if you are at risk.  Ask your health care provider about whether you are at high risk for HIV. Your health care  provider may recommend a prescription medicine to help prevent HIV infection. If you choose to take medicine to prevent HIV, you should first get tested for HIV. You should then be tested every 3 months for as long as you are taking the medicine. Pregnancy  If you are about to stop having your period (premenopausal) and you may become pregnant, seek counseling before you get pregnant.  Take 400 to 800 micrograms (mcg) of folic acid every day if you become pregnant.  Ask for birth control (contraception) if you want to prevent pregnancy. Osteoporosis and menopause Osteoporosis is a disease in which the bones lose minerals and strength with aging. This can result in bone fractures. If you are 92 years old or older, or if you are at risk for osteoporosis and fractures, ask your health care provider if you should:  Be screened for bone loss.  Take a calcium or vitamin D supplement to lower your risk of fractures.  Be Sliney hormone replacement therapy (HRT) to treat symptoms of menopause. Follow these instructions at home: Lifestyle  Do not use any products that contain nicotine or tobacco, such as cigarettes, e-cigarettes, and chewing tobacco. If you need help quitting, ask your health care provider.  Do not use street drugs.  Do not share needles.  Ask your health care provider for help if you need support or information about quitting drugs. Alcohol use  Do not drink alcohol if: ? Your health care provider tells you not to drink. ? You are pregnant, may be pregnant, or are planning to become pregnant.  If you drink alcohol: ? Limit how much you use to 0-1 drink a day. ? Limit intake if you are breastfeeding.  Be aware of how much alcohol is in your drink. In the [place] one drink equals one 12 oz bottle of beer (355 mL), one 5 oz glass of wine (148 mL), or one 1 oz glass of hard liquor (44 mL). General instructions  Schedule regular health, dental, and eye exams.  Stay current  with your vaccines.  Tell your health care provider if: ? You often feel depressed. ? You have ever been abused or do not feel safe at home. Summary  Adopting a healthy lifestyle and getting preventive care are important in promoting health and wellness.  Follow your health care provider's instructions about healthy diet, exercising, and getting tested or screened for diseases.  Follow your health care provider's instructions on monitoring your cholesterol and blood pressure. This information is not intended to replace advice [place] to you by your health care provider. Make sure you discuss any questions you have with your health care provider. Document Revised: 12/10/2018 Document Reviewed: 12/10/2018 Elsevier Patient Education  2021 [employer].

## 2021-04-11 NOTE — Progress Notes (Signed)
Improved but  still off   plan  Stop the 50 mcg levothyroxine   begin  75 mcg levothyroxine   1 po qd  . Disp 90 refill x 1   Plan check tsh  in 3 months or as needed

## 2021-04-12 MED ORDER — LEVOTHYROXINE SODIUM 75 MCG PO TABS: 75.0000 ug | ORAL_TABLET | Freq: Every day | ORAL | 1 refills | Status: DC

## 2021-04-12 NOTE — Addendum Note (Signed)
Addended by: Christy Sartorius on: 04/12/2021 02:16 PM   Modules accepted: Orders

## 2021-04-12 NOTE — Addendum Note (Signed)
Addended by: Christy Sartorius on: 04/12/2021 02:02 PM   Modules accepted: Orders

## 2021-05-04 ENCOUNTER — Telehealth: Payer: Self-pay | Admitting: Internal Medicine

## 2021-05-04 NOTE — Telephone Encounter (Signed)
Last refill- 03/16/21-60 tabs, 0 refills Last office visit- 04/11/21  No future visit scheduled

## 2021-05-04 NOTE — Telephone Encounter (Signed)
Pt is calling in needing a refill on Rx amphetamine-dextroamphetamine (ADDERALL XR) 20 MG  [facility] [address]

## 2021-05-06 MED ORDER — AMPHETAMINE-DEXTROAMPHET ER 20 MG PO CP24: 40.0000 mg | ORAL_CAPSULE | Freq: Every day | ORAL | 0 refills | Status: DC

## 2021-05-06 NOTE — Addendum Note (Signed)
Addended byMadelin Headings on: 05/06/2021 08:55 AM   Modules accepted: Orders

## 2021-05-06 NOTE — Telephone Encounter (Signed)
Sent in electronically .  

## 2021-05-07 ENCOUNTER — Other Ambulatory Visit: Payer: Self-pay | Admitting: Internal Medicine

## 2021-06-06 ENCOUNTER — Other Ambulatory Visit: Payer: Self-pay | Admitting: Internal Medicine

## 2021-06-07 ENCOUNTER — Telehealth: Payer: Self-pay | Admitting: Internal Medicine

## 2021-06-07 MED ORDER — AMPHETAMINE-DEXTROAMPHET ER 20 MG PO CP24: 40.0000 mg | ORAL_CAPSULE | Freq: Every day | ORAL | 0 refills | Status: DC

## 2021-06-07 NOTE — Addendum Note (Signed)
Addended by: Gershon Crane A on: 06/07/2021 04:42 PM   Modules accepted: Orders

## 2021-06-07 NOTE — Telephone Encounter (Signed)
Dr. Fabian Sharp patient  Last refill- 05/06/21-60 cap no refills Last office- 10/05/20  No future visit has been scheduled

## 2021-06-07 NOTE — Telephone Encounter (Signed)
Done

## 2021-06-07 NOTE — Telephone Encounter (Signed)
Pt is calling in to get a refill on Rx amphetamine-dextroamphetamine (ADDERALL XR) 20 MG  [facility] [address].  Pt is aware that her provider is not in the office this week and we will try to get someone to assist [place] with this.

## 2021-06-08 NOTE — Telephone Encounter (Signed)
LVM for patient, prescription has been sent to pharmacy

## 2021-07-10 ENCOUNTER — Other Ambulatory Visit: Payer: Self-pay

## 2021-07-11 ENCOUNTER — Other Ambulatory Visit (INDEPENDENT_AMBULATORY_CARE_PROVIDER_SITE_OTHER): Payer: No Typology Code available for payment source

## 2021-07-11 DIAGNOSIS — Z0000 Encounter for general adult medical examination without abnormal findings: Secondary | ICD-10-CM | POA: Diagnosis not present

## 2021-07-11 DIAGNOSIS — E039 Hypothyroidism, unspecified: Secondary | ICD-10-CM

## 2021-07-11 LAB — TSH: TSH: 3.65 u[IU]/mL (ref 0.35–5.50)

## 2021-07-11 NOTE — Progress Notes (Signed)
Thyroid tests now in normal range

## 2021-08-01 ENCOUNTER — Telehealth: Payer: Self-pay | Admitting: Internal Medicine

## 2021-08-01 MED ORDER — AMPHETAMINE-DEXTROAMPHET ER 20 MG PO CP24: 40.0000 mg | ORAL_CAPSULE | Freq: Every day | ORAL | 0 refills | Status: DC

## 2021-08-01 NOTE — Telephone Encounter (Signed)
Sent in electronically .  

## 2021-08-01 NOTE — Telephone Encounter (Signed)
Noted  

## 2021-08-01 NOTE — Telephone Encounter (Signed)
Pt call and stated she need a refill on amphetamine-dextroamphetamine (ADDERALL XR) 20 MG 24 hr capsule sent to  [facility] [id number] - [place], [place] - [address] Phone:  [phone]  Fax:  [phone]     

## 2021-08-18 ENCOUNTER — Other Ambulatory Visit: Payer: Self-pay | Admitting: Internal Medicine

## 2021-08-18 ENCOUNTER — Telehealth: Payer: Self-pay

## 2021-08-18 MED ORDER — ATORVASTATIN CALCIUM 80 MG PO TABS: ORAL_TABLET | ORAL | 1 refills | Status: DC

## 2021-08-18 NOTE — Telephone Encounter (Signed)
RX sent to pts pharmacy.  

## 2021-08-18 NOTE — Telephone Encounter (Signed)
Patient called requesting refills atorvastatin (LIPITOR) 80 MG tablet patient has appt scheduled 8/22 for Labs

## 2021-08-18 NOTE — Addendum Note (Signed)
Addended by: Christy Sartorius on: 08/18/2021 08:56 AM   Modules accepted: Orders

## 2021-08-21 ENCOUNTER — Other Ambulatory Visit: Payer: Self-pay

## 2021-08-21 ENCOUNTER — Other Ambulatory Visit: Payer: No Typology Code available for payment source

## 2021-08-21 ENCOUNTER — Telehealth: Payer: Self-pay | Admitting: Internal Medicine

## 2021-08-21 DIAGNOSIS — Z0000 Encounter for general adult medical examination without abnormal findings: Secondary | ICD-10-CM

## 2021-08-21 DIAGNOSIS — E785 Hyperlipidemia, unspecified: Secondary | ICD-10-CM

## 2021-08-21 LAB — LIPID PANEL
Cholesterol: 183 mg/dL (ref 0–200)
HDL: 44.3 mg/dL (ref >39.00)
LDL Cholesterol: 120 mg/dL — ABNORMAL HIGH (ref 0–99)
NonHDL: 138.29
Total CHOL/HDL Ratio: 4
Triglycerides: 93 mg/dL (ref 0.0–149.0)
VLDL: 18.6 mg/dL (ref 0.0–40.0)

## 2021-08-21 LAB — HEMOGLOBIN A1C: Hgb A1c MFr Bld: 5.9 % (ref 4.6–6.5)

## 2021-08-22 NOTE — Progress Notes (Signed)
Hemoglobin A1c slightly better Triglycerides are improved otherwise cholesterol stable. Continue lifestyle attention and medication atorvastatin and levothyroxine.

## 2021-08-29 NOTE — Telephone Encounter (Signed)
Uncertain why this encounter opened

## 2021-09-11 ENCOUNTER — Telehealth: Payer: Self-pay

## 2021-09-11 MED ORDER — AMPHETAMINE-DEXTROAMPHET ER 20 MG PO CP24: 40.0000 mg | ORAL_CAPSULE | Freq: Every day | ORAL | 0 refills | Status: DC

## 2021-09-11 NOTE — Telephone Encounter (Signed)
Patient called requesting Rx refill for  amphetamine-dextroamphetamine (ADDERALL XR) 20 MG 24 hr capsule

## 2021-09-11 NOTE — Telephone Encounter (Signed)
Sent in electronically .  

## 2021-09-11 NOTE — Addendum Note (Signed)
Addended byMadelin Headings on: 09/11/2021 06:39 PM   Modules accepted: Orders

## 2021-10-09 ENCOUNTER — Other Ambulatory Visit: Payer: Self-pay | Admitting: Internal Medicine

## 2021-10-17 ENCOUNTER — Telehealth: Payer: Self-pay | Admitting: Internal Medicine

## 2021-10-17 NOTE — Telephone Encounter (Signed)
Pt is calling and need a refill on adderall xr 20 mg [facility] [address] phone number [phone]

## 2021-10-18 ENCOUNTER — Other Ambulatory Visit: Payer: Self-pay | Admitting: Internal Medicine

## 2021-10-18 MED ORDER — AMPHETAMINE-DEXTROAMPHET ER 20 MG PO CP24: 40.0000 mg | ORAL_CAPSULE | Freq: Every day | ORAL | 0 refills | Status: DC

## 2021-10-18 NOTE — Telephone Encounter (Signed)
Make her an appointment for med check that is needed before her next refill.  It is been 6 months.

## 2021-10-19 NOTE — Telephone Encounter (Signed)
Left a message for the pt to return my call.  

## 2021-11-05 NOTE — Progress Notes (Signed)
Chief Complaint  Patient presents with   Medication Refill     HPI: Victoria Carter 56 y.o. come in for Chronic disease management  med evaluation ADHD :meds helps for work .  No sig se  on weekend  takes none or one . And has less focused.   Sleep :  more at least 7 hours    HLD 40 mg  1/2 of 80 mg ok to  change to 40 per day  Thyroid  daily  adherence  Mood  dog needed spine surgery  other issues .    Doing ok on Wellbutrin  ? Dec prozac dose taking 40 per day  Tad   tobacco  during week . 1/2 ppd , etoh   ocassional   activity  works from home  not recently.  ROS: See pertinent positives and negatives per HPI. Works from Copywriter, advertising   Past Medical History:  Diagnosis Date   Allergic rhinitis    Depression    Hx of abnormal cervical Pap smear    used cryo when first married and pregnant   Hyperglycemia    Hyperlipidemia    Sinusitis    UTI (lower urinary tract infection)     Family History  Problem Relation Age of Onset   Atrial fibrillation Mother    Heart attack Brother        age 24   Diabetes type II Other    Hyperlipidemia Other    Factor V Leiden deficiency Daughter        Also husband    Social History   Socioeconomic History   Marital status: Unknown    Spouse name: Not on file   Number of children: Not on file   Years of education: Not on file   Highest education level: Not on file  Occupational History   Not on file  Tobacco Use   Smoking status: Some Days    Packs/day: 0.50    Years: 10.00    Pack years: 5.00    Types: Cigarettes   Smokeless tobacco: Never  Vaping Use   Vaping Use: Never used  Substance and Sexual Activity   Alcohol use: Yes    Alcohol/week: 1.0 standard drink    Types: 1 Glasses of wine per week   Drug use: No   Sexual activity: Not on file  Other Topics Concern   Not on file  Social History Narrative   Occupation: 40 per week 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  [employer]  In stress position    Married now separated  11-Apr-2017  Divorced     Regular exercise- yes  yoga   HH of 1-  girls home from college   Pet dogs passed away 04-11-16   G2P2   Social Determinants of Health   Financial Resource Strain: Not on file  Food Insecurity: Not on file  Transportation Needs: Not on file  Physical Activity: Not on file  Stress: Not on file  Social Connections: Not on file    Outpatient Medications Prior to Visit  Medication Sig Dispense Refill   amphetamine-dextroamphetamine (ADDERALL XR) 20 MG 24 hr capsule Take 2 capsules (40 mg total) by mouth daily. [employer] 60 capsule 0   buPROPion (WELLBUTRIN XL) 150 MG 24 hr tablet TAKE ONE TABLET BY MOUTH DAILY 90 tablet 1   Calcium 500 MG CHEW 1 chewable tablet daily 30 tablet 6   famotidine (PEPCID) 20 MG tablet TAKE ONE TABLET BY MOUTH TWICE A DAY  180 tablet 1   levothyroxine (SYNTHROID) 75 MCG tablet TAKE ONE TABLET BY MOUTH EVERY MORNING BEFORE BREAKFAST 90 tablet 0   Multiple Vitamins-Minerals ([employer] W[employer]ENS DAILY GUMMIES) CHEW Chew 2 Units by mouth daily. 60 tablet 6   atorvastatin (LIPITOR) 80 MG tablet TAKE 1/2 TABLET DAILY .Marland KitchenLAB TESTS NEEDED  BEFORE FURTHER REFILLS 30 tablet 1   FLUoxetine (PROZAC) 20 MG capsule TAKE TWO CAPSULES BY MOUTH DAILY 60 capsule 1   No facility-administered medications prior to visit.     EXAM:  BP 130/60 (BP Location: Left Arm, Patient Position: Sitting, Cuff Size: Normal)   Pulse 77   Temp 98.4 F (36.9 C) (Oral)   Ht 5\' 7"  (1.702 m)   Wt 223 lb 6.4 oz (101.3 kg)   LMP 04/15/2012   SpO2 96%   BMI 34.99 kg/m   Body mass index is 34.99 kg/m.  GENERAL: vitals reviewed and listed above, alert, oriented, appears well hydrated and in no acute distress HEENT: atraumatic, conjunctiva  clear, no obvious abnormalities on inspection of external nose and ears OP : masked  NECK: no obvious masses on inspection palpation  LUNGS: clear to auscultation bilaterally, no wheezes, rales or rhonchi, good air movement CV: HRRR, no clubbing cyanosis or   peripheral edema nl cap refill  MS: moves all extremities without noticeable focal  abnormality PSYCH: pleasant and cooperative, no obvious depression or anxiety Lab Results  Component Value Date   WBC 6.2 03/13/2021   HGB 14.5 03/13/2021   HCT 43.9 03/13/2021   PLT 289 03/13/2021   GLUCOSE 108 (H) 03/13/2021   CHOL 183 08/21/2021   TRIG 93.0 08/21/2021   HDL 44.30 08/21/2021   LDLDIRECT 88.6 11/01/2014   LDLCALC 120 (H) 08/21/2021   ALT 17 03/13/2021   AST 17 03/13/2021   NA 141 03/13/2021   K 4.2 03/13/2021   CL 104 03/13/2021   CREATININE 0.94 03/13/2021   BUN 17 03/13/2021   CO2 25 03/13/2021   TSH 3.65 07/11/2021   INR 0.9 07/30/2019   HGBA1C 5.9 08/21/2021   BP Readings from Last 3 Encounters:  11/06/21 130/60  04/11/21 130/70  03/11/20 122/76    ASSESSMENT AND PLAN:  Discussed the following assessment and plan:  Attention deficit hyperactivity disorder (ADHD), combined type  Medication management  OSA (obstructive sleep apnea)  Tobacco use  Hyperlipidemia, unspecified hyperlipidemia type  Hypothyroidism, unspecified type 6 mos  cpx and labs   Plan trial wean fluoxetine to 20 per day   try qod  if needed we can add 10 mg  to help .  No change in other meds  at this  time  Disc stop tobacco and lose weight  Mood is stable continue wellbutrin  has been helpful .  -Patient advised to return or notify health care team  if  new concerns arise.  Patient Instructions  Good  to see you today .   No change in meds .   Plan  5-6 mos med check cpx and lab pre visit .  Work on stopping tobacco .  Can try weaning  fluoxetine to   every other day 20 alt 40 mg  over 1-2 months and then stay on  20 mg per day.  Will send in refill  atorva 40 mg  .   05/11/20. Luzelena Heeg M.D.

## 2021-11-06 ENCOUNTER — Encounter: Payer: Self-pay | Admitting: Internal Medicine

## 2021-11-06 ENCOUNTER — Other Ambulatory Visit: Payer: Self-pay

## 2021-11-06 ENCOUNTER — Ambulatory Visit: Payer: No Typology Code available for payment source | Admitting: Internal Medicine

## 2021-11-06 VITALS — BP 130/60 | HR 77 | Temp 98.4°F | Ht 67.0 in | Wt 223.4 lb

## 2021-11-06 DIAGNOSIS — F902 Attention-deficit hyperactivity disorder, combined type: Secondary | ICD-10-CM

## 2021-11-06 DIAGNOSIS — E785 Hyperlipidemia, unspecified: Secondary | ICD-10-CM

## 2021-11-06 DIAGNOSIS — Z79899 Other long term (current) drug therapy: Secondary | ICD-10-CM

## 2021-11-06 DIAGNOSIS — G4733 Obstructive sleep apnea (adult) (pediatric): Secondary | ICD-10-CM

## 2021-11-06 DIAGNOSIS — E782 Mixed hyperlipidemia: Secondary | ICD-10-CM

## 2021-11-06 DIAGNOSIS — R7301 Impaired fasting glucose: Secondary | ICD-10-CM

## 2021-11-06 DIAGNOSIS — Z720 Tobacco use: Secondary | ICD-10-CM | POA: Diagnosis not present

## 2021-11-06 DIAGNOSIS — E039 Hypothyroidism, unspecified: Secondary | ICD-10-CM

## 2021-11-06 MED ORDER — FLUOXETINE HCL 20 MG PO CAPS: 40.0000 mg | ORAL_CAPSULE | Freq: Every day | ORAL | 1 refills | Status: DC

## 2021-11-06 MED ORDER — ATORVASTATIN CALCIUM 40 MG PO TABS: 40.0000 mg | ORAL_TABLET | Freq: Every day | ORAL | 1 refills | Status: DC

## 2021-11-06 NOTE — Patient Instructions (Signed)
Good  to see you today .   No change in meds .   Plan  5-6 mos med check cpx and lab pre visit .  Work on stopping tobacco .  Can try weaning  fluoxetine to   every other day 20 alt 40 mg  over 1-2 months and then stay on  20 mg per day.  Will send in refill  atorva 40 mg  .

## 2021-11-10 ENCOUNTER — Other Ambulatory Visit: Payer: Self-pay | Admitting: Internal Medicine

## 2021-11-10 DIAGNOSIS — Z1231 Encounter for screening mammogram for malignant neoplasm of breast: Secondary | ICD-10-CM

## 2021-11-30 ENCOUNTER — Telehealth: Payer: Self-pay | Admitting: Internal Medicine

## 2021-11-30 NOTE — Telephone Encounter (Signed)
Pt is calling and would like refill on amphetamine-dextroamphetamine (ADDERALL XR) 20 MG 24 hr capsule sent to   [facility] [id number] - [place], [place] - [address] Phone:  [phone]  Fax:  [phone]

## 2021-12-05 MED ORDER — AMPHETAMINE-DEXTROAMPHET ER 20 MG PO CP24: 40.0000 mg | ORAL_CAPSULE | Freq: Every day | ORAL | 0 refills | Status: DC

## 2021-12-14 ENCOUNTER — Ambulatory Visit
Admission: RE | Admit: 2021-12-14 | Discharge: 2021-12-14 | Disposition: A | Discharge status: Home / Self Care | Payer: No Typology Code available for payment source | Source: Ambulatory Visit | Attending: Internal Medicine | Admitting: Internal Medicine

## 2021-12-14 DIAGNOSIS — Z1231 Encounter for screening mammogram for malignant neoplasm of breast: Secondary | ICD-10-CM

## 2021-12-14 IMAGING — MG MM DIGITAL SCREENING BILAT W/ TOMO AND CAD
8 series · 8 of 24 positions shown · non-contrast
Comparison: Previous exam(s).

CLINICAL DATA: Screening.

EXAM:
DIGITAL SCREENING BILATERAL MAMMOGRAM WITH TOMOSYNTHESIS AND CAD
TECHNIQUE: Bilateral screening digital craniocaudal and mediolateral oblique
mammograms were obtained. Bilateral screening digital breast
tomosynthesis was performed. The images were evaluated with
computer-aided detection.

[R MLO synth-2D]
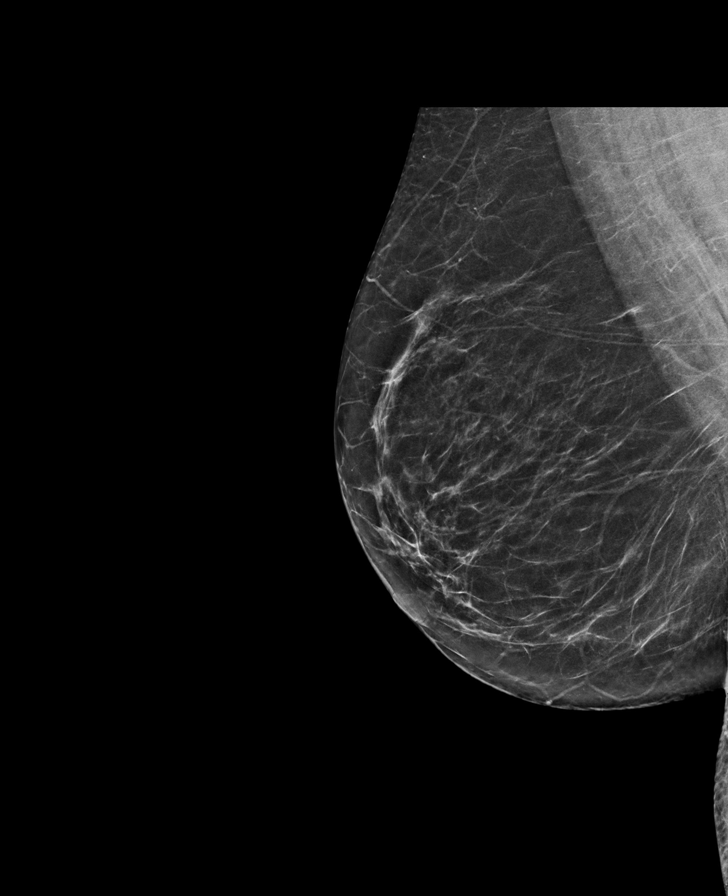

[R CC synth-2D]
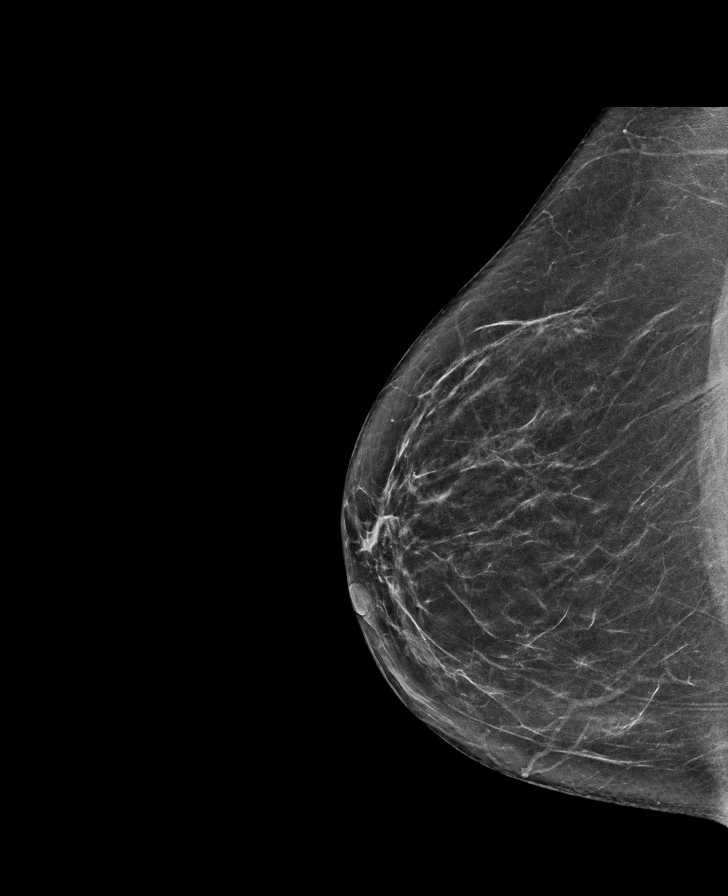

[L CC synth-2D]
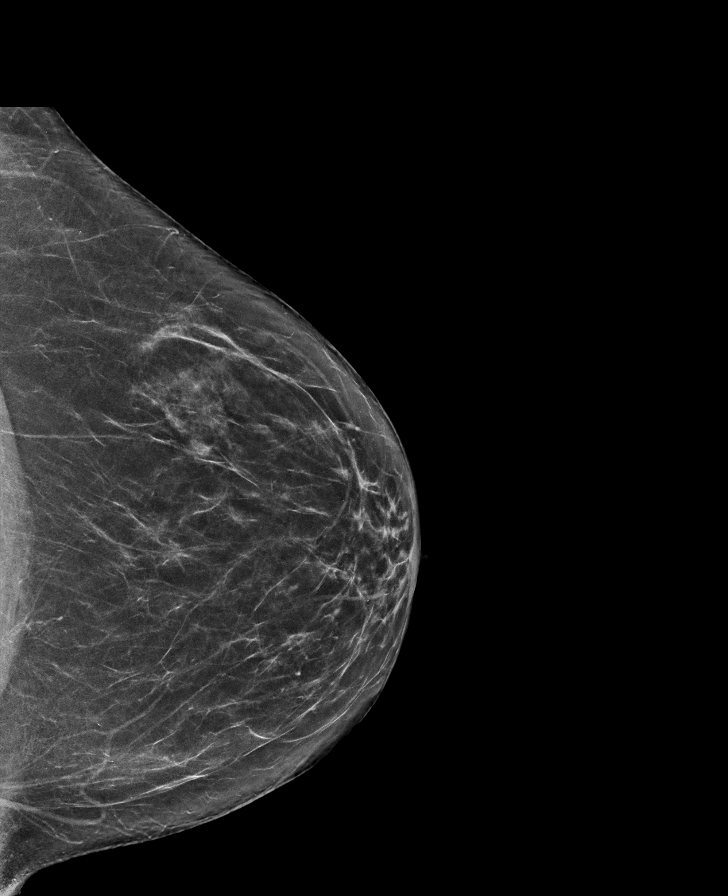

[L MLO synth-2D]
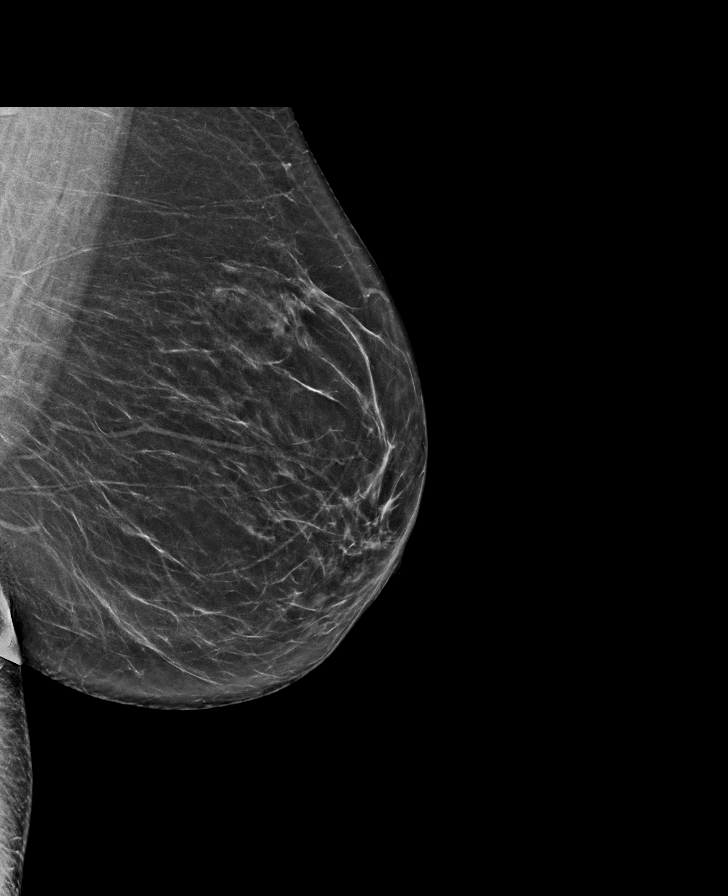

[L MLO tomo · tomo slice 35/68.0]
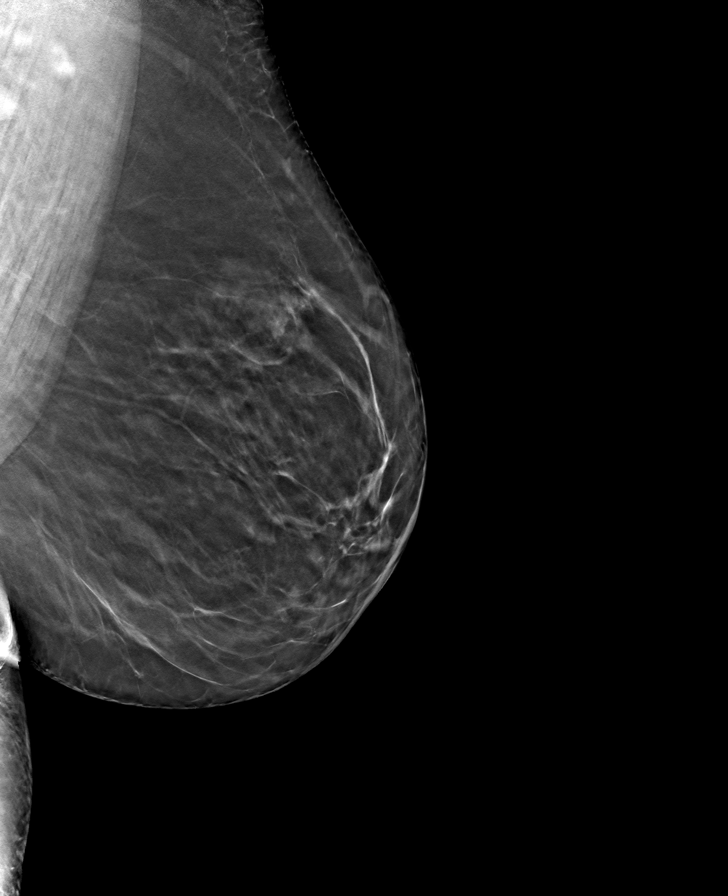

[R MLO tomo · tomo slice 35/68.0]
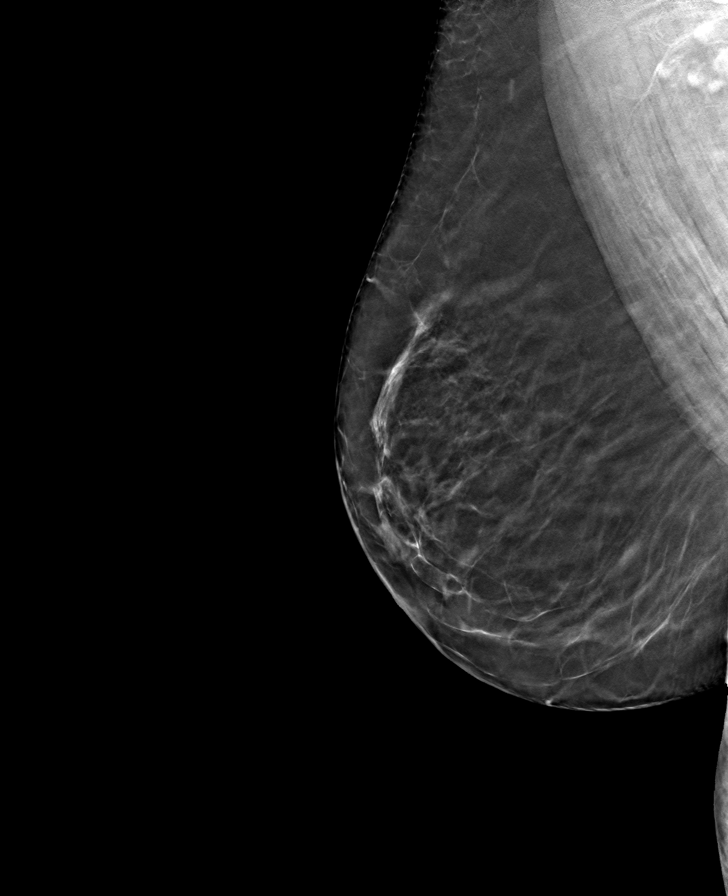

[L CC tomo · tomo slice 33/66.0]
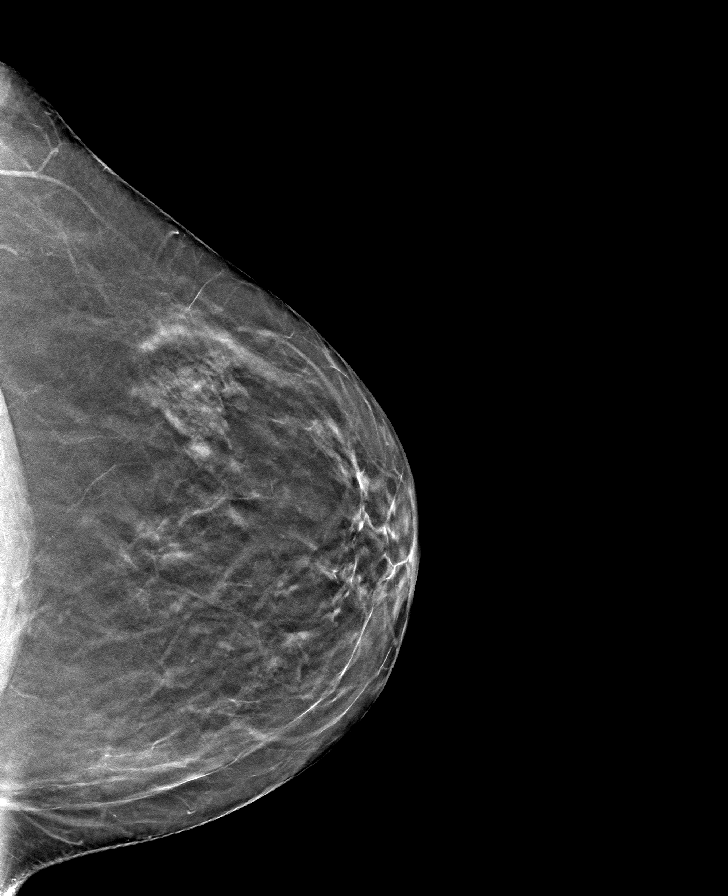

[R CC tomo · tomo slice 35/70.0]
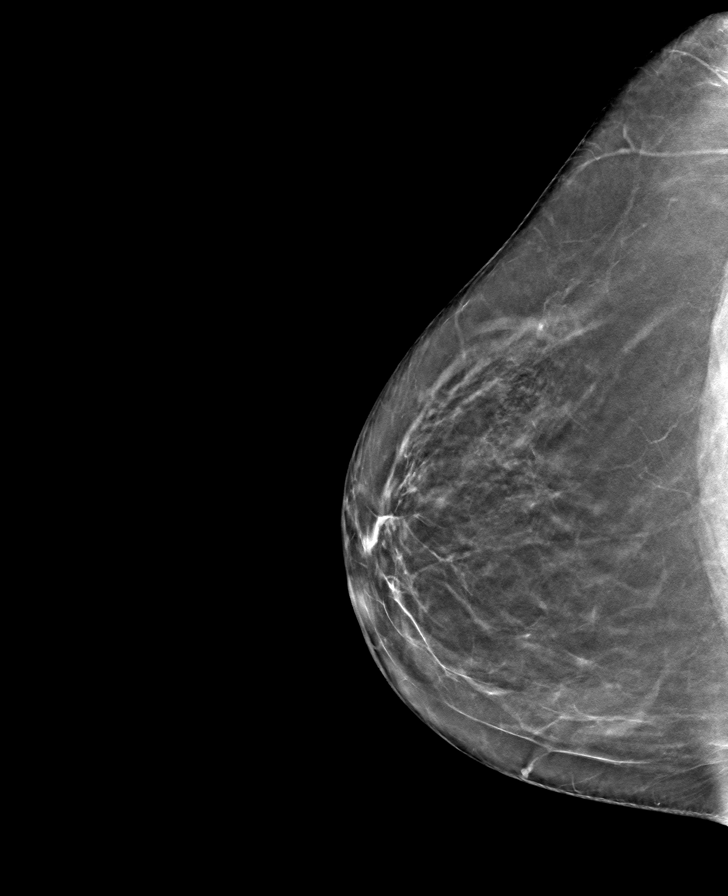

[8 of 24 positions shown; findings below may reference images not displayed]

ACR Breast Density Category b: There are scattered areas of
fibroglandular density.
FINDINGS: There are no findings suspicious for malignancy.
IMPRESSION: No mammographic evidence of malignancy. A result letter of this
screening mammogram will be mailed directly to the patient.

RECOMMENDATION:
Screening mammogram in one year. (Code:51-O-LD2)

BI-RADS CATEGORY  1: Negative.

## 2022-01-09 ENCOUNTER — Telehealth: Payer: Self-pay | Admitting: Internal Medicine

## 2022-01-09 MED ORDER — AMPHETAMINE-DEXTROAMPHET ER 20 MG PO CP24: 40.0000 mg | ORAL_CAPSULE | Freq: Every day | ORAL | 0 refills | Status: DC

## 2022-01-09 NOTE — Telephone Encounter (Signed)
Pt call and stated she need a refill on amphetamine-dextroamphetamine (ADDERALL XR) 20 MG 24 hr capsule sent to  [facility] [id number] - [place], [place] - [address] Phone:  [phone]  Fax:  [phone]     

## 2022-01-09 NOTE — Telephone Encounter (Signed)
Sent in electronically .  

## 2022-01-09 NOTE — Telephone Encounter (Signed)
Ok to refill  last Ov 11/06/21

## 2022-01-17 ENCOUNTER — Other Ambulatory Visit: Payer: Self-pay | Admitting: Internal Medicine

## 2022-02-06 NOTE — Progress Notes (Signed)
Chief Complaint  Patient presents with   Tinnitus    Noticed Oct of 22    HPI: Victoria Willetts 57 y.o. come in for  nre problem  ringing in ears  high pitched .  Ringing that is gotten worse over time  As of last year has noticed prominent   and now increasing more recently without obvious trigger.  Even with background noise it does not distort anymore or block it out. She thinks it is bilateral nonpulsatile and not associated with other loss of function dizziness or a specific medicine that she can say.  Thinks that her hearing is always been off a little but was never fully tested.  No remote history of significant sound problem She does use tobacco has cut back on caffeine and is on a stimulant medicine for her ADHD but this has been a while.  Her mom in her 87s is hard of hearing but does not get hearing aid Her dad had hearing loss from barotrauma from war and ruptured eardrum. A living sibling has no known problem.  Suggested that she see her clinical team to evaluate.  ROS: See pertinent positives and negatives per HPI. She has left shoulder degenerative disease and is to have a shoulder replacement at the end of the year Dr. Ave Filter. He is right-hand dominant. Has young grandchild needs updated Tdap Past Medical History:  Diagnosis Date   Allergic rhinitis    Depression    Hx of abnormal cervical Pap smear    used cryo when first married and pregnant   Hyperglycemia    Hyperlipidemia    Sinusitis    UTI (lower urinary tract infection)     Family History  Problem Relation Age of Onset   Atrial fibrillation Mother    Heart attack Brother        age 66   Diabetes type II Other    Hyperlipidemia Other    Factor V Leiden deficiency Daughter        Also husband    Social History   Socioeconomic History   Marital status: Unknown    Spouse name: Not on file   Number of children: Not on file   Years of education: Not on file   Highest education level: Some  college, no degree  Occupational History   Not on file  Tobacco Use   Smoking status: Some Days    Packs/day: 0.50    Years: 10.00    Pack years: 5.00    Types: Cigarettes   Smokeless tobacco: Never  Vaping Use   Vaping Use: Never used  Substance and Sexual Activity   Alcohol use: Yes    Alcohol/week: 1.0 standard drink    Types: 1 Glasses of wine per week   Drug use: No   Sexual activity: Not on file  Other Topics Concern   Not on file  Social History Narrative   Occupation: 40 per week 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  [employer]  In stress position    Married now separated  05/08/17  Divorced    Regular exercise- yes  yoga   HH of 1-  girls home from college   Pet dogs passed away 05-08-2016   G2P2   Social Determinants of Health   Financial Resource Strain: Low Risk    Difficulty of Paying Living Expenses: Not very hard  Food Insecurity: No Food Insecurity   Worried About Running Out of Food in the Last Year: Never true   Ran  Out of Food in the Last Year: Never true  Transportation Needs: No Transportation Needs   Lack of Transportation (Medical): No   Lack of Transportation (Non-Medical): No  Physical Activity: Insufficiently Active   Days of Exercise per Week: 3 days   Minutes of Exercise per Session: 30 min  Stress: Stress Concern Present   Feeling of Stress : Very much  Social Connections: Unknown   Frequency of Communication with Friends and Family: Once a week   Frequency of Social Gatherings with Friends and Family: Patient refused   Attends Religious Services: Patient refused   Database administrator or Organizations: No   Attends Engineer, structural: Not on file   Marital Status: Divorced    Outpatient Medications Prior to Visit  Medication Sig Dispense Refill   amphetamine-dextroamphetamine (ADDERALL XR) 20 MG 24 hr capsule Take 2 capsules (40 mg total) by mouth daily. [employer] 60 capsule 0   atorvastatin (LIPITOR) 40 MG tablet Take 1 tablet (40 mg total) by mouth  daily. 90 tablet 1   buPROPion (WELLBUTRIN XL) 150 MG 24 hr tablet TAKE ONE TABLET BY MOUTH DAILY 90 tablet 1   Calcium 500 MG CHEW 1 chewable tablet daily 30 tablet 6   famotidine (PEPCID) 20 MG tablet TAKE ONE TABLET BY MOUTH TWICE A DAY 180 tablet 1   FLUoxetine (PROZAC) 20 MG capsule Take 2 capsules (40 mg total) by mouth daily. 180 capsule 1   levothyroxine (SYNTHROID) 75 MCG tablet TAKE ONE TABLET BY MOUTH EVERY MORNING BEFORE BREAKFAST 90 tablet 2   Multiple Vitamins-Minerals ([employer] WOMENS DAILY GUMMIES) CHEW Chew 2 Units by mouth daily. 60 tablet 6   No facility-administered medications prior to visit.     EXAM:  BP 126/82 (BP Location: Left Arm, Patient Position: Sitting, Cuff Size: Normal)    Pulse 95    Temp 98.9 F (37.2 C) (Oral)    Ht 5\' 7"  (1.702 m)    Wt 220 lb (99.8 kg)    LMP 04/15/2012    SpO2 99%    BMI 34.46 kg/m   Body mass index is 34.46 kg/m.  GENERAL: vitals reviewed and listed above, alert, oriented, appears well hydrated and in no acute distress HEENT: atraumatic, conjunctiva  clear, no obvious abnormalities on inspection of external nose and ears right and left EAC patent TM intact landmarks seem normal gross hearing perhaps decreased left more than right no formal testing done OP : n masked NECK: no obvious masses on inspection palpation  LUNGS: clear to auscultation bilaterally, no wheezes, rales or rhonchi, good air movement CV: HRRR, no clubbing cyanosis or  peripheral edema nl cap refill  MS: moves all extremities without noticeable focal  abnormality Neurologic is nonfocal. PSYCH: pleasant and cooperative, no obvious depression or anxiety Lab Results  Component Value Date   WBC 6.2 03/13/2021   HGB 14.5 03/13/2021   HCT 43.9 03/13/2021   PLT 289 03/13/2021   GLUCOSE 108 (H) 03/13/2021   CHOL 183 08/21/2021   TRIG 93.0 08/21/2021   HDL 44.30 08/21/2021   LDLDIRECT 88.6 11/01/2014   LDLCALC 120 (H) 08/21/2021   ALT 17 03/13/2021   AST 17  03/13/2021   NA 141 03/13/2021   K 4.2 03/13/2021   CL 104 03/13/2021   CREATININE 0.94 03/13/2021   BUN 17 03/13/2021   CO2 25 03/13/2021   TSH 3.65 07/11/2021   INR 0.9 07/30/2019   HGBA1C 5.9 08/21/2021   BP Readings from Last  3 Encounters:  02/07/22 126/82  11/06/21 130/60  04/11/21 130/70    ASSESSMENT AND PLAN:  Discussed the following assessment and plan:  Tinnitus of both ears - Plan: Ambulatory referral to ENT, Tdap vaccine greater than or equal to 7yo IM  Decreased hearing, unspecified laterality ? - left more than right? not formally tested  - Plan: Ambulatory referral to ENT, Tdap vaccine greater than or equal to 7yo IM  Tobacco use  Pre-diabetes Vies full audiologic evaluation ENT check, possibly relating to hearing loss, yes medicines can cause this but would opine to specialist as to possibility.  -Patient advised to return or notify health care team  if  new concerns arise.  Patient Instructions  Will do referral to ENT and they should advise and check your hearing also.  Most common cause would be hearing  loss  over time.  Stop tobacco may help. Medication  illnesses ,  noise damage over the years can promote this.   No alarming findings today .       Neta Mends. Tamya Denardo M.D.

## 2022-02-07 ENCOUNTER — Ambulatory Visit (INDEPENDENT_AMBULATORY_CARE_PROVIDER_SITE_OTHER): Payer: 59 | Admitting: Internal Medicine

## 2022-02-07 ENCOUNTER — Encounter: Payer: Self-pay | Admitting: Internal Medicine

## 2022-02-07 VITALS — BP 126/82 | HR 95 | Temp 98.9°F | Ht 67.0 in | Wt 220.0 lb

## 2022-02-07 DIAGNOSIS — H9313 Tinnitus, bilateral: Secondary | ICD-10-CM | POA: Diagnosis not present

## 2022-02-07 DIAGNOSIS — Z720 Tobacco use: Secondary | ICD-10-CM

## 2022-02-07 DIAGNOSIS — H9190 Unspecified hearing loss, unspecified ear: Secondary | ICD-10-CM | POA: Diagnosis not present

## 2022-02-07 DIAGNOSIS — R7303 Prediabetes: Secondary | ICD-10-CM

## 2022-02-07 DIAGNOSIS — Z23 Encounter for immunization: Secondary | ICD-10-CM

## 2022-02-07 NOTE — Patient Instructions (Signed)
Will do referral to ENT and they should advise and check your hearing also.  Most common cause would be hearing  loss  over time.  Stop tobacco may help. Medication  illnesses ,  noise damage over the years can promote this.   No alarming findings today .

## 2022-02-15 ENCOUNTER — Other Ambulatory Visit: Payer: Self-pay | Admitting: Internal Medicine

## 2022-02-15 MED ORDER — AMPHETAMINE-DEXTROAMPHET ER 20 MG PO CP24: 40.0000 mg | ORAL_CAPSULE | Freq: Every day | ORAL | 0 refills | Status: DC

## 2022-02-15 NOTE — Telephone Encounter (Signed)
Patient called in requesting a medication refill for amphetamine-dextroamphetamine (ADDERALL XR) 20 MG 24 hr capsule [[id number]]  to be sent to her pharmacy.  Patient could be contacted at [phone].  Please advise.

## 2022-03-16 ENCOUNTER — Other Ambulatory Visit: Payer: Self-pay | Admitting: Internal Medicine

## 2022-03-16 ENCOUNTER — Telehealth: Payer: Self-pay | Admitting: Internal Medicine

## 2022-03-16 MED ORDER — AMPHETAMINE-DEXTROAMPHET ER 20 MG PO CP24: 40.0000 mg | ORAL_CAPSULE | Freq: Every day | ORAL | 0 refills | Status: DC

## 2022-03-16 NOTE — Addendum Note (Signed)
Addended by: Gershon Crane A on: 03/16/2022 04:29 PM ? ? Modules accepted: Orders ? ?

## 2022-03-16 NOTE — Telephone Encounter (Signed)
Last refill per controlled substance database: 02/15/22 ?Last OV: for ADHD 11/06/21; for acute reasons 02/07/22 ?Next OV: 04/16/22 ?

## 2022-03-16 NOTE — Telephone Encounter (Signed)
Called patient, aware prescription has been sent. ? ?

## 2022-03-16 NOTE — Telephone Encounter (Signed)
Done

## 2022-03-16 NOTE — Telephone Encounter (Signed)
Patient called in to request a refill for amphetamine-dextroamphetamine (ADDERALL XR) 20 MG 24 hr capsule [[id number]]  to be sent to her pharmacy. ? ?Please advise. ?

## 2022-03-19 LAB — HM COLONOSCOPY

## 2022-03-20 NOTE — Telephone Encounter (Signed)
Last Ov 02/07/22 ?Filled /08/18/21 ?Is it ok to refill? ?

## 2022-03-29 ENCOUNTER — Encounter: Payer: Self-pay | Admitting: Internal Medicine

## 2022-04-09 ENCOUNTER — Other Ambulatory Visit (INDEPENDENT_AMBULATORY_CARE_PROVIDER_SITE_OTHER): Payer: 59

## 2022-04-09 DIAGNOSIS — G4733 Obstructive sleep apnea (adult) (pediatric): Secondary | ICD-10-CM | POA: Diagnosis not present

## 2022-04-09 DIAGNOSIS — Z720 Tobacco use: Secondary | ICD-10-CM

## 2022-04-09 DIAGNOSIS — E785 Hyperlipidemia, unspecified: Secondary | ICD-10-CM

## 2022-04-09 DIAGNOSIS — Z79899 Other long term (current) drug therapy: Secondary | ICD-10-CM

## 2022-04-09 DIAGNOSIS — F902 Attention-deficit hyperactivity disorder, combined type: Secondary | ICD-10-CM | POA: Diagnosis not present

## 2022-04-09 DIAGNOSIS — E039 Hypothyroidism, unspecified: Secondary | ICD-10-CM

## 2022-04-09 LAB — CBC WITH DIFFERENTIAL/PLATELET
Basophils Absolute: 0 10*3/uL (ref 0.0–0.1)
Basophils Relative: 0.6 % (ref 0.0–3.0)
Eosinophils Absolute: 0.3 10*3/uL (ref 0.0–0.7)
Eosinophils Relative: 4.2 % (ref 0.0–5.0)
HCT: 41 % (ref 36.0–46.0)
Hemoglobin: 13.7 g/dL (ref 12.0–15.0)
Lymphocytes Relative: 29 % (ref 12.0–46.0)
Lymphs Abs: 1.8 10*3/uL (ref 0.7–4.0)
MCHC: 33.4 g/dL (ref 30.0–36.0)
MCV: 90.1 fl (ref 78.0–100.0)
Monocytes Absolute: 0.6 10*3/uL (ref 0.1–1.0)
Monocytes Relative: 9.1 % (ref 3.0–12.0)
Neutro Abs: 3.6 10*3/uL (ref 1.4–7.7)
Neutrophils Relative %: 57.1 % (ref 43.0–77.0)
Platelets: 279 10*3/uL (ref 150.0–400.0)
RBC: 4.55 Mil/uL (ref 3.87–5.11)
RDW: 13.4 % (ref 11.5–15.5)
WBC: 6.3 10*3/uL (ref 4.0–10.5)

## 2022-04-09 LAB — LIPID PANEL
Cholesterol: 175 mg/dL (ref 0–200)
HDL: 53 mg/dL (ref >39.00)
LDL Cholesterol: 102 mg/dL — ABNORMAL HIGH (ref 0–99)
NonHDL: 122.28
Total CHOL/HDL Ratio: 3
Triglycerides: 103 mg/dL (ref 0.0–149.0)
VLDL: 20.6 mg/dL (ref 0.0–40.0)

## 2022-04-09 LAB — HEPATIC FUNCTION PANEL
ALT: 22 U/L (ref 0–35)
AST: 15 U/L (ref 0–37)
Albumin: 4.6 g/dL (ref 3.5–5.2)
Alkaline Phosphatase: 84 U/L (ref 39–117)
Bilirubin, Direct: 0.1 mg/dL (ref 0.0–0.3)
Total Bilirubin: 0.4 mg/dL (ref 0.2–1.2)
Total Protein: 6.9 g/dL (ref 6.0–8.3)

## 2022-04-09 LAB — BASIC METABOLIC PANEL
BUN: 13 mg/dL (ref 6–23)
CO2: 26 mEq/L (ref 19–32)
Calcium: 9.4 mg/dL (ref 8.4–10.5)
Chloride: 104 mEq/L (ref 96–112)
Creatinine, Ser: 1.05 mg/dL (ref 0.40–1.20)
GFR: 58.77 mL/min — ABNORMAL LOW (ref >60.00)
Glucose, Bld: 119 mg/dL — ABNORMAL HIGH (ref 70–99)
Potassium: 4.2 mEq/L (ref 3.5–5.1)
Sodium: 138 mEq/L (ref 135–145)

## 2022-04-09 LAB — HEMOGLOBIN A1C: Hgb A1c MFr Bld: 5.8 % (ref 4.6–6.5)

## 2022-04-09 LAB — TSH: TSH: 3.32 u[IU]/mL (ref 0.35–5.50)

## 2022-04-12 NOTE — Progress Notes (Signed)
Stable  results  a1c is ok  will review at upcoming visit

## 2022-04-15 NOTE — Progress Notes (Signed)
? ?Chief Complaint  ?Patient presents with  ? Annual Exam  ? Medication Management  ? Pre-op Exam  ? ? ?HPI: ?Patient  Victoria Carter  57 y.o. comes in today for Preventive Health Care visit  and Chronic disease management ?To have left shoulder replacement per Dr Victoria Carter in May   hx of old fracture and now arthritic changes  ?BP doing ok  ?Stress: job continuing  but adapting  10 hour x 4 days  ?ADHDmedication most helpful to focus "couldn't do her job without help of medication" ?HLD  needs refill atorva  no se reported  ?OSA  using  cpap recnetly  at least 4-5 hours  ?No bleeding resp  concerns . No cp sob  cardic sx  ?Health Maintenance  ?Topic Date Due  ? COVID-19 Vaccine (4 - Booster for [place] series) 08/07/2022 (Originally 11/24/2020)  ? INFLUENZA VACCINE  07/31/2022  ? PAP SMEAR-Modifier  03/12/2023  ? MAMMOGRAM  12/15/2023  ? TETANUS/TDAP  02/08/2032  ? COLONOSCOPY (Pts 45-91yrs Insurance coverage will need to be confirmed)  03/19/2032  ? Hepatitis C Screening  Completed  ? HIV Screening  Completed  ? Zoster Vaccines- Shingrix  Completed  ? HPV VACCINES  Aged Out  ? ?Health Maintenance Review ?LIFESTYLE:  ?Exercise:  not optimum  working on  ?Tobacco/ETS: less than 1/2ppd  quitting for pre suregy  ?Alcohol: no sig ?Sugar beverages:n ?Sleep:  now using cpap preparing for surgery  ?Drug use: no ?HH of 1 ?Work: 4 x 10 ? ?ROS:  ?REST of 12 system review negative except as per HPI had root canacl  to have hearing eval   ? ? ?Past Medical History:  ?Diagnosis Date  ? Allergic rhinitis   ? Depression   ? Hx of abnormal cervical Pap smear   ? used cryo when first married and pregnant  ? Hyperglycemia   ? Hyperlipidemia   ? Sinusitis   ? UTI (lower urinary tract infection)   ? ? ?Past Surgical History:  ?Procedure Laterality Date  ? BREAST BIOPSY  1987  ? BREAST EXCISIONAL BIOPSY Right   ? CERVICAL BIOPSY    ? CESAREAN SECTION    ? ? ?Family History  ?Problem Relation Age of Onset  ? Atrial fibrillation Mother   ?  Heart attack Brother   ?     age 2  ? Diabetes type II Other   ? Hyperlipidemia Other   ? Factor V Leiden deficiency Daughter   ?     Also husband  ? ? ?Social History  ? ?Socioeconomic History  ? Marital status: Unknown  ?  Spouse name: Not on file  ? Number of children: Not on file  ? Years of education: Not on file  ? Highest education level: Some college, no degree  ?Occupational History  ? Not on file  ?Tobacco Use  ? Smoking status: Some Days  ?  Packs/day: 0.50  ?  Years: 10.00  ?  Pack years: 5.00  ?  Types: Cigarettes  ? Smokeless tobacco: Never  ?Vaping Use  ? Vaping Use: Never used  ?Substance and Sexual Activity  ? Alcohol use: Yes  ?  Alcohol/week: 1.0 standard drink  ?  Types: 1 Glasses of wine per week  ? Drug use: No  ? Sexual activity: Not on file  ?Other Topics Concern  ? Not on file  ?Social History Narrative  ? 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  Married now separated  17-Apr-2017  Divorced   ? Regular exercise- yes  yoga  ? HH of 1-  girls home from college  ? Pet dogs passed away 04/17/2016  ? G2P2  ? ?Social Determinants of Health  ? ?Financial Resource Strain: Low Risk   ? Difficulty of Paying Living Expenses: Not very hard  ?Food Insecurity: No Food Insecurity  ? Worried About Charity fundraiser in the Last Year: Never true  ? Ran Out of Food in the Last Year: Never true  ?Transportation Needs: No Transportation Needs  ? Lack of Transportation (Medical): No  ? Lack of Transportation (Non-Medical): No  ?Physical Activity: Insufficiently Active  ? Days of Exercise per Week: 3 days  ? Minutes of Exercise per Session: 30 min  ?Stress: Stress Concern Present  ? Feeling of Stress : Very much  ?Social Connections: Unknown  ? Frequency of Communication with Friends and Family: Once a week  ? Frequency of Social Gatherings with Friends and Family: Patient refused  ? Attends Religious Services: Patient refused  ? Active Member of Clubs or Organizations: No  ? Attends Theatre manager Meetings: Not on file  ? Marital Status: Divorced  ? ? ?Outpatient Medications Prior to Visit  ?Medication Sig Dispense Refill  ? buPROPion (WELLBUTRIN XL) 150 MG 24 hr tablet TAKE ONE TABLET BY MOUTH DAILY 90 tablet 1  ? Calcium 500 MG CHEW 1 chewable tablet daily 30 tablet 6  ? famotidine (PEPCID) 20 MG tablet TAKE ONE TABLET BY MOUTH TWICE A DAY 180 tablet 1  ? Multiple Vitamins-Minerals ([employer] WOMENS DAILY GUMMIES) CHEW Chew 2 Units by mouth daily. 60 tablet 6  ? atorvastatin (LIPITOR) 40 MG tablet Take 1 tablet (40 mg total) by mouth daily. 90 tablet 1  ? FLUoxetine (PROZAC) 20 MG capsule Take 2 capsules (40 mg total) by mouth daily. 180 capsule 1  ? levothyroxine (SYNTHROID) 75 MCG tablet TAKE ONE TABLET BY MOUTH EVERY MORNING BEFORE BREAKFAST 90 tablet 2  ? amphetamine-dextroamphetamine (ADDERALL XR) 20 MG 24 hr capsule Take 2 capsules (40 mg total) by mouth daily. Kenefic 60 capsule 0  ? ?No facility-administered medications prior to visit.  ? ? ? ?EXAM: ? ?BP 120/80 (BP Location: Left Arm, Patient Position: Sitting, Cuff Size: Normal)   Pulse 83   Temp 100.1 ?F (37.8 ?C) (Oral)   Ht 5\' 7"  (1.702 m)   Wt 217 lb 3.2 oz (98.5 kg)   LMP 04/15/2012   SpO2 99%   BMI 34.02 kg/m?  ? ?Body mass index is 34.02 kg/m?. ?Wt Readings from Last 3 Encounters:  ?04/16/22 217 lb 3.2 oz (98.5 kg)  ?02/07/22 220 lb (99.8 kg)  ?11/06/21 223 lb 6.4 oz (101.3 kg)  ? ? ?Physical Exam: ?Vital signs reviewed ?[name] is a well-developed well-nourished alert cooperative    who appearsr stated age in no acute distress.  ?HEENT: normocephalic atraumatic , Eyes: PERRL EOM's full, conjunctiva clear, Nares: paten,t no deformity discharge or tenderness., Ears: no deformity EAC's clear TMs with normal landmarks.NECK: supple without masses, thyromegaly or bruits. ?CHEST/PULM:  Clear to auscultation and percussion breath sounds equal no wheeze , rales or rhonchi. No chest wall deformities or tenderness. ?Breast:  normal by inspection . No dimpling, discharge, masses, tenderness or discharge . ?CV: PMI is nondisplaced, S1 S2 no gallops, murmurs, rubs. Peripheral pulses are full without delay.No JVD .  ?ABDOMEN: Bowel sounds normal nontender  No guard or rebound, no hepato splenomegal no  CVA tenderness.  No hernia. ?Extremtities:  No clubbing cyanosis or edema, no acute joint swelling or redness no focal atrophy ?NEURO:  Oriented x3, cranial nerves 3-12 appear to be intact, no obvious focal weakness,gait within normal limits no abnormal reflexes or asymmetrical inc motor activity nl interaction  baseline ?SKIN: No acute rashes normal turgor, color, no bruising or petechiae. ?PSYCH: Oriented, good eye contact, no obvious depression anxiety, cognition and judgment appear normal. ?LN: no cervical axillary inguinal adenopathy ? ?Lab Results  ?Component Value Date  ? WBC 6.3 04/09/2022  ? HGB 13.7 04/09/2022  ? HCT 41.0 04/09/2022  ? PLT 279.0 04/09/2022  ? GLUCOSE 119 (H) 04/09/2022  ? CHOL 175 04/09/2022  ? TRIG 103.0 04/09/2022  ? HDL 53.00 04/09/2022  ? LDLDIRECT 88.6 11/01/2014  ? LDLCALC 102 (H) 04/09/2022  ? ALT 22 04/09/2022  ? AST 15 04/09/2022  ? NA 138 04/09/2022  ? K 4.2 04/09/2022  ? CL 104 04/09/2022  ? CREATININE 1.05 04/09/2022  ? BUN 13 04/09/2022  ? CO2 26 04/09/2022  ? TSH 3.32 04/09/2022  ? INR 0.9 07/30/2019  ? HGBA1C 5.8 04/09/2022  ? ? ?BP Readings from Last 3 Encounters:  ?04/16/22 120/80  ?02/07/22 126/82  ?11/06/21 130/60  ? ? ?Lab results in record reviewed with patient  ? ?ASSESSMENT AND PLAN: ? ?Discussed the following assessment and plan: ? ?  ICD-10-CM   ?1. Visit for preventive health examination  Z00.00   ?  ?2. Attention deficit hyperactivity disorder (ADHD), combined type  F90.2   ? cont med benefit more than risk  ?  ?3. Hyperlipidemia, unspecified hyperlipidemia type  E78.5   ? ldl 102 on atorva 40  ?  ?4. OSA (obstructive sleep apnea)  G47.33   ?  ?5. Pre-diabetes  R73.03   ?  ?6. Medication  management  Z79.899   ?  ?7. Hypothyroidism, unspecified type  E03.9   ?  ?8. BMI 34.0-34.9,adult  Z68.34   ?  ?9. Pre-op evaluation  Z01.818   ?  ?10. Loose body in left shoulder  M24.012   ?  ?11. Tobacco use  Z72.0

## 2022-04-16 ENCOUNTER — Telehealth: Payer: Self-pay

## 2022-04-16 ENCOUNTER — Ambulatory Visit (INDEPENDENT_AMBULATORY_CARE_PROVIDER_SITE_OTHER): Payer: 59 | Admitting: Internal Medicine

## 2022-04-16 ENCOUNTER — Encounter: Payer: Self-pay | Admitting: Internal Medicine

## 2022-04-16 VITALS — BP 120/80 | HR 83 | Temp 100.1°F | Ht 67.0 in | Wt 217.2 lb

## 2022-04-16 DIAGNOSIS — Z6834 Body mass index (BMI) 34.0-34.9, adult: Secondary | ICD-10-CM

## 2022-04-16 DIAGNOSIS — E039 Hypothyroidism, unspecified: Secondary | ICD-10-CM

## 2022-04-16 DIAGNOSIS — R7303 Prediabetes: Secondary | ICD-10-CM | POA: Diagnosis not present

## 2022-04-16 DIAGNOSIS — Z0000 Encounter for general adult medical examination without abnormal findings: Secondary | ICD-10-CM

## 2022-04-16 DIAGNOSIS — E785 Hyperlipidemia, unspecified: Secondary | ICD-10-CM | POA: Diagnosis not present

## 2022-04-16 DIAGNOSIS — Z720 Tobacco use: Secondary | ICD-10-CM

## 2022-04-16 DIAGNOSIS — G4733 Obstructive sleep apnea (adult) (pediatric): Secondary | ICD-10-CM | POA: Diagnosis not present

## 2022-04-16 DIAGNOSIS — M24012 Loose body in left shoulder: Secondary | ICD-10-CM

## 2022-04-16 DIAGNOSIS — F902 Attention-deficit hyperactivity disorder, combined type: Secondary | ICD-10-CM

## 2022-04-16 DIAGNOSIS — Z01818 Encounter for other preprocedural examination: Secondary | ICD-10-CM

## 2022-04-16 DIAGNOSIS — Z79899 Other long term (current) drug therapy: Secondary | ICD-10-CM

## 2022-04-16 MED ORDER — AMPHETAMINE-DEXTROAMPHET ER 20 MG PO CP24: 40.0000 mg | ORAL_CAPSULE | Freq: Every day | ORAL | 0 refills | Status: DC

## 2022-04-16 MED ORDER — FLUOXETINE HCL 20 MG PO CAPS: 40.0000 mg | ORAL_CAPSULE | Freq: Every day | ORAL | 1 refills | Status: DC

## 2022-04-16 MED ORDER — LEVOTHYROXINE SODIUM 75 MCG PO TABS: 75.0000 ug | ORAL_TABLET | Freq: Every day | ORAL | 3 refills | Status: DC

## 2022-04-16 MED ORDER — ATORVASTATIN CALCIUM 40 MG PO TABS: 40.0000 mg | ORAL_TABLET | Freq: Every day | ORAL | 3 refills | Status: DC

## 2022-04-16 NOTE — Patient Instructions (Addendum)
Good to see  you today  ?Agree with  stopping tobacco   ?Will send info to dr Ave Filter  for surgery in May .  ? ?Continue  the adderall  as it seem to help. ?Good luck with the surgery . ? ?Plan rov in 6 months med check  ?

## 2022-04-16 NOTE — Telephone Encounter (Signed)
Last Ov 04/16/22 ?Pre-op form received and placed in folder  ?

## 2022-04-18 ENCOUNTER — Telehealth: Payer: Self-pay | Admitting: Internal Medicine

## 2022-04-18 NOTE — Telephone Encounter (Signed)
Darel Hong from [facility] called because she received clearance from our office but only received the chem 7 lab results. She needs CBC, A1C, EKG, and PTINR results. Patient is cleared for everything else. ? ? ? ?Please fax to [phone] ? ?Good callback number for Darel Hong is [phone] ?

## 2022-04-18 NOTE — Telephone Encounter (Signed)
Should have been already done   please make sure

## 2022-04-19 NOTE — Telephone Encounter (Signed)
Last Ov 04/16/22 ?Please advise  ?

## 2022-04-20 NOTE — Telephone Encounter (Signed)
Send copy of all labs done  on 4 10 23   ?I didn't do an EKG not needed. ?INR not done as labs were not done as a preop.  ?

## 2022-04-20 NOTE — Telephone Encounter (Signed)
A copy of Labs on 04/09/22 was faxed  ?

## 2022-05-01 HISTORY — PX: SHOULDER SURGERY: SHX246

## 2022-05-29 ENCOUNTER — Telehealth: Payer: Self-pay | Admitting: Internal Medicine

## 2022-05-29 NOTE — Telephone Encounter (Signed)
Pt is calling and needs a refill on amphetamine-dextroamphetamine (ADDERALL XR) 20 MG 24 hr capsule  [facility] [id number] - [place], [place] - [address] Phone:  [phone]  Fax:  [phone]

## 2022-05-30 MED ORDER — AMPHETAMINE-DEXTROAMPHET ER 20 MG PO CP24: 40.0000 mg | ORAL_CAPSULE | Freq: Every day | ORAL | 0 refills | Status: DC

## 2022-05-30 NOTE — Telephone Encounter (Signed)
Last Ov 04/16/22  Filled 04/16/22 Is it ok to refill?

## 2022-05-30 NOTE — Addendum Note (Signed)
Addended byBerniece Andreas K on: 05/30/2022 07:20 PM   Modules accepted: Orders

## 2022-05-31 NOTE — Telephone Encounter (Signed)
Noted  

## 2022-07-11 ENCOUNTER — Telehealth: Payer: Self-pay | Admitting: Internal Medicine

## 2022-07-11 MED ORDER — AMPHETAMINE-DEXTROAMPHET ER 20 MG PO CP24: 40.0000 mg | ORAL_CAPSULE | Freq: Every day | ORAL | 0 refills | Status: DC

## 2022-07-11 NOTE — Telephone Encounter (Signed)
Last Ov 04/16/22  Filled 05/30/22 Is it ok to refill?

## 2022-07-11 NOTE — Telephone Encounter (Signed)
Sent in electronically .  

## 2022-07-11 NOTE — Telephone Encounter (Signed)
Pt requesting refill of amphetamine-dextroamphetamine (ADDERALL XR) 20 MG 24 hr capsule (Expired)  [facility] [id number] - , [place] - [address] Phone:  [phone]  Fax:  [phone]

## 2022-07-11 NOTE — Addendum Note (Signed)
Addended byBerniece Andreas K on: 07/11/2022 04:57 PM   Modules accepted: Orders

## 2022-07-13 NOTE — Telephone Encounter (Signed)
Noted  

## 2022-08-06 ENCOUNTER — Institutional Professional Consult (permissible substitution): Payer: 59 | Admitting: Adult Health

## 2022-08-10 ENCOUNTER — Telehealth: Payer: Self-pay | Admitting: Internal Medicine

## 2022-08-10 NOTE — Telephone Encounter (Signed)
Pt call and stated she need a refill on amphetamine-dextroamphetamine (ADDERALL XR) 20 MG 24 hr capsule sent to  [facility] [id number] - [place], [place] - [address] Phone:  [phone]  Fax:  [phone]

## 2022-08-15 MED ORDER — AMPHETAMINE-DEXTROAMPHET ER 20 MG PO CP24: 40.0000 mg | ORAL_CAPSULE | Freq: Every day | ORAL | 0 refills | Status: DC

## 2022-08-15 NOTE — Telephone Encounter (Signed)
Sent in electronically .  

## 2022-08-17 NOTE — Telephone Encounter (Signed)
error 

## 2022-08-20 ENCOUNTER — Ambulatory Visit (INDEPENDENT_AMBULATORY_CARE_PROVIDER_SITE_OTHER): Payer: 59 | Admitting: Adult Health

## 2022-08-20 ENCOUNTER — Encounter: Payer: Self-pay | Admitting: Adult Health

## 2022-08-20 VITALS — BP 120/80 | HR 90 | Temp 98.2°F | Ht 67.0 in | Wt 215.0 lb

## 2022-08-20 DIAGNOSIS — F902 Attention-deficit hyperactivity disorder, combined type: Secondary | ICD-10-CM

## 2022-08-20 DIAGNOSIS — G4733 Obstructive sleep apnea (adult) (pediatric): Secondary | ICD-10-CM

## 2022-08-20 NOTE — Progress Notes (Signed)
@Patient  ID: Victoria Carter, female    DOB: [birth year], 57 y.o.   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  Patient was last seen in the office December 2019.  Patient has underlying moderate obstructive sleep apnea.  She has been on CPAP.  Patient says she has been under a lot of stress over the last few years.  Has been wearing her CPAP on and off.  Continues to have ongoing daytime sleepiness restless sleep.  Says she falls asleep on the couch quite a bit and then wakes up and goes to bed.  She says she does try to wear it but still has difficulty.  Patient typically goes to bed about 11 PM to midnight.  Does not take that long to go to sleep gets up 2 or 3 times at night.  And is up at 7 AM.  Previous sleep study in 2016 showed moderate sleep apnea with AHI at 18/hour.  Current weight at that time was at 202 pounds.  Patient denies any symptoms of cataplexy or sleep paralysis.  Does not take any sleep aids.  She does have attention deficit disorder and is followed by her primary care provider.  She is on Adderall XR 20 mg daily.  She does have depression and is on Wellbutrin and Prozac. Epworth score is 5 out of 24.  She typically gets sleepy when she is watching TV or sitting down in the evening. Weight has gone up over the last couple years.  Current weight is at 215 pounds with a BMI of 33. Caffeine intake is minimum. CPAP download shows minimum use with average at 3 hours.  Patient is on CPAP 13 cm H2O.  AHI 3.1/hour.     Allergies  Allergen Reactions   Tizanidine Itching and Swelling    Immunization  History  Administered Date(s) Administered   Influenza Split 10/11/2011, 09/11/2012   Influenza Whole 10/30/2007, 10/01/2008, 09/21/2010   Influenza,inj,Quad PF,6+ Mos 09/16/2013, 08/27/2014, 10/18/2016, 09/11/2017, 09/14/2019, 09/21/2021   Influenza-Unspecified 09/19/2015, 10/08/2018, 10/16/2019   PFIZER(Purple Top)SARS-COV-2 Vaccination 03/24/2020, 04/18/2020, 09/29/2020   Td 05/31/2002   Tdap 06/11/2012, 02/07/2022   Zoster Recombinat (Shingrix) 07/11/2017, 09/11/2017    Past Medical History:  Diagnosis Date   Allergic rhinitis    Depression    Hx of abnormal cervical Pap smear    used cryo when first married and pregnant   Hyperglycemia    Hyperlipidemia    Sinusitis    UTI (lower urinary tract infection)    Vaginitis and vulvovaginitis 11/04/2013   poss early yeast  . rx disc topical otc first and add diflucan if needed Drug IA discussed     Tobacco History: Social History   Tobacco Use  Smoking Status Former   Packs/day: 0.50   Years: 10.00   Total pack years: 5.00   Types: Cigarettes   Quit date: 04/27/2022   Years since quitting: 0.3  Smokeless Tobacco Never   Counseling Skates: Not Answered   Outpatient Medications Prior to Visit  Medication Sig Dispense Refill   amphetamine-dextroamphetamine (ADDERALL XR) 20 MG 24 hr capsule Take 2 capsules (  40 mg total) by mouth daily. [employer] 60 capsule 0   atorvastatin (LIPITOR) 40 MG tablet Take 1 tablet (40 mg total) by mouth daily. 90 tablet 3   buPROPion (WELLBUTRIN XL) 150 MG 24 hr tablet TAKE ONE TABLET BY MOUTH DAILY 90 tablet 1   Calcium 500 MG CHEW 1 chewable tablet daily 30 tablet 6   famotidine (PEPCID) 20 MG tablet TAKE ONE TABLET BY MOUTH TWICE A DAY 180 tablet 1   FLUoxetine (PROZAC) 20 MG capsule Take 2 capsules (40 mg total) by mouth daily. 180 capsule 1   levothyroxine (SYNTHROID) 75 MCG tablet Take 1 tablet (75 mcg total) by mouth daily with breakfast. 90 tablet 3   Multiple Vitamins-Minerals ([employer]  WOMENS DAILY GUMMIES) CHEW Chew 2 Units by mouth daily. 60 tablet 6   No facility-administered medications prior to visit.     Review of Systems:   Constitutional:   No  weight loss, night sweats,  Fevers, chills,  +fatigue, or  lassitude.  HEENT:   No headaches,  Difficulty swallowing,  Tooth/dental problems, or  Sore throat,                No sneezing, itching, ear ache, nasal congestion, post nasal drip,   CV:  No chest pain,  Orthopnea, PND, swelling in lower extremities, anasarca, dizziness, palpitations, syncope.   GI  No heartburn, indigestion, abdominal pain, nausea, vomiting, diarrhea, change in bowel habits, loss of appetite, bloody stools.   Resp: No shortness of breath with exertion or at rest.  No excess mucus, no productive cough,  No non-productive cough,  No coughing up of blood.  No change in color of mucus.  No wheezing.  No chest wall deformity  Skin: no rash or lesions.  GU: no dysuria, change in color of urine, no urgency or frequency.  No flank pain, no hematuria   MS:  No joint pain or swelling.  No decreased range of motion.  No back pain.    Physical Exam  BP 120/80 (BP Location: Right Arm, Patient Position: Sitting)   Pulse 90   Temp 98.2 F (36.8 C) (Oral)   Ht 5\' 7"  (1.702 m)   Wt 215 lb (97.5 kg)   LMP 04/15/2012   SpO2 99%   BMI 33.67 kg/m   GEN: A/Ox3; pleasant , NAD, well nourished    HEENT:  [place]/AT,   NOSE-clear, THROAT-clear, no lesions, no postnasal drip or exudate noted.  Class II-III MP airway  NECK:  Supple w/ fair ROM; no JVD; normal carotid impulses w/o bruits; no thyromegaly or nodules palpated; no lymphadenopathy.    RESP  Clear  P & A; w/o, wheezes/ rales/ or rhonchi. no accessory muscle use, no dullness to percussion  CARD:  RRR, no m/r/g, no peripheral edema, pulses intact, no cyanosis or clubbing.  GI:   Soft & nt; nml bowel sounds; no organomegaly or masses detected.   Musco: Warm bil, no deformities or joint swelling  noted.   Neuro: alert, no focal deficits noted.    Skin: Warm, no lesions or rashes    Lab Results:  CBC    BNP No results found for: "BNP"  ProBNP No results found for: "PROBNP"  Imaging: No results found.        No data to display          No results found for: "NITRICOXIDE"      Assessment & Plan:   OSA (obstructive sleep apnea) Moderate obstructive sleep  apnea with ongoing symptom burden with restless sleep, snoring, daytime sleepiness.  Patient is encouraged on CPAP compliance.  Patient will restart CPAP.  She needs order for new supplies.  This has been sent to her homecare company.  Long discussion regarding healthy sleep regimen and importance of using her CPAP.  - discussed how weight can impact sleep and risk for sleep disordered breathing - discussed options to assist with weight loss: combination of diet modification, cardiovascular and strength training exercises   - had an extensive discussion regarding the adverse health consequences related to untreated sleep disordered breathing - specifically discussed the risks for hypertension, coronary artery disease, cardiac dysrhythmias, cerebrovascular disease, and diabetes - lifestyle modification discussed   - discussed how sleep disruption can increase risk of accidents, particularly when driving - safe driving practices were discussed    Plan  Patient Instructions  Restart CPAP at bedtime Goal is to get  6 hours each night. Work on healthy weight Do not drive if sleepy May use Saline nasal spray Twice daily  As needed   May try Saline nasal gel At bedtime  As needed   Follow up with Dr. Vassie Loll or Kristianne Albin NP in 6 weeks and As needed       Attention deficit hyperactivity disorder (ADHD), combined type Attention deficit disorder continue follow-up with primary care provider.  Continue on current regimen.  Have encouraged her on healthy sleep regimen and CPAP uses to help with her daytime  symptoms as well  Obesity Healthy weight loss discussed in detail     Rubye Oaks, NP 08/20/2022

## 2022-08-20 NOTE — Patient Instructions (Addendum)
Restart CPAP at bedtime Goal is to get  6 hours each night. Work on healthy weight Do not drive if sleepy May use Saline nasal spray Twice daily  As needed   May try Saline nasal gel At bedtime  As needed   Follow up with Dr. Vassie Loll or Eureka Valdes NP in 6 weeks and As needed

## 2022-08-20 NOTE — Assessment & Plan Note (Signed)
Healthy weight loss discussed in detail 

## 2022-08-20 NOTE — Assessment & Plan Note (Signed)
Moderate obstructive sleep apnea with ongoing symptom burden with restless sleep, snoring, daytime sleepiness.  Patient is encouraged on CPAP compliance.  Patient will restart CPAP.  She needs order for new supplies.  This has been sent to her homecare company.  Long discussion regarding healthy sleep regimen and importance of using her CPAP.  - discussed how weight can impact sleep and risk for sleep disordered breathing - discussed options to assist with weight loss: combination of diet modification, cardiovascular and strength training exercises   - had an extensive discussion regarding the adverse health consequences related to untreated sleep disordered breathing - specifically discussed the risks for hypertension, coronary artery disease, cardiac dysrhythmias, cerebrovascular disease, and diabetes - lifestyle modification discussed   - discussed how sleep disruption can increase risk of accidents, particularly when driving - safe driving practices were discussed    Plan  Patient Instructions  Restart CPAP at bedtime Goal is to get  6 hours each night. Work on healthy weight Do not drive if sleepy May use Saline nasal spray Twice daily  As needed   May try Saline nasal gel At bedtime  As needed   Follow up with Dr. Vassie Loll or Faiza Bansal NP in 6 weeks and As needed

## 2022-08-20 NOTE — Assessment & Plan Note (Signed)
Attention deficit disorder continue follow-up with primary care provider.  Continue on current regimen.  Have encouraged her on healthy sleep regimen and CPAP uses to help with her daytime symptoms as well

## 2022-09-15 ENCOUNTER — Other Ambulatory Visit: Payer: Self-pay | Admitting: Internal Medicine

## 2022-09-18 ENCOUNTER — Telehealth: Payer: Self-pay | Admitting: Internal Medicine

## 2022-09-18 NOTE — Telephone Encounter (Signed)
Ok to refill x 1 year 

## 2022-09-18 NOTE — Telephone Encounter (Signed)
Pt is calling and would like a refill on amphetamine-dextroamphetamine (ADDERALL XR) 20 MG 24 hr capsule  [facility] [id number] - [place], [place] - [address] Phone:  [phone]  Fax:  [phone]

## 2022-09-20 MED ORDER — AMPHETAMINE-DEXTROAMPHET ER 20 MG PO CP24: 40.0000 mg | ORAL_CAPSULE | Freq: Every day | ORAL | 0 refills | Status: DC

## 2022-09-20 NOTE — Addendum Note (Signed)
Addended byShanon Ace K on: 09/20/2022 10:59 AM   Modules accepted: Orders

## 2022-09-21 NOTE — Telephone Encounter (Signed)
Contact patient. Left a detail message that the Rx is sent. If has question, to call us back.

## 2022-09-27 ENCOUNTER — Ambulatory Visit (INDEPENDENT_AMBULATORY_CARE_PROVIDER_SITE_OTHER): Payer: 59 | Admitting: Adult Health

## 2022-09-27 ENCOUNTER — Encounter: Payer: Self-pay | Admitting: Adult Health

## 2022-09-27 VITALS — BP 112/72 | HR 79 | Temp 98.2°F | Ht 66.0 in | Wt 214.4 lb

## 2022-09-27 DIAGNOSIS — Z23 Encounter for immunization: Secondary | ICD-10-CM | POA: Diagnosis not present

## 2022-09-27 DIAGNOSIS — G4733 Obstructive sleep apnea (adult) (pediatric): Secondary | ICD-10-CM

## 2022-09-27 NOTE — Patient Instructions (Addendum)
Continue on CPAP at bedtime Goal is to get  6 hours each night. Work on healthy weight Do not drive if sleepy May use Saline nasal spray Twice daily  As needed   May try Saline nasal gel At bedtime  As needed   Flu shot today .  Follow up with Dr. Elsworth Soho or Ervin Hensley NP in 1 year and As needed

## 2022-09-27 NOTE — Assessment & Plan Note (Signed)
Improved compliance on CPAP . /excellent control -encouraged to use each night for at least 6hr   Plan Patient Instructions  Continue on CPAP at bedtime Goal is to get  6 hours each night. Work on healthy weight Do not drive if sleepy May use Saline nasal spray Twice daily  As needed   May try Saline nasal gel At bedtime  As needed   Flu shot today .  Follow up with Dr. Elsworth Soho or Raechell Singleton NP in 1 year and As needed    '

## 2022-09-27 NOTE — Progress Notes (Signed)
@Patient  ID: Victoria Carter, female    DOB: 1965/10/01, 57 y.o.   MRN: [id number]  Chief Complaint  Patient presents with   Follow-up    Referring provider: Burnis Medin, MD  HPI: 57 year old female followed for moderate obstructive sleep apnea Business analyst for [place] history significant for attention deficit disorder followed by primary care  TEST/EVENTS :  Sleep study 10/2015 >AHI 18/hr - wt 202  09/27/2022 Follow up : OSA  Patient presents for 6-week follow-up for sleep apnea.  Patient was diagnosed with sleep apnea in 2016.  Says that she has been wearing her CPAP on and off.  She has ongoing daytime sleepiness and restless sleep.  Last visit patient was recommend to restart CPAP.  Since last visit patient is feeling some better she is trying to wear her CPAP more often.  Compliance report shows increased usage at 70%.  Typically about 4 and half hours daily.  She continues on CPAP 13 cm H2O.  AHI 2.2/hour.  We discussed trying to use her CPAP every day and for at least 6 or more hours.  Patient education was [place]. Biggest issue she falls asleep on couch and then gets up later and goes to bed.       Immunization History  Administered Date(s) Administered   Influenza Split 10/11/2011, 09/11/2012   Influenza Whole 10/30/2007, 10/01/2008, 09/21/2010   Influenza,inj,Quad PF,6+ Mos 09/16/2013, 08/27/2014, 10/18/2016, 09/11/2017, 09/14/2019, 09/21/2021   Influenza-Unspecified 09/19/2015, 10/08/2018, 10/16/2019   PFIZER(Purple Top)SARS-COV-2 Vaccination 03/24/2020, 04/18/2020, 09/29/2020   Td 05/31/2002   Tdap 06/11/2012, 02/07/2022   Zoster Recombinat (Shingrix) 07/11/2017, 09/11/2017    Past Medical History:  Diagnosis Date   Allergic rhinitis    Depression    Hx of abnormal cervical Pap smear    used cryo when first married and pregnant   Hyperglycemia    Hyperlipidemia    Sinusitis    UTI (lower urinary tract infection)    Vaginitis and vulvovaginitis  11/04/2013   poss early yeast  . rx disc topical otc first and add diflucan if needed Drug IA discussed     Tobacco History: Social History   Tobacco Use  Smoking Status Former   Packs/day: 0.50   Years: 10.00   Total pack years: 5.00   Types: Cigarettes   Quit date: 04/27/2022   Years since quitting: 0.4  Smokeless Tobacco Never   Counseling [place]: Not Answered   Outpatient Medications Prior to Visit  Medication Sig Dispense Refill   amphetamine-dextroamphetamine (ADDERALL XR) 20 MG 24 hr capsule Take 2 capsules (40 mg total) by mouth daily. [place] 60 capsule 0   atorvastatin (LIPITOR) 40 MG tablet Take 1 tablet (40 mg total) by mouth daily. 90 tablet 3   buPROPion (WELLBUTRIN XL) 150 MG 24 hr tablet TAKE ONE TABLET BY MOUTH DAILY 30 tablet 0   Calcium 500 MG CHEW 1 chewable tablet daily 30 tablet 6   famotidine (PEPCID) 20 MG tablet TAKE ONE TABLET BY MOUTH TWICE A DAY 180 tablet 1   FLUoxetine (PROZAC) 20 MG capsule TAKE 2 CAPSULES BY MOUTH DAILY 180 capsule 0   levothyroxine (SYNTHROID) 75 MCG tablet Take 1 tablet (75 mcg total) by mouth daily with breakfast. 90 tablet 3   Multiple Vitamins-Minerals ([facility] WOMENS DAILY GUMMIES) CHEW Chew 2 Units by mouth daily. 60 tablet 6   No facility-administered medications prior to visit.     Review of Systems:   Constitutional:   No  weight loss, night  sweats,  Fevers, chills, fatigue, or  lassitude.  HEENT:   No headaches,  Difficulty swallowing,  Tooth/dental problems, or  Sore throat,                No sneezing, itching, ear ache, nasal congestion, post nasal drip,   CV:  No chest pain,  Orthopnea, PND, swelling in lower extremities, anasarca, dizziness, palpitations, syncope.   GI  No heartburn, indigestion, abdominal pain, nausea, vomiting, diarrhea, change in bowel habits, loss of appetite, bloody stools.   Resp: No shortness of breath with exertion or at rest.  No excess mucus, no productive cough,  No non-productive  cough,  No coughing up of blood.  No change in color of mucus.  No wheezing.  No chest wall deformity  Skin: no rash or lesions.  GU: no dysuria, change in color of urine, no urgency or frequency.  No flank pain, no hematuria   MS:  No joint pain or swelling.  No decreased range of motion.  No back pain.    Physical Exam  BP 112/72 (BP Location: Left Arm, Patient Position: Sitting, Cuff Size: Normal)   Pulse 79   Temp 98.2 F (36.8 C) (Oral)   Ht 5\' 6"  (1.676 m)   Wt 214 lb 6.4 oz (97.3 kg)   LMP 04/15/2012   SpO2 99%   BMI 34.61 kg/m   GEN: A/Ox3; pleasant , NAD, well nourished    HEENT:  [place]/AT,  NOSE-clear, THROAT-clear, no lesions, no postnasal drip or exudate noted.   NECK:  Supple w/ fair ROM; no JVD; normal carotid impulses w/o bruits; no thyromegaly or nodules palpated; no lymphadenopathy.    RESP  Clear  P & A; w/o, wheezes/ rales/ or rhonchi. no accessory muscle use, no dullness to percussion  CARD:  RRR, no m/r/g, no peripheral edema, pulses intact, no cyanosis or clubbing.  GI:   Soft & nt; nml bowel sounds; no organomegaly or masses detected.   Musco: Warm bil, no deformities or joint swelling noted.   Neuro: alert, no focal deficits noted.    Skin: Warm, no lesions or rashes    Lab Results:  CBC  BNP No results found for: "BNP"  ProBNP No results found for: "PROBNP"  Imaging: No results found.        No data to display          No results found for: "NITRICOXIDE"      Assessment & Plan:   OSA (obstructive sleep apnea) Improved compliance on CPAP . /excellent control -encouraged to use each night for at least 6hr   Plan Patient Instructions  Continue on CPAP at bedtime Goal is to get  6 hours each night. Work on healthy weight Do not drive if sleepy May use Saline nasal spray Twice daily  As needed   May try Saline nasal gel At bedtime  As needed   Flu shot today .  Follow up with Dr. June or Javed Cotto NP in 1 year and As  needed    '    Maximiliano Cromartie, NP 09/27/2022

## 2022-10-12 ENCOUNTER — Telehealth: Payer: Self-pay | Admitting: Adult Health

## 2022-10-12 NOTE — Telephone Encounter (Signed)
Fax received from Dr. Dorna Leitz with [facility] to perform a LEFT TOTAL HIP ARTHROPLASTY on patient.  Patient needs surgery clearance. Surgery is PENDING. Patient was seen on 09/27/2022. Office protocol is a risk assessment can be sent to surgeon if patient has been seen in 60 days or less.   Sending to Rexene Edison NP for risk assessment or recommendations if patient needs to be seen in office prior to surgical procedure.

## 2022-10-16 ENCOUNTER — Encounter: Payer: Self-pay | Admitting: Internal Medicine

## 2022-10-16 ENCOUNTER — Ambulatory Visit (INDEPENDENT_AMBULATORY_CARE_PROVIDER_SITE_OTHER): Payer: 59 | Admitting: Internal Medicine

## 2022-10-16 ENCOUNTER — Other Ambulatory Visit: Payer: Self-pay | Admitting: Internal Medicine

## 2022-10-16 VITALS — BP 124/78 | HR 82 | Temp 98.5°F | Wt 214.0 lb

## 2022-10-16 DIAGNOSIS — Z79899 Other long term (current) drug therapy: Secondary | ICD-10-CM | POA: Diagnosis not present

## 2022-10-16 DIAGNOSIS — Z01818 Encounter for other preprocedural examination: Secondary | ICD-10-CM | POA: Diagnosis not present

## 2022-10-16 DIAGNOSIS — F902 Attention-deficit hyperactivity disorder, combined type: Secondary | ICD-10-CM

## 2022-10-16 DIAGNOSIS — R739 Hyperglycemia, unspecified: Secondary | ICD-10-CM | POA: Diagnosis not present

## 2022-10-16 DIAGNOSIS — G4733 Obstructive sleep apnea (adult) (pediatric): Secondary | ICD-10-CM

## 2022-10-16 DIAGNOSIS — M1612 Unilateral primary osteoarthritis, left hip: Secondary | ICD-10-CM

## 2022-10-16 DIAGNOSIS — E782 Mixed hyperlipidemia: Secondary | ICD-10-CM

## 2022-10-16 LAB — POCT GLYCOSYLATED HEMOGLOBIN (HGB A1C): Hemoglobin A1C: 5.6 % (ref 4.0–5.6)

## 2022-10-16 MED ORDER — BUPROPION HCL ER (XL) 150 MG PO TB24: 150.0000 mg | ORAL_TABLET | Freq: Every day | ORAL | 1 refills | Status: DC

## 2022-10-16 MED ORDER — AMPHETAMINE-DEXTROAMPHET ER 20 MG PO CP24: 40.0000 mg | ORAL_CAPSULE | Freq: Every day | ORAL | 0 refills | Status: DC

## 2022-10-16 NOTE — Telephone Encounter (Signed)
Patient has moderate obstructive sleep apnea-last seen in the office September 27, 2022 Had increased compliance on CPAP -excellent control with residual AHI at 2.2/hour.  Class II-III MP airway  From a preop pulmonary clearance standpoint patient would be considered a mild surgical risk.  Recommend CPAP at bedtime and with naps.  Also recommend CPAP as needed in the postop setting.

## 2022-10-16 NOTE — Telephone Encounter (Signed)
OV notes and clearance form have been faxed back to [facility]. Nothing further needed at this time.  

## 2022-10-16 NOTE — Patient Instructions (Signed)
Good to see you today . Will send   info to Dr Berenice Primas  office . Will refill Adderall and  Wellbutrin .to local pharmacy.Marland Kitchen

## 2022-10-16 NOTE — Progress Notes (Signed)
Chief Complaint  Patient presents with   Follow-up   Pre-op Exam    HPI: Patient  Victoria Carter  57 y.o. comes in today for preopevaluation and med evalution for THA left  Dr Luiz Blare .  Last visit was 4 23  for PV  She has lost some weight stop smoking for surgery .  Feels physically well no unusual bleeding chest pain shortness of breath neurologic symptoms. Is taking fluoxetine and Wellbutrin 150 XR augmentation seems to be doing okay he never came back for follow-up but okay maintaining Would like refill of the Adderall as it is very helpful to concentrate for her work situation which is calm down a bit Sleep apnea under control.  Health Maintenance  Topic Date Due   COVID-19 Vaccine (4 - Pfizer risk series) 11/24/2020   PAP SMEAR-Modifier  03/12/2023   MAMMOGRAM  12/15/2023   TETANUS/TDAP  02/08/2032   COLONOSCOPY (Pts 45-34yrs Insurance coverage will need to be confirmed)  03/19/2032   INFLUENZA VACCINE  Completed   Hepatitis C Screening  Completed   HIV Screening  Completed   Zoster Vaccines- Shingrix  Completed   HPV VACCINES  Aged Out      ROS:  GEN/ HEENT: No fever, significant weight changes sweats headaches vision problems hearing changes, CV/ PULM; No chest pain shortness of breath cough, syncope,edema  change in exercise tolerance. GI /GU: No adominal pain, vomiting, change in bowel habits. No blood in the stool. No significant GU symptoms. SKIN/HEME: ,no acute skin rashes suspicious lesions or bleeding. No lymphadenopathy, nodules, masses.  NEURO/ PSYCH:  No neurologic signs such as weakness numbness. No depression anxiety. IMM/ Allergy: No unusual infections.  Allergy .   REST of 12 system review negative except as per HPI   Past Medical History:  Diagnosis Date   Allergic rhinitis    Depression    Hx of abnormal cervical Pap smear    used cryo when first married and pregnant   Hyperglycemia    Hyperlipidemia    Sinusitis    UTI (lower urinary tract  infection)    Vaginitis and vulvovaginitis 11/04/2013   poss early yeast  . rx disc topical otc first and add diflucan if needed Drug IA discussed     Past Surgical History:  Procedure Laterality Date   BREAST BIOPSY  1987   BREAST EXCISIONAL BIOPSY Right    CERVICAL BIOPSY     CESAREAN SECTION     SHOULDER SURGERY Left 05/01/2022    Family History  Problem Relation Age of Onset   Atrial fibrillation Mother    Heart attack Brother        age 42   Diabetes type II Other    Hyperlipidemia Other    Factor V Leiden deficiency Daughter        Also husband    Social History   Socioeconomic History   Marital status: Unknown    Spouse name: Not on file   Number of children: Not on file   Years of education: Not on file   Highest education level: Some college, no degree  Occupational History   Not on file  Tobacco Use   Smoking status: Former    Packs/day: 0.50    Years: 10.00    Total pack years: 5.00    Types: Cigarettes    Quit date: 04/27/2022    Years since quitting: 0.4   Smokeless tobacco: Never  Vaping Use   Vaping Use: Never used  Substance and Sexual Activity   Alcohol use: Yes    Alcohol/week: 1.0 standard drink of alcohol    Types: 1 Glasses of wine per week   Drug use: No   Sexual activity: Not on file  Other Topics Concern   Not on file  Social History Narrative   Occupation: 40 per week 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  [employer]  In stress position    Married now separated  12-May-2017  Divorced    Regular exercise- yes  yoga   HH of 1-  girls home from college   Pet dogs passed away 05-12-2016   G2P2   Social Determinants of Health   Financial Resource Strain: Low Risk  (02/05/2022)   Overall Financial Resource Strain (CARDIA)    Difficulty of Paying Living Expenses: Not very hard  Food Insecurity: No Food Insecurity (02/05/2022)   Hunger Vital Sign    Worried About Running Out of Food in the Last Year: Never true    Ran Out of Food in the Last Year: Never true  Transportation  Needs: No Transportation Needs (02/05/2022)   PRAPARE - Administrator, Civil Service (Medical): No    Lack of Transportation (Non-Medical): No  Physical Activity: Insufficiently Active (02/05/2022)   Exercise Vital Sign    Days of Exercise per Week: 3 days    Minutes of Exercise per Session: 30 min  Stress: Stress Concern Present (02/05/2022)   [employer] of Occupational Health - Occupational Stress Questionnaire    Feeling of Stress : Very much  Social Connections: Unknown (02/05/2022)   Social Connection and Isolation Panel [NHANES]    Frequency of Communication with Friends and Family: Once a week    Frequency of Social Gatherings with Friends and Family: Patient refused    Attends Religious Services: Patient refused    Database administrator or Organizations: No    Attends Engineer, structural: Not on file    Marital Status: Divorced    Outpatient Medications Prior to Visit  Medication Sig Dispense Refill   amoxicillin (AMOXIL) 500 MG capsule Take 1,000 mg by mouth 2 (two) times daily.     atorvastatin (LIPITOR) 40 MG tablet Take 1 tablet (40 mg total) by mouth daily. 90 tablet 3   Azelastine HCl 137 MCG/SPRAY SOLN Place into both nostrils.     Calcium 500 MG CHEW 1 chewable tablet daily 30 tablet 6   famotidine (PEPCID) 20 MG tablet TAKE ONE TABLET BY MOUTH TWICE A DAY 180 tablet 1   FLUoxetine (PROZAC) 20 MG capsule TAKE 2 CAPSULES BY MOUTH DAILY 180 capsule 0   levothyroxine (SYNTHROID) 75 MCG tablet Take 1 tablet (75 mcg total) by mouth daily with breakfast. 90 tablet 3   Multiple Vitamins-Minerals ([facility] WOMENS DAILY GUMMIES) CHEW Chew 2 Units by mouth daily. 60 tablet 6   amphetamine-dextroamphetamine (ADDERALL XR) 20 MG 24 hr capsule Take 2 capsules (40 mg total) by mouth daily. [employer] 60 capsule 0   buPROPion (WELLBUTRIN XL) 150 MG 24 hr tablet TAKE ONE TABLET BY MOUTH DAILY 30 tablet 0   No facility-administered medications prior to visit.      EXAM:  BP 124/78 (BP Location: Right Arm, Patient Position: Sitting, Cuff Size: Large)   Pulse 82   Temp 98.5 F (36.9 C) (Oral)   Wt 214 lb (97.1 kg)   LMP 04/15/2012   SpO2 96%   BMI 34.54 kg/m   Body mass index is 34.54 kg/m. Wt  Readings from Last 3 Encounters:  10/16/22 214 lb (97.1 kg)  09/27/22 214 lb 6.4 oz (97.3 kg)  08/20/22 215 lb (97.5 kg)    Physical Exam: Vital signs reviewed PJK:DTOI is a well-developed well-nourished alert cooperative    who appearsr stated age in no acute distress.  HEENT: normocephalic atraumatic , Eyes: PERRL EOM's full, conjunctiva clear, Nares: paten,t no deformity discharge or tenderness., Ears: no deformity EAC's clear TMs with normal landmarks.NECK: supple without masses, thyromegaly or bruits. CHEST/PULM:  Clear to auscultation and percussion breath sounds equal no wheeze , rales or rhonchi. No chest wall deformities or tenderness. CV: PMI is nondisplaced, S1 S2 no gallops, murmurs, rubs. Peripheral pulses are full without delay.No JVD .  ABDOMEN: Bowel sounds normal nontender  No guard or rebound, no hepato splenomegal no CVA tenderness.   Extremtities:  No clubbing cyanosis or edema, no acute joint swelling or redness no focal atrophy NEURO:  Oriented x3, cranial nerves 3-12 appear to be intact, no obvious focal weakness,SKIN: No acute rashes normal turgor, color, no bruising or petechiae. PSYCH: Oriented, good eye contact, no obvious depression anxiety, cognition and judgment appear normal. LN: no cervical adenopathy  Lab Results  Component Value Date   WBC 6.3 04/09/2022   HGB 13.7 04/09/2022   HCT 41.0 04/09/2022   PLT 279.0 04/09/2022   GLUCOSE 119 (H) 04/09/2022   CHOL 175 04/09/2022   TRIG 103.0 04/09/2022   HDL 53.00 04/09/2022   LDLDIRECT 88.6 11/01/2014   LDLCALC 102 (H) 04/09/2022   ALT 22 04/09/2022   AST 15 04/09/2022   NA 138 04/09/2022   K 4.2 04/09/2022   CL 104 04/09/2022   CREATININE 1.05 04/09/2022    BUN 13 04/09/2022   CO2 26 04/09/2022   TSH 3.32 04/09/2022   INR 0.9 07/30/2019   HGBA1C 5.6 10/16/2022    BP Readings from Last 3 Encounters:  10/16/22 124/78  09/27/22 112/72  08/20/22 120/80      ASSESSMENT AND PLAN:  Discussed the following assessment and plan:    ICD-10-CM   1. Arthritis of left hip  M16.12     2. Pre-op evaluation  Z01.818     3. Medication management  Z79.899     4. Attention deficit hyperactivity disorder (ADHD), combined type  F90.2     5. OSA (obstructive sleep apnea)  G47.33     6. HYPERLIPIDEMIA  E78.2     7. Hyperglycemia  R73.9 POC HgB A1c    A1c today is in a very good range Blood pressure under control continue tobacco free.  Continue healthy lifestyle We will refill Adderall and Wellbutrin today continue Medical optimization for surgery. We will send information and fax to her surgeon Dr. Berenice Primas. Plan due for CPX labs previsit call ahead in 6 months Return in about 6 months (around 04/17/2023) for cpx with labs  .  Patient Care Team: Nolene Rocks, Standley Brooking, MD as PCP - Patsi Sears, MD (Dermatology) Juanita Craver, MD as Consulting Physician (Gastroenterology) Arbutus Leas, PhD as Consulting Physician (Psychology) Dorna Leitz, MD as Consulting Physician (Orthopedic Surgery) Patient Instructions  Good to see you today . Will send   info to Dr Berenice Primas  office . Will refill Adderall and  Wellbutrin .to local pharmacy.Standley Brooking. Mccartney Chuba M.D.

## 2022-10-31 ENCOUNTER — Telehealth: Payer: Self-pay | Admitting: Internal Medicine

## 2022-10-31 NOTE — Telephone Encounter (Signed)
Requesting more detailed notes from her last office visit. Fax to [phone]

## 2022-11-08 NOTE — Telephone Encounter (Signed)
Office notes faxed to [phone]. Received a confirmation.

## 2022-11-14 ENCOUNTER — Emergency Department (HOSPITAL_COMMUNITY)
Admission: EM | Admit: 2022-11-14 | Discharge: 2022-11-14 | Discharge status: Against Medical Advice | Payer: 59 | Attending: Emergency Medicine | Admitting: Emergency Medicine

## 2022-11-14 DIAGNOSIS — G2581 Restless legs syndrome: Secondary | ICD-10-CM | POA: Diagnosis not present

## 2022-11-14 DIAGNOSIS — R569 Unspecified convulsions: Secondary | ICD-10-CM | POA: Diagnosis not present

## 2022-11-14 DIAGNOSIS — Z5321 Procedure and treatment not carried out due to patient leaving prior to being seen by health care provider: Secondary | ICD-10-CM | POA: Insufficient documentation

## 2022-11-14 NOTE — ED Notes (Signed)
Pt called for triage, no response. 

## 2022-11-14 NOTE — ED Triage Notes (Signed)
Pt [name] explanon while at surgical center. Now pt with restless leg syndrome. Sent for obs for cardiac arrhythmia and seizure.

## 2022-11-15 ENCOUNTER — Telehealth: Payer: Self-pay

## 2022-11-15 NOTE — Telephone Encounter (Signed)
Transition Care Management Follow-up Telephone Call Date of discharge and from where: 11/14/22 from [facility] How have you been since you were released from the hospital? Had hip replacement at surgical center & pt felt like she was having reaction from Lidocaine; pt was having total body restless leg.  Was sent by EMS from surgical center. Pt states she feeling fine right now. Dr Luiz Blare called her this morning & gave her symptoms to look out for.  Any questions or concerns? No  Follow up appointments reviewed:  PCP Hospital f/u appt confirmed? No  Pt does not need appt with Dr Fabian Sharp. Is being followed by orthopedic doctor. Specialist Hospital f/u appt confirmed? Yes  Scheduled to see Dr Luiz Blare on 11/27/22  If their condition worsens, is the pt aware to call PCP or go to the Emergency Dept.? Yes Was the patient provided with contact information for the PCP's office or ED? Yes Was to pt encouraged to call back with questions or concerns? Yes

## 2022-11-20 ENCOUNTER — Other Ambulatory Visit: Payer: Self-pay | Admitting: Internal Medicine

## 2022-11-20 DIAGNOSIS — Z1231 Encounter for screening mammogram for malignant neoplasm of breast: Secondary | ICD-10-CM

## 2022-11-22 ENCOUNTER — Other Ambulatory Visit: Payer: Self-pay | Admitting: Internal Medicine

## 2022-11-27 NOTE — Telephone Encounter (Signed)
Ok to send in 1 year supply of fluoxetine

## 2022-11-29 ENCOUNTER — Telehealth: Payer: Self-pay | Admitting: Internal Medicine

## 2022-11-29 NOTE — Telephone Encounter (Signed)
Pt called to request a refill of the  amphetamine-dextroamphetamine (ADDERALL XR) 20 MG 24 hr capsule (Expired)   [facility] [id number] - [place], [place] - [address] Phone: [phone]  Fax: [phone]     LOV:  10/16/22

## 2022-11-30 MED ORDER — AMPHETAMINE-DEXTROAMPHET ER 20 MG PO CP24: 40.0000 mg | ORAL_CAPSULE | Freq: Every day | ORAL | 0 refills | Status: DC

## 2022-11-30 NOTE — Telephone Encounter (Signed)
Done

## 2022-11-30 NOTE — Telephone Encounter (Signed)
Last OV 10/16/22, notes below: She has lost some weight stop smoking for surgery .  Feels physically well no unusual bleeding chest pain shortness of breath neurologic symptoms. Is taking fluoxetine and Wellbutrin 150 XR augmentation seems to be doing okay he never came back for follow-up but okay maintaining Would like refill of the Adderall as it is very helpful to concentrate for her work situation which is calm down a bit We will refill Adderall and Wellbutrin today continue  Return in about 6 months (around 04/17/2023) for cpx with labs   Next OV: not scheduled Last refill noted in [facility]: 10/16/22

## 2022-11-30 NOTE — Telephone Encounter (Signed)
Pt notified that refill for Adderall was sent to her pharmacy per Dr Clent Ridges

## 2022-11-30 NOTE — Addendum Note (Signed)
Addended by: Gershon Crane A on: 11/30/2022 09:37 AM   Modules accepted: Orders

## 2023-01-10 ENCOUNTER — Ambulatory Visit
Admission: RE | Admit: 2023-01-10 | Discharge: 2023-01-10 | Disposition: A | Discharge status: Home / Self Care | Payer: 59 | Source: Ambulatory Visit | Attending: Internal Medicine | Admitting: Internal Medicine

## 2023-01-10 DIAGNOSIS — Z1231 Encounter for screening mammogram for malignant neoplasm of breast: Secondary | ICD-10-CM

## 2023-01-18 ENCOUNTER — Ambulatory Visit: Payer: 59

## 2023-02-03 ENCOUNTER — Other Ambulatory Visit: Payer: Self-pay | Admitting: Internal Medicine

## 2023-02-13 ENCOUNTER — Encounter: Payer: Self-pay | Admitting: Internal Medicine

## 2023-02-19 MED ORDER — AMPHETAMINE-DEXTROAMPHET ER 20 MG PO CP24: 40.0000 mg | ORAL_CAPSULE | Freq: Every day | ORAL | 0 refills | Status: DC

## 2023-02-19 NOTE — Telephone Encounter (Signed)
Dont know, please contact pharamcy and see if her rx is a generic  we may have to send in in a different manner

## 2023-03-04 ENCOUNTER — Other Ambulatory Visit: Payer: Self-pay | Admitting: Internal Medicine

## 2023-03-26 ENCOUNTER — Telehealth: Payer: Self-pay | Admitting: Internal Medicine

## 2023-03-26 NOTE — Telephone Encounter (Signed)
Prescription Request  03/26/2023  LOV: 10/16/2022  What is the name of the medication or equipment? amphetamine-dextroamphetamine (ADDERALL XR) 20 MG 24 hr capsule amphetamine-dextroamphetamine (ADDERALL XR) 20 MG 24 hr capsule   Have you contacted your pharmacy to request a refill? No   Which pharmacy would you like this sent to?  [facility] [id number] - [place], [place] [address] Phone: [phone] Fax: [phone]   Patient notified that their request is being sent to the clinical staff for review and that they should receive a response within 2 business days.   Please advise at Mobile [phone] (mobile)

## 2023-03-27 MED ORDER — AMPHETAMINE-DEXTROAMPHET ER 20 MG PO CP24: 40.0000 mg | ORAL_CAPSULE | Freq: Every day | ORAL | 0 refills | Status: DC

## 2023-03-27 NOTE — Telephone Encounter (Signed)
Sent in electronically .  

## 2023-04-26 ENCOUNTER — Other Ambulatory Visit: Payer: Self-pay | Admitting: Family

## 2023-04-26 ENCOUNTER — Telehealth: Payer: Self-pay | Admitting: Internal Medicine

## 2023-04-26 MED ORDER — AMPHETAMINE-DEXTROAMPHET ER 20 MG PO CP24: 40.0000 mg | ORAL_CAPSULE | Freq: Every day | ORAL | 0 refills | Status: DC

## 2023-04-26 NOTE — Telephone Encounter (Signed)
Prescription Request  04/26/2023  LOV: 10/16/2022  What is the name of the medication or equipment? amphetamine-dextroamphetamine (ADDERALL XR) 20 MG 24 hr capsule   Have you contacted your pharmacy to request a refill? No   Which pharmacy would you like this sent to?  [facility] [id number] - [place], [place] - [address] [place] [address] Phone: [phone] Fax: [phone]    Patient notified that their request is being sent to the clinical staff for review and that they should receive a response within 2 business days.   Please advise at Mobile [phone] (mobile)

## 2023-04-29 NOTE — Telephone Encounter (Signed)
Rx sent on 04/26/2023 by P. Hyman Hopes, NP.   Follow up with pt. Pt reports she received a notification from her pharmacy that it is ready to be pick up. No further action is needed.

## 2023-05-28 ENCOUNTER — Telehealth: Payer: Self-pay | Admitting: Internal Medicine

## 2023-05-28 ENCOUNTER — Other Ambulatory Visit: Payer: Self-pay | Admitting: Internal Medicine

## 2023-05-28 DIAGNOSIS — E039 Hypothyroidism, unspecified: Secondary | ICD-10-CM

## 2023-05-28 DIAGNOSIS — Z79899 Other long term (current) drug therapy: Secondary | ICD-10-CM

## 2023-05-28 DIAGNOSIS — R739 Hyperglycemia, unspecified: Secondary | ICD-10-CM

## 2023-05-28 DIAGNOSIS — Z0000 Encounter for general adult medical examination without abnormal findings: Secondary | ICD-10-CM

## 2023-05-28 DIAGNOSIS — R7303 Prediabetes: Secondary | ICD-10-CM

## 2023-05-28 DIAGNOSIS — E782 Mixed hyperlipidemia: Secondary | ICD-10-CM

## 2023-05-28 DIAGNOSIS — E785 Hyperlipidemia, unspecified: Secondary | ICD-10-CM

## 2023-05-28 MED ORDER — AMPHETAMINE-DEXTROAMPHET ER 20 MG PO CP24: 40.0000 mg | ORAL_CAPSULE | Freq: Every day | ORAL | 0 refills | Status: DC

## 2023-05-28 NOTE — Telephone Encounter (Signed)
Pt was scheduled for a CPE on 06/26/23.  Pt would like to come in on 06/19/23, to do her lab work.  Pt is asking that MD please put in the order so that Pt could have her labs, before she comes in for her CPE.   Please advise.

## 2023-05-28 NOTE — Progress Notes (Signed)
Future orders for cpe and med management

## 2023-05-28 NOTE — Telephone Encounter (Signed)
I placed cpe future orders   . So she can get labs pre cpe visit

## 2023-05-28 NOTE — Telephone Encounter (Signed)
Prescription Request  05/28/2023  LOV: 10/16/2022  What is the name of the medication or equipment? amphetamine-dextroamphetamine (ADDERALL XR) 20 MG 24 hr capsule   Have you contacted your pharmacy to request a refill? No   Which pharmacy would you like this sent to?  [facility] [id number] - [place], [place] - [address] [place] [address] Phone: [phone] Fax: [phone]     Patient notified that their request is being sent to the clinical staff for review and that they should receive a response within 2 business days.   Please advise at Mobile [phone] (mobile)

## 2023-05-30 NOTE — Telephone Encounter (Signed)
Notify pt. Pt is aware. Future lab appt scheduled.

## 2023-06-04 ENCOUNTER — Telehealth: Payer: Self-pay | Admitting: Internal Medicine

## 2023-06-04 NOTE — Telephone Encounter (Signed)
Contact patient  need more information    is she asking for 10 mg xr and take 2 ?  Has she contacted other pharmacies that may have what she is on?

## 2023-06-04 NOTE — Telephone Encounter (Signed)
Pt call and stated [facility] don't have the amphetamine-dextroamphetamine (ADDERALL XR) 20 MG 24 hr capsule and want to know will dr.panosh change it to the 10 mg she stated it is on back order and don't know when it will come in.Pt want a call back.

## 2023-06-05 MED ORDER — AMPHETAMINE-DEXTROAMPHET ER 10 MG PO CP24: 40.0000 mg | ORAL_CAPSULE | Freq: Every day | ORAL | 0 refills | Status: DC

## 2023-06-05 NOTE — Telephone Encounter (Signed)
Spoke to pt.   Pt is asking for 10 mg and to take 4. Pt states she doesn't have [facility] near her and her insurance doesn't covered with [facility]. But haven't contacted other pharmacies to see if they would have it.   Pt states she will try to call other [facility] during her lunch today and let us know.

## 2023-06-19 ENCOUNTER — Other Ambulatory Visit (INDEPENDENT_AMBULATORY_CARE_PROVIDER_SITE_OTHER): Payer: 59

## 2023-06-19 DIAGNOSIS — E785 Hyperlipidemia, unspecified: Secondary | ICD-10-CM | POA: Diagnosis not present

## 2023-06-19 DIAGNOSIS — R739 Hyperglycemia, unspecified: Secondary | ICD-10-CM

## 2023-06-19 DIAGNOSIS — Z79899 Other long term (current) drug therapy: Secondary | ICD-10-CM

## 2023-06-19 DIAGNOSIS — E782 Mixed hyperlipidemia: Secondary | ICD-10-CM

## 2023-06-19 DIAGNOSIS — Z0000 Encounter for general adult medical examination without abnormal findings: Secondary | ICD-10-CM

## 2023-06-19 DIAGNOSIS — E039 Hypothyroidism, unspecified: Secondary | ICD-10-CM

## 2023-06-19 DIAGNOSIS — R7303 Prediabetes: Secondary | ICD-10-CM

## 2023-06-19 LAB — CBC WITH DIFFERENTIAL/PLATELET
Basophils Absolute: 0 10*3/uL (ref 0.0–0.1)
Basophils Relative: 0.6 % (ref 0.0–3.0)
Eosinophils Absolute: 0.2 10*3/uL (ref 0.0–0.7)
Eosinophils Relative: 4.4 % (ref 0.0–5.0)
HCT: 42.6 % (ref 36.0–46.0)
Hemoglobin: 14.1 g/dL (ref 12.0–15.0)
Lymphocytes Relative: 30.8 % (ref 12.0–46.0)
Lymphs Abs: 1.5 10*3/uL (ref 0.7–4.0)
MCHC: 33 g/dL (ref 30.0–36.0)
MCV: 89 fl (ref 78.0–100.0)
Monocytes Absolute: 0.5 10*3/uL (ref 0.1–1.0)
Monocytes Relative: 9.7 % (ref 3.0–12.0)
Neutro Abs: 2.6 10*3/uL (ref 1.4–7.7)
Neutrophils Relative %: 54.5 % (ref 43.0–77.0)
Platelets: 275 10*3/uL (ref 150.0–400.0)
RBC: 4.79 Mil/uL (ref 3.87–5.11)
RDW: 14 % (ref 11.5–15.5)
WBC: 4.9 10*3/uL (ref 4.0–10.5)

## 2023-06-19 LAB — HEPATIC FUNCTION PANEL
ALT: 28 U/L (ref 0–35)
AST: 25 U/L (ref 0–37)
Albumin: 4.4 g/dL (ref 3.5–5.2)
Alkaline Phosphatase: 91 U/L (ref 39–117)
Bilirubin, Direct: 0.1 mg/dL (ref 0.0–0.3)
Total Bilirubin: 0.4 mg/dL (ref 0.2–1.2)
Total Protein: 7.2 g/dL (ref 6.0–8.3)

## 2023-06-19 LAB — BASIC METABOLIC PANEL
BUN: 14 mg/dL (ref 6–23)
CO2: 27 mEq/L (ref 19–32)
Calcium: 9.3 mg/dL (ref 8.4–10.5)
Chloride: 105 mEq/L (ref 96–112)
Creatinine, Ser: 1.03 mg/dL (ref 0.40–1.20)
GFR: 59.64 mL/min — ABNORMAL LOW (ref >60.00)
Glucose, Bld: 131 mg/dL — ABNORMAL HIGH (ref 70–99)
Potassium: 4.3 mEq/L (ref 3.5–5.1)
Sodium: 140 mEq/L (ref 135–145)

## 2023-06-19 LAB — LIPID PANEL
Cholesterol: 190 mg/dL (ref 0–200)
HDL: 47.5 mg/dL (ref >39.00)
LDL Cholesterol: 125 mg/dL — ABNORMAL HIGH (ref 0–99)
NonHDL: 142.62
Total CHOL/HDL Ratio: 4
Triglycerides: 89 mg/dL (ref 0.0–149.0)
VLDL: 17.8 mg/dL (ref 0.0–40.0)

## 2023-06-19 LAB — TSH: TSH: 2.99 u[IU]/mL (ref 0.35–5.50)

## 2023-06-19 LAB — HEMOGLOBIN A1C: Hgb A1c MFr Bld: 5.7 % (ref 4.6–6.5)

## 2023-06-24 NOTE — Progress Notes (Signed)
Ldl cholesterol up some as well  as glucose but no diabetes. Thyroid  blood count , liver  all in range . Will review/discuss at upcoming  visit

## 2023-06-25 NOTE — Progress Notes (Signed)
Chief Complaint  Patient presents with   Annual Exam    HPI: Patient  Victoria Carter  58 y.o. comes in today for Preventive Health Care visit  and gyne pap Med check  HLD says taking atorva 40 but not eating as healthy recently  BP ok  ADHD: " if didn't have meds wouldn't have a job"   supply issues at times  generic  had paid out of pocked for brand  in past  OSA on cpap Mood  feels stable ok  Thyroid  no change med  HCM Utd mammo colon  every 5 years hx of polyps  due for pap remote hx of abn but has been normal otherwise   Health Maintenance  Topic Date Due   PAP SMEAR-Modifier  03/12/2023   COVID-19 Vaccine (4 - 2023-24 season) 07/12/2023 (Originally 08/31/2022)   INFLUENZA VACCINE  08/01/2023   MAMMOGRAM  01/10/2025   DTaP/Tdap/Td (4 - Td or Tdap) 02/08/2032   Colonoscopy  03/19/2032   Hepatitis C Screening  Completed   HIV Screening  Completed   Zoster Vaccines- Shingrix  Completed   HPV VACCINES  Aged Out   Health Maintenance Review LIFESTYLE:  Exercise:   hx of bilateral hip replacements  once a week  gardening  Tobacco/ETS: ocass Alcohol: social  Sugar beverages: not daily  Sleep: less recently  osa  bad with using  Drug use: no HH of  1  dog  Work: 40 +     ROS:  REST of 12 system review negative except as per HPI   Past Medical History:  Diagnosis Date   Allergic rhinitis    Depression    Hx of abnormal cervical Pap smear    used cryo when first married and pregnant   Hyperglycemia    Hyperlipidemia    Sinusitis    UTI (lower urinary tract infection)    Vaginitis and vulvovaginitis 11/04/2013   poss early yeast  . rx disc topical otc first and add diflucan if needed Drug IA discussed     Past Surgical History:  Procedure Laterality Date   BREAST BIOPSY  1987   BREAST EXCISIONAL BIOPSY Right    CERVICAL BIOPSY     CESAREAN SECTION     SHOULDER SURGERY Left 05/01/2022    Family History  Problem Relation Age of Onset   Atrial fibrillation  Mother    Heart attack Brother        age 41   Diabetes type II Other    Hyperlipidemia Other    Factor V Leiden deficiency Daughter        Also husband    Social History   Socioeconomic History   Marital status: Divorced    Spouse name: Not on file   Number of children: Not on file   Years of education: Not on file   Highest education level: Some college, no degree  Occupational History   Not on file  Tobacco Use   Smoking status: Former    Packs/day: 0.50    Years: 10.00    Additional pack years: 0.00    Total pack years: 5.00    Types: Cigarettes    Quit date: 04/27/2022    Years since quitting: 1.1   Smokeless tobacco: Never   Tobacco comments:    Pt reports still smokes every now and then. During break from work. 06/26/2023. km   Vaping Use   Vaping Use: Never used  Substance and Sexual Activity  Alcohol use: Yes    Alcohol/week: 1.0 standard drink of alcohol    Types: 1 Glasses of wine per week   Drug use: No   Sexual activity: Not on file  Other Topics Concern   Not on file  Social History Narrative   Occupation: 40 per week 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  [employer]  In stress position    Married now separated  01/19/2017  Divorced    Regular exercise- yes  yoga   HH of 1-  girls home from college   Pet dogs passed away 01-20-2016   G2P2   Social Determinants of Health   Financial Resource Strain: Low Risk  (02/05/2022)   Overall Financial Resource Strain (CARDIA)    Difficulty of Paying Living Expenses: Not very hard  Food Insecurity: No Food Insecurity (02/05/2022)   Hunger Vital Sign    Worried About Running Out of Food in the Last Year: Never true    Ran Out of Food in the Last Year: Never true  Transportation Needs: No Transportation Needs (02/05/2022)   PRAPARE - Administrator, Civil Service (Medical): No    Lack of Transportation (Non-Medical): No  Physical Activity: Insufficiently Active (06/26/2023)   Exercise Vital Sign    Days of Exercise per Week: 2 days     Minutes of Exercise per Session: 10 min  Stress: Stress Concern Present (02/05/2022)   [employer] of Occupational Health - Occupational Stress Questionnaire    Feeling of Stress : Very much  Social Connections: Unknown (02/05/2022)   Social Connection and Isolation Panel [NHANES]    Frequency of Communication with Friends and Family: Once a week    Frequency of Social Gatherings with Friends and Family: Patient declined    Attends Religious Services: Patient declined    Database administrator or Organizations: No    Attends Engineer, structural: Not on file    Marital Status: Divorced    Outpatient Medications Prior to Visit  Medication Sig Dispense Refill   amphetamine-dextroamphetamine (ADDERALL XR) 10 MG 24 hr capsule Take 4 capsules (40 mg total) by mouth daily. Shortage of 20 mg xr 120 capsule 0   atorvastatin (LIPITOR) 40 MG tablet TAKE 1 TABLET BY MOUTH DAILY 90 tablet 3   Calcium 500 MG CHEW 1 chewable tablet daily 30 tablet 6   FLUoxetine (PROZAC) 20 MG capsule TAKE 2 CAPSULES BY MOUTH DAILY 180 capsule 3   levothyroxine (SYNTHROID) 75 MCG tablet TAKE 1 TABLET BY MOUTH DAILY  WITH BREAKFAST 90 tablet 3   Multiple Vitamins-Minerals ([employer] WOMENS DAILY GUMMIES) CHEW Chew 2 Units by mouth daily. 60 tablet 6   amoxicillin (AMOXIL) 500 MG capsule Take 1,000 mg by mouth 2 (two) times daily. (Patient not taking: Reported on 06/26/2023)     amphetamine-dextroamphetamine (ADDERALL XR) 20 MG 24 hr capsule Take 2 capsules (40 mg total) by mouth daily. [employer] can do generic  as per insurance (Patient not taking: Reported on 06/26/2023) 60 capsule 0   Azelastine HCl 137 MCG/SPRAY SOLN Place into both nostrils. (Patient not taking: Reported on 06/26/2023)     buPROPion (WELLBUTRIN XL) 150 MG 24 hr tablet Take 1 tablet (150 mg total) by mouth daily. (Patient not taking: Reported on 06/26/2023) 90 tablet 1   No facility-administered medications prior to visit.     EXAM:  BP  114/80 (BP Location: Left Arm, Patient Position: Sitting, Cuff Size: Large)   Pulse 85   Temp 99.3 F (  37.4 C) (Oral)   Ht 5\' 6"  (1.676 m)   Wt 218 lb 12.8 oz (99.2 kg)   LMP 04/15/2012   SpO2 95%   BMI 35.32 kg/m   Body mass index is 35.32 kg/m. Wt Readings from Last 3 Encounters:  06/26/23 218 lb 12.8 oz (99.2 kg)  10/16/22 214 lb (97.1 kg)  09/27/22 214 lb 6.4 oz (97.3 kg)    Physical Exam: Vital signs reviewed NGE:XBMW is a well-developed well-nourished alert cooperative    who appearsr stated age in no acute distress.  Inc motor activity looks well  HEENT: normocephalic atraumatic , Eyes: PERRL EOM's full, conjunctiva clear, Nares: paten,t no deformity discharge or tenderness., Ears: no deformity EAC's clear TMs with normal landmarks. Mouth: clear OP, no lesions, edema.  Moist mucous membranes. Dentition in adequate repair. NECK: supple without masses, thyromegaly or bruits. CHEST/PULM:  Clear to auscultation and percussion breath sounds equal no wheeze , rales or rhonchi. Breast: normal by inspection . No dimpling, discharge, masses, tenderness or discharge . CV: PMI is nondisplaced, S1 S2 no gallops, murmurs, rubs. Peripheral pulses are full without delay.No JVD .  ABDOMEN: Bowel sounds normal nontender  No guard or rebound, no hepato splenomegal no CVA tenderness.  . Extremtities:  No clubbing cyanosis or edema, no acute joint swelling or redness no focal atrophy NEURO:  Oriented x3, cranial nerves 3-12 appear to be intact, no obvious focal weakness,gait within normal limits no abnormal reflexes or asymmetrical SKIN: No acute rashes normal turgor, color, no bruising or petechiae. PSYCH: Oriented, good eye contact, no obvious depression anxiety, cognition and judgment appear normal. LN: no cervical axillary adenopathy Pelvic: NL ext GU, labia clear without lesions or rash . skin tag soft at introitus  of Vagina no lesions .Cervix: clear  UTERUS: Neg CMT Adnexa:  clear no masses  . PAP done with HPV cotesting    Lab Results  Component Value Date   WBC 4.9 06/19/2023   HGB 14.1 06/19/2023   HCT 42.6 06/19/2023   PLT 275.0 06/19/2023   GLUCOSE 131 (H) 06/19/2023   CHOL 190 06/19/2023   TRIG 89.0 06/19/2023   HDL 47.50 06/19/2023   LDLDIRECT 88.6 11/01/2014   LDLCALC 125 (H) 06/19/2023   ALT 28 06/19/2023   AST 25 06/19/2023   NA 140 06/19/2023   K 4.3 06/19/2023   CL 105 06/19/2023   CREATININE 1.03 06/19/2023   BUN 14 06/19/2023   CO2 27 06/19/2023   TSH 2.99 06/19/2023   INR 0.9 07/30/2019   HGBA1C 5.7 06/19/2023    BP Readings from Last 3 Encounters:  06/26/23 114/80  10/16/22 124/78  09/27/22 112/72    Lab results reviewed with patient   ASSESSMENT AND PLAN:  Discussed the following assessment and plan:    ICD-10-CM   1. Visit for preventive health examination  Z00.00     2. Encounter for gynecological examination without abnormal finding  Z01.419     3. Attention deficit hyperactivity disorder (ADHD), combined type  F90.2    continue medication helpful contact if need to go to differnt pharamcy cause of supply shortage    4. Fasting hyperglycemia  R73.01     5. Mixed hyperlipidemia  E78.2     6. Medication management  Z79.899     7. Sleep disorder breathing  G47.30     8. Screening for cervical cancer  Z12.4 Cytology - PAP    9. Pre-diabetes  R73.03     10. Hypothyroidism, unspecified  type  E03.9     Reviewed   need to get ldl lower , bg control , perhaps modest weight reduction  counsel and fu in 4-6 mos and labs pre visit  Counseled about methods and fu  Return for 4-6 mos   lab pre visit .  Patient Care Team: Damen Windsor, Neta Mends, MD as PCP - Juanna Cao, MD (Dermatology) Charna Elizabeth, MD as Consulting Physician (Gastroenterology) Vilinda Flake, PhD as Consulting Physician (Psychology) Jodi Geralds, MD as Consulting Physician (Orthopedic Surgery) Patient Instructions  Good to see you today . Blood sugar up  but not diabetic . Choelsterol not as good. Intensify lifestyle interventions. As we discussed  Plan fu in 4-6 months and want to get repeat   lipid hg A1c  and bmp before visit   Burna Mortimer K. Taela Charbonneau M.D.

## 2023-06-26 ENCOUNTER — Other Ambulatory Visit: Payer: Self-pay | Admitting: Internal Medicine

## 2023-06-26 ENCOUNTER — Other Ambulatory Visit (HOSPITAL_COMMUNITY)
Admission: RE | Admit: 2023-06-26 | Discharge: 2023-06-26 | Disposition: A | Discharge status: Home / Self Care | Payer: 59 | Source: Ambulatory Visit | Attending: Internal Medicine | Admitting: Internal Medicine

## 2023-06-26 ENCOUNTER — Encounter: Payer: Self-pay | Admitting: Internal Medicine

## 2023-06-26 ENCOUNTER — Ambulatory Visit (INDEPENDENT_AMBULATORY_CARE_PROVIDER_SITE_OTHER): Payer: 59 | Admitting: Internal Medicine

## 2023-06-26 VITALS — BP 114/80 | HR 85 | Temp 99.3°F | Ht 66.0 in | Wt 218.8 lb

## 2023-06-26 DIAGNOSIS — Z79899 Other long term (current) drug therapy: Secondary | ICD-10-CM

## 2023-06-26 DIAGNOSIS — E782 Mixed hyperlipidemia: Secondary | ICD-10-CM

## 2023-06-26 DIAGNOSIS — Z124 Encounter for screening for malignant neoplasm of cervix: Secondary | ICD-10-CM | POA: Insufficient documentation

## 2023-06-26 DIAGNOSIS — R7301 Impaired fasting glucose: Secondary | ICD-10-CM

## 2023-06-26 DIAGNOSIS — Z0000 Encounter for general adult medical examination without abnormal findings: Secondary | ICD-10-CM

## 2023-06-26 DIAGNOSIS — F902 Attention-deficit hyperactivity disorder, combined type: Secondary | ICD-10-CM | POA: Diagnosis not present

## 2023-06-26 DIAGNOSIS — Z01419 Encounter for gynecological examination (general) (routine) without abnormal findings: Secondary | ICD-10-CM

## 2023-06-26 DIAGNOSIS — G4730 Sleep apnea, unspecified: Secondary | ICD-10-CM

## 2023-06-26 DIAGNOSIS — R7303 Prediabetes: Secondary | ICD-10-CM

## 2023-06-26 DIAGNOSIS — E039 Hypothyroidism, unspecified: Secondary | ICD-10-CM

## 2023-06-26 NOTE — Patient Instructions (Signed)
Good to see you today . Blood sugar up but not diabetic . Choelsterol not as good. Intensify lifestyle interventions. As we discussed  Plan fu in 4-6 months and want to get repeat   lipid hg A1c  and bmp before visit

## 2023-06-26 NOTE — Progress Notes (Signed)
Lab orders pre 6 mos check visit

## 2023-06-28 LAB — CYTOLOGY - PAP
Comment: NEGATIVE
Diagnosis: NEGATIVE
High risk HPV: NEGATIVE

## 2023-07-01 NOTE — Progress Notes (Signed)
Pap normal neg hpv repeat in 4-5 years

## 2023-07-29 ENCOUNTER — Other Ambulatory Visit: Payer: Self-pay | Admitting: Internal Medicine

## 2023-07-29 ENCOUNTER — Other Ambulatory Visit: Payer: Self-pay | Admitting: Family

## 2023-07-29 NOTE — Telephone Encounter (Signed)
Prescription Request  07/29/2023  LOV: 06/26/2023  What is the name of the medication or equipment? amphetamine-dextroamphetamine (ADDERALL XR) 20 MG 24 hr capsule   Have you contacted your pharmacy to request a refill? No   Which pharmacy would you like this sent to?  [facility] [id number] - [place], [place] - [address] [place] [address] Phone: [phone] Fax: [phone]     Patient notified that their request is being sent to the clinical staff for review and that they should receive a response within 2 business days.   Please advise at Mobile [phone] (mobile)

## 2023-07-30 ENCOUNTER — Other Ambulatory Visit: Payer: Self-pay | Admitting: Family

## 2023-07-30 MED ORDER — AMPHETAMINE-DEXTROAMPHET ER 20 MG PO CP24: 40.0000 mg | ORAL_CAPSULE | Freq: Every day | ORAL | 0 refills | Status: DC

## 2023-07-30 NOTE — Telephone Encounter (Signed)
Spoke to pt to confirm on the med refill. Pt states amphetamine-dextroamphetamine 20 mg, is the one she needs a refill on to be send to requested pharmacy below.

## 2023-08-30 ENCOUNTER — Telehealth: Payer: Self-pay | Admitting: Internal Medicine

## 2023-08-30 ENCOUNTER — Other Ambulatory Visit: Payer: Self-pay | Admitting: Family

## 2023-08-30 MED ORDER — AMPHETAMINE-DEXTROAMPHET ER 20 MG PO CP24: 40.0000 mg | ORAL_CAPSULE | Freq: Every day | ORAL | 0 refills | Status: DC

## 2023-08-30 NOTE — Telephone Encounter (Signed)
Prescription Request  08/30/2023  LOV: 06/26/2023  What is the name of the medication or equipment? amphetamine-dextroamphetamine (ADDERALL XR) 20 MG 24 hr capsule (Expired)   Pt informed MD is OOO on Fridays.   Have you contacted your pharmacy to request a refill? No   Which pharmacy would you like this sent to?   [facility] [id number] - [place],  - [address] [place] [address] Phone: [phone] Fax: [phone]    Patient notified that their request is being sent to the clinical staff for review and that they should receive a response within 2 business days.   Please advise at Mobile [phone] (mobile)

## 2023-09-05 ENCOUNTER — Encounter: Payer: Self-pay | Admitting: Internal Medicine

## 2023-09-05 NOTE — Telephone Encounter (Signed)
Reach out to [facility] on [address]. They states they don't have enough for 4 x10mg  for 30 days but states [facility] on [address] seems to have it.   Attempted to reach pharmacy 3x. Unable to reach thru.   Contacted pt. Pt states she had tried to reach out several pharmacy to see if they have 20mg . None had it.   Inform pt of info above. Advise pt to call around [facility] on [address] or other pharmacy for 2 x 20mg  or 4 x 10mg . Verbalized understanding.

## 2023-09-05 NOTE — Telephone Encounter (Signed)
Please send rx  amphetamine-dextroamphetamine (ADDERALL XR) 10 MG 24 hr capsule  [facility] [id number] [place], [place] - [address] Phone: [phone]  Fax: [phone]    They have enough in stock for Take 4 capsules (40 mg total) by mouth daily.

## 2023-09-05 NOTE — Telephone Encounter (Signed)
Have pharmacy contact  and confirm that they will fill 4 x 10 mg adderall xr per day for 30 days before sending in .

## 2023-09-05 NOTE — Telephone Encounter (Signed)
Pt requesting a new prescription for her amphetamine-dextroamphetamine (ADDERALL XR) 20 MG 24 hr capsule, says pharmacy does not have 20s in stock now, she needs it written for 10s to equal 40 mg [facility] [id number] - [place], [place] - [address] Phone: [phone]  Fax: [phone]

## 2023-09-05 NOTE — Telephone Encounter (Signed)
Error/njr °

## 2023-09-09 NOTE — Telephone Encounter (Signed)
Pt called to F/U on this refill request. Pt informed MD does not work on Mondays or Fridays. Pt stated pharmacy only has 10 mg tablets, so she would like Rx to be for (4) 10 mg tablets a day.  (40 mgs a day)  Please advise.

## 2023-09-10 MED ORDER — AMPHETAMINE-DEXTROAMPHET ER 10 MG PO CP24: 40.0000 mg | ORAL_CAPSULE | Freq: Every day | ORAL | 0 refills | Status: DC

## 2023-09-10 NOTE — Addendum Note (Signed)
Addended byBerniece Andreas K on: 09/10/2023 07:02 PM   Modules accepted: Orders

## 2023-09-11 NOTE — Telephone Encounter (Signed)
Spoke to pt and update her about the Rx.  Pt states she did receive a notification from pharmacy but not sure if it is ready. Advise pt to call her pharmacy to make sure they have it. Verbalized understanding.

## 2023-09-18 ENCOUNTER — Telehealth: Payer: Self-pay

## 2023-09-18 ENCOUNTER — Telehealth: Payer: Self-pay | Admitting: Internal Medicine

## 2023-09-18 DIAGNOSIS — E039 Hypothyroidism, unspecified: Secondary | ICD-10-CM

## 2023-09-18 NOTE — Telephone Encounter (Signed)
Received a fax from [facility] regarding to Medical Clarification Request on Levothyroxine.    It states" the manufacturer of generic for LEVOTHYROXINE 75MG  is changing from AMNEAL to LUPIN.   Requesting provider to approve.   Form placed in provider's red folder.   Please advise.

## 2023-09-18 NOTE — Telephone Encounter (Signed)
See previous message   ok to change but will need tsh done 3 mos after change if ok with patient

## 2023-09-18 NOTE — Telephone Encounter (Signed)
[facility] called to request a change in manufacturer for the  levothyroxine (SYNTHROID) 175 MCG tablet  From:  Amneal to Lupin  [phone]  *Please add approval for change to the Note Section*

## 2023-09-18 NOTE — Telephone Encounter (Signed)
Ok to Scientific laboratory technician with patients knowledge and then recheck tsh in 3 months after the change

## 2023-09-19 NOTE — Telephone Encounter (Signed)
See other encounter.

## 2023-09-19 NOTE — Telephone Encounter (Signed)
Attempted to reach pt. Left a voicemail to call us back.

## 2023-09-24 NOTE — Addendum Note (Signed)
Addended by: Vickii Chafe on: 09/24/2023 02:33 PM   Modules accepted: Orders

## 2023-09-24 NOTE — Telephone Encounter (Signed)
Spoke to pt. Pt inform she just pick up her old prescription with previous manufacturer. Pt states she will finish that. Once she started with new manu med, she will reach out to [place].    Lab order placed. No further action is needed.

## 2023-09-25 ENCOUNTER — Telehealth: Payer: Self-pay

## 2023-09-25 NOTE — Telephone Encounter (Signed)
Reach out to [employer] and spoke to [place]. Made her aware that provider is okay about changing manufacturer from AMNEAL to LUPIN.   No further action is needed.

## 2023-10-16 ENCOUNTER — Other Ambulatory Visit: Payer: Self-pay | Admitting: Internal Medicine

## 2023-10-28 NOTE — Progress Notes (Unsigned)
No chief complaint on file.   HPI: Victoria Carter 58 y.o. with hx adhd hld ht   hypthyroid  OSA come in for   Last visit 6 24  ROS: See pertinent positives and negatives per HPI.  Past Medical History:  Diagnosis Date   Allergic rhinitis    Depression    Hx of abnormal cervical Pap smear    used cryo when first married and pregnant   Hyperglycemia    Hyperlipidemia    Sinusitis    UTI (lower urinary tract infection)    Vaginitis and vulvovaginitis 11/04/2013   poss early yeast  . rx disc topical otc first and add diflucan if needed Drug IA discussed     Family History  Problem Relation Age of Onset   Atrial fibrillation Mother    Heart attack Brother        age 26   Diabetes type II Other    Hyperlipidemia Other    Factor V Leiden deficiency Daughter        Also husband    Social History   Socioeconomic History   Marital status: Divorced    Spouse name: Not on file   Number of children: Not on file   Years of education: Not on file   Highest education level: Some college, no degree  Occupational History   Not on file  Tobacco Use   Smoking status: Former    Current packs/day: 0.00    Average packs/day: 0.5 packs/day for 10.0 years (5.0 ttl pk-yrs)    Types: Cigarettes    Start date: 04/27/2012    Quit date: 04/27/2022    Years since quitting: 1.5   Smokeless tobacco: Never   Tobacco comments:    Pt reports still smokes every now and then. During break from work. 06/26/2023. km   Vaping Use   Vaping status: Never Used  Substance and Sexual Activity   Alcohol use: Yes    Alcohol/week: 1.0 standard drink of alcohol    Types: 1 Glasses of wine per week   Drug use: No   Sexual activity: Not on file  Other Topics Concern   Not on file  Social History Narrative   Occupation: 40 per week 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  [employer]  In stress position    Married now separated  2017/11/19  Divorced    Regular exercise- yes  yoga   HH of 1-  girls home from college   Pet dogs passed away  2016-11-19   G2P2   Social Determinants of Health   Financial Resource Strain: Low Risk  (02/05/2022)   Overall Financial Resource Strain (CARDIA)    Difficulty of Paying Living Expenses: Not very hard  Food Insecurity: No Food Insecurity (02/05/2022)   Hunger Vital Sign    Worried About Running Out of Food in the Last Year: Never true    Ran Out of Food in the Last Year: Never true  Transportation Needs: No Transportation Needs (02/05/2022)   PRAPARE - Administrator, Civil Service (Medical): No    Lack of Transportation (Non-Medical): No  Physical Activity: Insufficiently Active (06/26/2023)   Exercise Vital Sign    Days of Exercise per Week: 2 days    Minutes of Exercise per Session: 10 min  Stress: Stress Concern Present (02/05/2022)   [employer] of Occupational Health - Occupational Stress Questionnaire    Feeling of Stress : Very much  Social Connections: Unknown (02/05/2022)   Social Connection and  Isolation Panel [NHANES]    Frequency of Communication with Friends and Family: Once a week    Frequency of Social Gatherings with Friends and Family: Patient declined    Attends Religious Services: Patient declined    Database administrator or Organizations: No    Attends Engineer, structural: Not on file    Marital Status: Divorced    Outpatient Medications Prior to Visit  Medication Sig Dispense Refill   amoxicillin (AMOXIL) 500 MG capsule Take 1,000 mg by mouth 2 (two) times daily. (Patient not taking: Reported on 06/26/2023)     amphetamine-dextroamphetamine (ADDERALL XR) 10 MG 24 hr capsule Take 4 capsules (40 mg total) by mouth daily. Shortage of 20 mg xr 120 capsule 0   amphetamine-dextroamphetamine (ADDERALL XR) 20 MG 24 hr capsule Take 2 capsules (40 mg total) by mouth daily. [employer] can do generic  as per insurance 60 capsule 0   atorvastatin (LIPITOR) 40 MG tablet TAKE 1 TABLET BY MOUTH DAILY 90 tablet 3   Azelastine HCl 137 MCG/SPRAY SOLN Place  into both nostrils. (Patient not taking: Reported on 06/26/2023)     Calcium 500 MG CHEW 1 chewable tablet daily 30 tablet 6   FLUoxetine (PROZAC) 20 MG capsule TAKE 2 CAPSULES BY MOUTH DAILY 180 capsule 3   levothyroxine (SYNTHROID) 75 MCG tablet TAKE 1 TABLET BY MOUTH DAILY  WITH BREAKFAST 90 tablet 3   Multiple Vitamins-Minerals ([employer] WOMENS DAILY GUMMIES) CHEW Chew 2 Units by mouth daily. 60 tablet 6   No facility-administered medications prior to visit.     EXAM:  LMP 04/15/2012   There is no height or weight on file to calculate BMI.  GENERAL: vitals reviewed and listed above, alert, oriented, appears well hydrated and in no acute distress HEENT: atraumatic, conjunctiva  clear, no obvious abnormalities on inspection of external nose and ears OP : no lesion edema or exudate  NECK: no obvious masses on inspection palpation  LUNGS: clear to auscultation bilaterally, no wheezes, rales or rhonchi, good air movement CV: HRRR, no clubbing cyanosis or  peripheral edema nl cap refill  MS: moves all extremities without noticeable focal  abnormality PSYCH: pleasant and cooperative, no obvious depression or anxiety Lab Results  Component Value Date   WBC 4.9 06/19/2023   HGB 14.1 06/19/2023   HCT 42.6 06/19/2023   PLT 275.0 06/19/2023   GLUCOSE 131 (H) 06/19/2023   CHOL 190 06/19/2023   TRIG 89.0 06/19/2023   HDL 47.50 06/19/2023   LDLDIRECT 88.6 11/01/2014   LDLCALC 125 (H) 06/19/2023   ALT 28 06/19/2023   AST 25 06/19/2023   NA 140 06/19/2023   K 4.3 06/19/2023   CL 105 06/19/2023   CREATININE 1.03 06/19/2023   BUN 14 06/19/2023   CO2 27 06/19/2023   TSH 2.99 06/19/2023   INR 0.9 07/30/2019   HGBA1C 5.7 06/19/2023   BP Readings from Last 3 Encounters:  06/26/23 114/80  10/16/22 124/78  09/27/22 112/72    ASSESSMENT AND PLAN:  Discussed the following assessment and plan:  No diagnosis found.  -Patient advised to return or notify health care team  if  new concerns  arise.  There are no Patient Instructions on file for this visit.   Neta Mends. Uday Jantz M.D.

## 2023-10-29 ENCOUNTER — Ambulatory Visit (INDEPENDENT_AMBULATORY_CARE_PROVIDER_SITE_OTHER): Payer: 59

## 2023-10-29 ENCOUNTER — Encounter: Payer: Self-pay | Admitting: Internal Medicine

## 2023-10-29 ENCOUNTER — Ambulatory Visit (INDEPENDENT_AMBULATORY_CARE_PROVIDER_SITE_OTHER): Payer: 59 | Admitting: Internal Medicine

## 2023-10-29 VITALS — BP 144/90 | HR 79 | Temp 99.2°F | Ht 66.0 in | Wt 212.8 lb

## 2023-10-29 DIAGNOSIS — Z79899 Other long term (current) drug therapy: Secondary | ICD-10-CM

## 2023-10-29 DIAGNOSIS — F439 Reaction to severe stress, unspecified: Secondary | ICD-10-CM | POA: Diagnosis not present

## 2023-10-29 DIAGNOSIS — R079 Chest pain, unspecified: Secondary | ICD-10-CM | POA: Diagnosis not present

## 2023-10-29 LAB — BASIC METABOLIC PANEL
BUN: 10 mg/dL (ref 6–23)
CO2: 29 meq/L (ref 19–32)
Calcium: 9.7 mg/dL (ref 8.4–10.5)
Chloride: 104 meq/L (ref 96–112)
Creatinine, Ser: 1 mg/dL (ref 0.40–1.20)
GFR: 61.64 mL/min (ref >60.00)
Glucose, Bld: 106 mg/dL — ABNORMAL HIGH (ref 70–99)
Potassium: 4.5 meq/L (ref 3.5–5.1)
Sodium: 140 meq/L (ref 135–145)

## 2023-10-29 LAB — CBC WITH DIFFERENTIAL/PLATELET
Basophils Absolute: 0 10*3/uL (ref 0.0–0.1)
Basophils Relative: 0.4 % (ref 0.0–3.0)
Eosinophils Absolute: 0.1 10*3/uL (ref 0.0–0.7)
Eosinophils Relative: 1.9 % (ref 0.0–5.0)
HCT: 45.6 % (ref 36.0–46.0)
Hemoglobin: 14.9 g/dL (ref 12.0–15.0)
Lymphocytes Relative: 21 % (ref 12.0–46.0)
Lymphs Abs: 1.4 10*3/uL (ref 0.7–4.0)
MCHC: 32.6 g/dL (ref 30.0–36.0)
MCV: 91.3 fL (ref 78.0–100.0)
Monocytes Absolute: 0.5 10*3/uL (ref 0.1–1.0)
Monocytes Relative: 7.5 % (ref 3.0–12.0)
Neutro Abs: 4.6 10*3/uL (ref 1.4–7.7)
Neutrophils Relative %: 69.2 % (ref 43.0–77.0)
Platelets: 279 10*3/uL (ref 150.0–400.0)
RBC: 5 Mil/uL (ref 3.87–5.11)
RDW: 13.7 % (ref 11.5–15.5)
WBC: 6.6 10*3/uL (ref 4.0–10.5)

## 2023-10-29 LAB — SEDIMENTATION RATE: Sed Rate: 19 mm/h (ref 0–30)

## 2023-10-29 NOTE — Progress Notes (Signed)
Nsr no acute findings

## 2023-10-29 NOTE — Patient Instructions (Signed)
EKG ok  Check x ray  and lab today  This could be chest wall pain inflammation  but want to make sure not heart of lung issues.   Ibuprofen  short term is ok if  helps.  Fu depending  if  persistent or progressive  over  2 weeks  and other eval is ok

## 2023-10-29 NOTE — Progress Notes (Signed)
X ray shows  no acute findings   some  chronic lung changes c/w smoking history . Awaiting the rest of labs

## 2023-11-05 NOTE — Progress Notes (Signed)
Blood work results are stable and no sig abnormality

## 2023-11-05 NOTE — Progress Notes (Signed)
Ok to use ibu for another week if needed since had dental procedure and if ok with dentist . Yes stress and chest wall inflammation   are possible . Make a fu visit in 2 weeks for fu unless all better .

## 2023-11-12 ENCOUNTER — Other Ambulatory Visit: Payer: Self-pay | Admitting: Internal Medicine

## 2023-11-12 DIAGNOSIS — Z1231 Encounter for screening mammogram for malignant neoplasm of breast: Secondary | ICD-10-CM

## 2023-11-27 ENCOUNTER — Other Ambulatory Visit: Payer: Self-pay | Admitting: Internal Medicine

## 2023-11-27 MED ORDER — AMPHETAMINE-DEXTROAMPHET ER 20 MG PO CP24: 40.0000 mg | ORAL_CAPSULE | Freq: Every day | ORAL | 0 refills | Status: DC

## 2023-11-27 NOTE — Telephone Encounter (Signed)
Prescription Request  11/27/2023  LOV: 10/29/2023  What is the name of the medication or equipment? amphetamine-dextroamphetamine (ADDERALL XR) 20 MG 24 hr capsule   Have you contacted your pharmacy to request a refill? No   Which pharmacy would you like this sent to?  [facility] [id number] - [place], [place] - [address] [place] [address] Phone: [phone] Fax: [phone]     Patient notified that their request is being sent to the clinical staff for review and that they should receive a response within 2 business days.   Please advise at Mobile [phone] (mobile)

## 2023-12-02 NOTE — Telephone Encounter (Signed)
Rx sent 

## 2023-12-26 ENCOUNTER — Telehealth: Payer: Self-pay

## 2023-12-26 ENCOUNTER — Other Ambulatory Visit: Payer: Self-pay | Admitting: Internal Medicine

## 2023-12-26 NOTE — Telephone Encounter (Unsigned)
Copied from CRM [id number]. Topic: Clinical - Prescription Issue >> Dec 26, 2023  1:39 PM Pascal Lux wrote: Reason for CRM: Patient called for medication refill. Attempted to route CRM # [id number] but due to system error was resolved instead. Please review CRM # [id number] for medication refill.

## 2023-12-26 NOTE — Telephone Encounter (Signed)
Copied from CRM [id number]. Topic: Clinical - Medication Refill >> Dec 26, 2023  1:26 PM Pascal Lux wrote: Most Recent Primary Care Visit:  Provider: Berniece Andreas K  Department: [facility]  Visit Type: OFFICE VISIT  Date: 10/29/2023  Medication: amphetamine-dextroamphetamine (ADDERALL XR) 20 MG 24 hr capsule [[id number]]  Has the patient contacted their pharmacy? No (Agent: If no, request that the patient contact the pharmacy for the refill. If patient does not wish to contact the pharmacy document the reason why and proceed with request.) (Agent: If yes, when and what did the pharmacy advise?)  Is this the correct pharmacy for this prescription? Yes If no, delete pharmacy and type the correct one.  This is the patient's preferred pharmacy:  [facility] [id number] - [place], [place] - [address] [place] [address] Phone: [phone] Fax: [phone]  [facility] - [place], [place] - [phone] [address] [place] [address] Phone: [phone] Fax: [phone]   Has the prescription been filled recently? Yes  Is the patient out of the medication? No, soon will be.  Has the patient been seen for an appointment in the last year OR does the patient have an upcoming appointment? Yes  Can we respond through MyChart? Yes  Agent: Please be advised that Rx refills may take up to 3 business days. We ask that you follow-up with your pharmacy.

## 2023-12-27 MED ORDER — AMPHETAMINE-DEXTROAMPHET ER 20 MG PO CP24: 40.0000 mg | ORAL_CAPSULE | Freq: Every day | ORAL | 0 refills | Status: DC

## 2024-01-07 ENCOUNTER — Other Ambulatory Visit: Payer: Self-pay | Admitting: Family

## 2024-01-13 ENCOUNTER — Ambulatory Visit
Admission: RE | Admit: 2024-01-13 | Discharge: 2024-01-13 | Disposition: A | Discharge status: Home / Self Care | Payer: 59 | Source: Ambulatory Visit | Attending: Internal Medicine | Admitting: Internal Medicine

## 2024-01-13 DIAGNOSIS — Z1231 Encounter for screening mammogram for malignant neoplasm of breast: Secondary | ICD-10-CM

## 2024-02-04 ENCOUNTER — Other Ambulatory Visit: Payer: Self-pay

## 2024-02-04 NOTE — Addendum Note (Signed)
Addended by: Vickii Chafe on: 02/04/2024 03:48 PM   Modules accepted: Orders

## 2024-02-04 NOTE — Telephone Encounter (Signed)
 Copied from CRM [id number]. Topic: Clinical - Prescription Issue >> Feb 04, 2024  3:01 PM Robinson H wrote: Reason for CRM: Patient is calling to refill prescription for amphetamine -dextroamphetamine (ADDERALL XR) 20 MG 24 hr capsule, which shows prescription ended in patients profile.  Allsion [phone]

## 2024-02-05 MED ORDER — AMPHETAMINE-DEXTROAMPHET ER 20 MG PO CP24: 40.0000 mg | ORAL_CAPSULE | Freq: Every day | ORAL | 0 refills | Status: DC

## 2024-02-19 ENCOUNTER — Other Ambulatory Visit: Payer: Self-pay | Admitting: Family

## 2024-02-24 ENCOUNTER — Other Ambulatory Visit: Payer: 59

## 2024-02-27 ENCOUNTER — Telehealth: Payer: Self-pay | Admitting: Internal Medicine

## 2024-02-27 ENCOUNTER — Other Ambulatory Visit (INDEPENDENT_AMBULATORY_CARE_PROVIDER_SITE_OTHER): Payer: 59

## 2024-02-27 DIAGNOSIS — R7303 Prediabetes: Secondary | ICD-10-CM

## 2024-02-27 DIAGNOSIS — E782 Mixed hyperlipidemia: Secondary | ICD-10-CM

## 2024-02-27 DIAGNOSIS — R7301 Impaired fasting glucose: Secondary | ICD-10-CM | POA: Diagnosis not present

## 2024-02-27 DIAGNOSIS — E039 Hypothyroidism, unspecified: Secondary | ICD-10-CM

## 2024-02-27 DIAGNOSIS — Z79899 Other long term (current) drug therapy: Secondary | ICD-10-CM | POA: Diagnosis not present

## 2024-02-27 LAB — LIPID PANEL
Cholesterol: 178 mg/dL (ref 0–200)
HDL: 46.7 mg/dL (ref >39.00)
LDL Cholesterol: 108 mg/dL — ABNORMAL HIGH (ref 0–99)
NonHDL: 131.54
Total CHOL/HDL Ratio: 4
Triglycerides: 119 mg/dL (ref 0.0–149.0)
VLDL: 23.8 mg/dL (ref 0.0–40.0)

## 2024-02-27 LAB — BASIC METABOLIC PANEL
BUN: 19 mg/dL (ref 6–23)
CO2: 26 meq/L (ref 19–32)
Calcium: 8.8 mg/dL (ref 8.4–10.5)
Chloride: 104 meq/L (ref 96–112)
Creatinine, Ser: 1.02 mg/dL (ref 0.40–1.20)
GFR: 60.05 mL/min (ref >60.00)
Glucose, Bld: 116 mg/dL — ABNORMAL HIGH (ref 70–99)
Potassium: 3.8 meq/L (ref 3.5–5.1)
Sodium: 139 meq/L (ref 135–145)

## 2024-02-27 LAB — HEMOGLOBIN A1C: Hgb A1c MFr Bld: 5.9 % (ref 4.6–6.5)

## 2024-02-27 LAB — TSH: TSH: 4.27 u[IU]/mL (ref 0.35–5.50)

## 2024-02-27 NOTE — Telephone Encounter (Signed)
 Pt states she came in person for labs today, but didn't realized she needed to be fasting for her lipid panel. She would like the care team to know that she was not fasting so if she needs to come back to redo her lipid panel let her know.

## 2024-03-02 ENCOUNTER — Encounter: Payer: Self-pay | Admitting: Internal Medicine

## 2024-03-02 NOTE — Progress Notes (Signed)
 Blood sugar  borderline   up pre diabetes  but no diabetes  cholesterol better than last check  kidney function stable  in normal range  Continue meds and Continue lifestyle intervention healthy eating and exercise .  Plan cpe in summer  with med check

## 2024-03-04 ENCOUNTER — Other Ambulatory Visit: Payer: Self-pay

## 2024-03-04 MED ORDER — AMPHETAMINE-DEXTROAMPHET ER 20 MG PO CP24: 40.0000 mg | ORAL_CAPSULE | Freq: Every day | ORAL | 0 refills | Status: DC

## 2024-03-04 MED ORDER — LEVOTHYROXINE SODIUM 75 MCG PO TABS: 75.0000 ug | ORAL_TABLET | Freq: Every day | ORAL | 3 refills | Status: DC

## 2024-03-04 NOTE — Progress Notes (Signed)
 NO we can discuss at next visit  it is  ok for non fasting

## 2024-04-01 ENCOUNTER — Encounter: Payer: Self-pay | Admitting: Internal Medicine

## 2024-04-01 ENCOUNTER — Other Ambulatory Visit: Payer: Self-pay | Admitting: Internal Medicine

## 2024-04-02 MED ORDER — ATORVASTATIN CALCIUM 40 MG PO TABS: 40.0000 mg | ORAL_TABLET | Freq: Every day | ORAL | 3 refills | Status: DC

## 2024-04-02 MED ORDER — AMPHETAMINE-DEXTROAMPHET ER 20 MG PO CP24
40.0000 mg | ORAL_CAPSULE | Freq: Every day | ORAL | 0 refills | Status: DC
Start: 2024-04-02 — End: 2024-04-28

## 2024-04-28 ENCOUNTER — Other Ambulatory Visit: Payer: Self-pay | Admitting: Internal Medicine

## 2024-04-29 MED ORDER — AMPHETAMINE-DEXTROAMPHET ER 20 MG PO CP24: 40.0000 mg | ORAL_CAPSULE | Freq: Every day | ORAL | 0 refills | Status: DC

## 2024-06-01 ENCOUNTER — Other Ambulatory Visit: Payer: Self-pay | Admitting: Internal Medicine

## 2024-06-01 NOTE — Telephone Encounter (Signed)
 Copied from CRM [id number]. Topic: Clinical - Medication Refill >> Jun 01, 2024 12:34 PM Freya Jesus wrote: Medication: amphetamine -dextroamphetamine (ADDERALL XR) 10 MG 24 hr capsule [[id number]]  Has the patient contacted their pharmacy? No (Agent: If no, request that the patient contact the pharmacy for the refill. If patient does not wish to contact the pharmacy document the reason why and proceed with request.) (Agent: If yes, when and what did the pharmacy advise?)  This is the patient's preferred pharmacy:  [facility] [id number] - [place],  - [address] [place] [address] Phone: [phone] Fax: [phone]  Is this the correct pharmacy for this prescription? Yes If no, delete pharmacy and type the correct one.   Has the prescription been filled recently? No  Is the patient out of the medication? No, two days left  Has the patient been seen for an appointment in the last year OR does the patient have an upcoming appointment? Yes  Can we respond through MyChart? Yes  Agent: Please be advised that Rx refills may take up to 3 business days. We ask that you follow-up with your pharmacy.

## 2024-06-04 MED ORDER — AMPHETAMINE-DEXTROAMPHET ER 20 MG PO CP24: 40.0000 mg | ORAL_CAPSULE | Freq: Every day | ORAL | 0 refills | Status: DC

## 2024-06-30 ENCOUNTER — Ambulatory Visit (INDEPENDENT_AMBULATORY_CARE_PROVIDER_SITE_OTHER): Admitting: Internal Medicine

## 2024-06-30 ENCOUNTER — Encounter: Payer: Self-pay | Admitting: Internal Medicine

## 2024-06-30 ENCOUNTER — Other Ambulatory Visit: Payer: Self-pay | Admitting: Internal Medicine

## 2024-06-30 VITALS — BP 128/74 | HR 87 | Temp 98.0°F | Ht 65.9 in | Wt 217.6 lb

## 2024-06-30 DIAGNOSIS — Z720 Tobacco use: Secondary | ICD-10-CM

## 2024-06-30 DIAGNOSIS — R7301 Impaired fasting glucose: Secondary | ICD-10-CM

## 2024-06-30 DIAGNOSIS — Z0000 Encounter for general adult medical examination without abnormal findings: Secondary | ICD-10-CM | POA: Diagnosis not present

## 2024-06-30 DIAGNOSIS — E782 Mixed hyperlipidemia: Secondary | ICD-10-CM

## 2024-06-30 DIAGNOSIS — F902 Attention-deficit hyperactivity disorder, combined type: Secondary | ICD-10-CM | POA: Diagnosis not present

## 2024-06-30 DIAGNOSIS — E039 Hypothyroidism, unspecified: Secondary | ICD-10-CM | POA: Diagnosis not present

## 2024-06-30 DIAGNOSIS — Z79899 Other long term (current) drug therapy: Secondary | ICD-10-CM

## 2024-06-30 MED ORDER — AMPHETAMINE-DEXTROAMPHET ER 20 MG PO CP24: 40.0000 mg | ORAL_CAPSULE | Freq: Every day | ORAL | 0 refills | Status: DC

## 2024-06-30 NOTE — Progress Notes (Signed)
 Chief Complaint  Patient presents with   Annual Exam    Pt is here for physical. Reports she is taking CoQ10 200mg  and Lutein.    Medication Refill    HPI: Patient  Victoria Carter  59 y.o. comes in today for Preventive Health Care visit  and med checks . Adhd  take   40 xr in am  helps attention .  Hdl atorvastatin  and added  co q 10  Also added luteun cause daughter has one eye with MD Thyroid  : taking  me regular  Some counseling : but doing ok  now since change job positions much better   Travel to help family [place]   Health Maintenance  Topic Date Due   COVID-19 Vaccine (4 - 2024-25 season) 07/16/2024 (Originally 09/01/2023)   INFLUENZA VACCINE  07/31/2024   MAMMOGRAM  01/12/2026   Cervical Cancer Screening (HPV/Pap Cotest)  06/25/2028   DTaP/Tdap/Td (4 - Td or Tdap) 02/08/2032   Colonoscopy  03/19/2032   Hepatitis C Screening  Completed   HIV Screening  Completed   Zoster Vaccines- Shingrix   Completed   Hepatitis B Vaccines  Aged Out   HPV VACCINES  Aged Out   Meningococcal B Vaccine  Aged Out   Health Maintenance Review LIFESTYLE:  Exercise:  adls and mowing etc .  Tobacco/ETS:  1/2 ppd or less  Alcohol:  ocass Sugar beverages:   diet coke  Sleep: 7-8 hours  Drug use: no HH of  1  dog  Work: better situation  demotion and away form toxic stress Grandkids moved to [place]   mom had fx  ash Wednesday. Family helping.    ROS:  GEN/ HEENT: No fever, significant weight changes sweats headaches vision problems hearing changes, CV/ PULM; No chest pain shortness of breath cough, syncope,edema  change in exercise tolerance. GI /GU: No adominal pain, vomiting, change in bowel habits. No blood in the stool. No significant GU symptoms. SKIN/HEME: ,no acute skin rashes suspicious lesions or bleeding. No lymphadenopathy, nodules, masses.  NEURO/ PSYCH:  No neurologic signs such as weakness numbness. No depression anxiety. IMM/ Allergy: No unusual infections.  Allergy  .   REST of 12 system review negative except as per HPI   Past Medical History:  Diagnosis Date   Allergic rhinitis    Depression    Hx of abnormal cervical Pap smear    used cryo when first married and pregnant   Hyperglycemia    Hyperlipidemia    Sinusitis    UTI (lower urinary tract infection)    Vaginitis and vulvovaginitis 11/04/2013   poss early yeast  . rx disc topical otc first and add diflucan  if needed Drug IA discussed     Past Surgical History:  Procedure Laterality Date   BREAST BIOPSY  1987   BREAST EXCISIONAL BIOPSY Right    CERVICAL BIOPSY     CESAREAN SECTION     SHOULDER SURGERY Left 05/01/2022    Family History  Problem Relation Age of Onset   Atrial fibrillation Mother    Heart attack Brother        age 33   Diabetes type II Other    Hyperlipidemia Other    Factor V Leiden deficiency Daughter        Also husband    Social History   Socioeconomic History   Marital status: Divorced    Spouse name: Not on file   Number of children: Not on file   Years of education: Not  on file   Highest education level: Some college, no degree  Occupational History   Not on file  Tobacco Use   Smoking status: Former    Current packs/day: 0.00    Average packs/day: 0.5 packs/day for 10.0 years (5.0 ttl pk-yrs)    Types: Cigarettes    Start date: 04/27/2012    Quit date: 04/27/2022    Years since quitting: 2.1   Smokeless tobacco: Never   Tobacco comments:    Pt reports still smokes every now and then. During break from work. 06/26/2023. km   Vaping Use   Vaping status: Never Used  Substance and Sexual Activity   Alcohol use: Yes    Alcohol/week: 1.0 standard drink of alcohol    Types: 1 Glasses of wine per week   Drug use: No   Sexual activity: Not on file  Other Topics Concern   Not on file  Social History Narrative   Occupation: 40 per week 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  [employer]  In stress position    Married now separated  07-Jul-2017  Divorced    Regular exercise- yes   yoga   HH of 1-  girls home from college   Pet dogs passed away July 07, 2016   G2P2   Social Drivers of Health   Financial Resource Strain: Low Risk  (06/29/2024)   Overall Financial Resource Strain (CARDIA)    Difficulty of Paying Living Expenses: Not hard at all  Food Insecurity: No Food Insecurity (06/29/2024)   Hunger Vital Sign    Worried About Running Out of Food in the Last Year: Never true    Ran Out of Food in the Last Year: Never true  Transportation Needs: No Transportation Needs (06/29/2024)   PRAPARE - Administrator, Civil Service (Medical): No    Lack of Transportation (Non-Medical): No  Physical Activity: Insufficiently Active (06/29/2024)   Exercise Vital Sign    Days of Exercise per Week: 3 days    Minutes of Exercise per Session: 40 min  Stress: No Stress Concern Present (06/29/2024)   [employer] of Occupational Health - Occupational Stress Questionnaire    Feeling of Stress: Only a little  Social Connections: Socially Isolated (06/29/2024)   Social Connection and Isolation Panel    Frequency of Communication with Friends and Family: More than three times a week    Frequency of Social Gatherings with Friends and Family: Once a week    Attends Religious Services: Patient declined    Database administrator or Organizations: No    Attends Engineer, structural: Not on file    Marital Status: Divorced    Outpatient Medications Prior to Visit  Medication Sig Dispense Refill   atorvastatin  (LIPITOR) 40 MG tablet Take 1 tablet (40 mg total) by mouth daily. 90 tablet 3   Coenzyme Q10 (COQ10) 200 MG CAPS Take by mouth.     FLUoxetine  (PROZAC ) 20 MG capsule TAKE 2 CAPSULES BY MOUTH DAILY 180 capsule 3   levothyroxine  (SYNTHROID ) 75 MCG tablet Take 1 tablet (75 mcg total) by mouth daily with breakfast. 90 tablet 3   LUTEIN PO Take by mouth.     Multiple Vitamins-Minerals ([employer] WOMENS DAILY GUMMIES) CHEW Chew 2 Units by mouth daily. 60 tablet 6    amphetamine -dextroamphetamine (ADDERALL XR) 20 MG 24 hr capsule Take 2 capsules (40 mg total) by mouth daily. [employer] can do generic  as per insurance 60 capsule 0   Azelastine HCl 137 MCG/SPRAY  SOLN Place into both nostrils. (Patient not taking: Reported on 06/30/2024)     Calcium  500 MG CHEW 1 chewable tablet daily (Patient not taking: Reported on 06/30/2024) 30 tablet 6   amoxicillin  (AMOXIL ) 500 MG capsule Take 1,000 mg by mouth 2 (two) times daily. (Patient not taking: Reported on 06/26/2023)     amphetamine -dextroamphetamine (ADDERALL XR) 10 MG 24 hr capsule Take 4 capsules (40 mg total) by mouth daily. Shortage of 20 mg xr (Patient not taking: Reported on 10/29/2023) 120 capsule 0   No facility-administered medications prior to visit.     EXAM:  BP 128/74   Pulse 87   Temp 98 F (36.7 C)   Ht 5' 5.9 (1.674 m)   Wt 217 lb 9.6 oz (98.7 kg)   LMP 04/15/2012   SpO2 97%   BMI 35.23 kg/m   Body mass index is 35.23 kg/m. Wt Readings from Last 3 Encounters:  06/30/24 217 lb 9.6 oz (98.7 kg)  10/29/23 212 lb 12.8 oz (96.5 kg)  06/26/23 218 lb 12.8 oz (99.2 kg)    Physical Exam: Vital signs reviewed HZW:Uypd is a well-developed well-nourished alert cooperative    who appearsr stated age in no acute distress.  HEENT: normocephalic atraumatic , Eyes: PERRL EOM's full, conjunctiva clear, Nares: paten,t no deformity discharge or tenderness., Ears: no deformity EAC's clear TMs with normal landmarks. Mouth: clear OP, no lesions, edema.  Moist mucous membranes. Dentition in adequate repair. NECK: supple without masses, thyromegaly or bruits. CHEST/PULM:  Clear to auscultation and percussion breath sounds equal no wheeze , rales or rhonchi. No chest wall deformities or tenderness. Breast: normal by inspection . No dimpling, discharge, masses, tenderness or discharge . CV: PMI is nondisplaced, S1 S2 no gallops, murmurs, rubs. Peripheral pulses are full without delay.No JVD .  ABDOMEN:  Bowel sounds normal nontender  No guard or rebound, no hepato splenomegal no CVA tenderness.  Extremtities:  No clubbing cyanosis or edema, no acute joint swelling or redness no focal atrophy VV  nopain or lesions  NEURO:  Oriented x3,  gross motor activity  fidgety no tremor  cranial nerves 3-12 appear to be intact, no obvious focal weakness,gait within normal limits no abnormal reflexes or asymmetrical SKIN: No acute rashes normal turgor, color, no bruising or petechiae. PSYCH: Oriented, good eye contact, no obvious depression anxiety, cognition and judgment appear normal. LN: no cervical axillarydenopathy  Lab Results  Component Value Date   WBC 6.6 10/29/2023   HGB 14.9 10/29/2023   HCT 45.6 10/29/2023   PLT 279.0 10/29/2023   GLUCOSE 116 (H) 02/27/2024   CHOL 178 02/27/2024   TRIG 119.0 02/27/2024   HDL 46.70 02/27/2024   LDLDIRECT 88.6 11/01/2014   LDLCALC 108 (H) 02/27/2024   ALT 28 06/19/2023   AST 25 06/19/2023   NA 139 02/27/2024   K 3.8 02/27/2024   CL 104 02/27/2024   CREATININE 1.02 02/27/2024   BUN 19 02/27/2024   CO2 26 02/27/2024   TSH 4.27 02/27/2024   INR 0.9 07/30/2019   HGBA1C 5.9 02/27/2024    BP Readings from Last 3 Encounters:  06/30/24 128/74  10/29/23 (!) 144/90  06/26/23 114/80    Lab results reviewed with patient   ASSESSMENT AND PLAN:  Discussed the following assessment and plan:    ICD-10-CM   1. Visit for preventive health examination  Z00.00     2. Hypothyroidism, unspecified type  E03.9     3. Mixed hyperlipidemia  E78.2  4. Medication management  Z79.899     5. Attention deficit hyperactivity disorder (ADHD), combined type  F90.2     6. Tobacco use  Z72.0     Consdier adding zetia  if needed  But get fasting lab  in next weeks and then 6 mos med check  Gald she is doing better   but  needs to stop tobacco  and add resistance exercise for prevention  Return in about 6 months (around 12/31/2024) for med check  also make  fasting lab appt in next weeks .  Patient Care Team: Ludmilla Mcgillis, Apolinar POUR, MD as PCP - Diedre Robinson Pao, MD (Dermatology) Kristie Lamprey, MD as Consulting Physician (Gastroenterology) Lyn Loving, PhD as Consulting Physician (Psychology) Yvone Rush, MD as Consulting Physician (Orthopedic Surgery) Patient Instructions  Refilled adderall today   Continue  efforts to stop tobacco  to reduce risk of cardiovascular events heart attack and strok. Optimize lifestyle intervention healthy eating and exercise .  Muscle exercise good for osteoporosis prevention  Get appt for fasting lab in next weeks  to update  If all ok then 6 mos med check appt .  Can get prevnar 20 vaccine at any time pharmacy other    Katja Blue K. Jaleeyah Munce M.D.

## 2024-06-30 NOTE — Progress Notes (Signed)
 Future labs  ordered freom today cpe  med eval

## 2024-06-30 NOTE — Patient Instructions (Signed)
 Refilled adderall today   Continue  efforts to stop tobacco  to reduce risk of cardiovascular events heart attack and strok. Optimize lifestyle intervention healthy eating and exercise .  Muscle exercise good for osteoporosis prevention  Get appt for fasting lab in next weeks  to update  If all ok then 6 mos med check appt .  Can get prevnar 20 vaccine at any time pharmacy other

## 2024-07-14 ENCOUNTER — Other Ambulatory Visit (INDEPENDENT_AMBULATORY_CARE_PROVIDER_SITE_OTHER)

## 2024-07-14 DIAGNOSIS — Z0000 Encounter for general adult medical examination without abnormal findings: Secondary | ICD-10-CM

## 2024-07-14 DIAGNOSIS — Z79899 Other long term (current) drug therapy: Secondary | ICD-10-CM | POA: Diagnosis not present

## 2024-07-14 DIAGNOSIS — F902 Attention-deficit hyperactivity disorder, combined type: Secondary | ICD-10-CM | POA: Diagnosis not present

## 2024-07-14 DIAGNOSIS — R7301 Impaired fasting glucose: Secondary | ICD-10-CM | POA: Diagnosis not present

## 2024-07-14 DIAGNOSIS — Z720 Tobacco use: Secondary | ICD-10-CM

## 2024-07-14 DIAGNOSIS — E782 Mixed hyperlipidemia: Secondary | ICD-10-CM | POA: Diagnosis not present

## 2024-07-14 DIAGNOSIS — E039 Hypothyroidism, unspecified: Secondary | ICD-10-CM

## 2024-07-14 LAB — CBC WITH DIFFERENTIAL/PLATELET
Basophils Absolute: 0 K/uL (ref 0.0–0.1)
Basophils Relative: 0.6 % (ref 0.0–3.0)
Eosinophils Absolute: 0.2 K/uL (ref 0.0–0.7)
Eosinophils Relative: 4.2 % (ref 0.0–5.0)
HCT: 40.8 % (ref 36.0–46.0)
Hemoglobin: 13.6 g/dL (ref 12.0–15.0)
Lymphocytes Relative: 30.6 % (ref 12.0–46.0)
Lymphs Abs: 1.8 K/uL (ref 0.7–4.0)
MCHC: 33.4 g/dL (ref 30.0–36.0)
MCV: 88.9 fl (ref 78.0–100.0)
Monocytes Absolute: 0.4 K/uL (ref 0.1–1.0)
Monocytes Relative: 6.2 % (ref 3.0–12.0)
Neutro Abs: 3.3 K/uL (ref 1.4–7.7)
Neutrophils Relative %: 58.4 % (ref 43.0–77.0)
Platelets: 278 K/uL (ref 150.0–400.0)
RBC: 4.58 Mil/uL (ref 3.87–5.11)
RDW: 13.5 % (ref 11.5–15.5)
WBC: 5.7 K/uL (ref 4.0–10.5)

## 2024-07-14 LAB — BASIC METABOLIC PANEL WITH GFR
BUN: 19 mg/dL (ref 6–23)
CO2: 26 meq/L (ref 19–32)
Calcium: 9.3 mg/dL (ref 8.4–10.5)
Chloride: 106 meq/L (ref 96–112)
Creatinine, Ser: 1.07 mg/dL (ref 0.40–1.20)
GFR: 56.55 mL/min — ABNORMAL LOW (ref >60.00)
Glucose, Bld: 112 mg/dL — ABNORMAL HIGH (ref 70–99)
Potassium: 4.3 meq/L (ref 3.5–5.1)
Sodium: 140 meq/L (ref 135–145)

## 2024-07-14 LAB — HEPATIC FUNCTION PANEL
ALT: 20 U/L (ref 0–35)
AST: 20 U/L (ref 0–37)
Albumin: 4.4 g/dL (ref 3.5–5.2)
Alkaline Phosphatase: 77 U/L (ref 39–117)
Bilirubin, Direct: 0.1 mg/dL (ref 0.0–0.3)
Total Bilirubin: 0.4 mg/dL (ref 0.2–1.2)
Total Protein: 6.7 g/dL (ref 6.0–8.3)

## 2024-07-14 LAB — LIPID PANEL
Cholesterol: 195 mg/dL (ref 0–200)
HDL: 47.5 mg/dL (ref >39.00)
LDL Cholesterol: 120 mg/dL — ABNORMAL HIGH (ref 0–99)
NonHDL: 147.21
Total CHOL/HDL Ratio: 4
Triglycerides: 135 mg/dL (ref 0.0–149.0)
VLDL: 27 mg/dL (ref 0.0–40.0)

## 2024-07-14 LAB — HEMOGLOBIN A1C: Hgb A1c MFr Bld: 6.2 % (ref 4.6–6.5)

## 2024-07-14 LAB — TSH: TSH: 3.54 u[IU]/mL (ref 0.35–5.50)

## 2024-07-16 LAB — LIPOPROTEIN A (LPA): Lipoprotein (a): <10 nmol/L (ref <75)

## 2024-08-04 ENCOUNTER — Other Ambulatory Visit: Payer: Self-pay | Admitting: Internal Medicine

## 2024-08-04 NOTE — Telephone Encounter (Unsigned)
 Copied from CRM #[id number]. Topic: Clinical - Medication Refill >> Aug 04, 2024 11:07 AM Viola F wrote: Medication: amphetamine -dextroamphetamine (ADDERALL XR) 20 MG 24 hr capsule [[id number]]  ENDED  Has the patient contacted their pharmacy? Yes (Agent: If no, request that the patient contact the pharmacy for the refill. If patient does not wish to contact the pharmacy document the reason why and proceed with request.) (Agent: If yes, when and what did the pharmacy advise?)  This is the patient's preferred pharmacy:  [facility] [id number] - [place], [place] - [address] [place] [address] Phone: [phone] Fax: [phone]  Is this the correct pharmacy for this prescription? Yes If no, delete pharmacy and type the correct one.   Has the prescription been filled recently? Yes  Is the patient out of the medication? No, patient has a couple of days left   Has the patient been seen for an appointment in the last year OR does the patient have an upcoming appointment? Yes  Can we respond through MyChart? Yes  Agent: Please be advised that Rx refills may take up to 3 business days. We ask that you follow-up with your pharmacy.

## 2024-08-05 ENCOUNTER — Ambulatory Visit: Payer: Self-pay | Admitting: Internal Medicine

## 2024-08-05 DIAGNOSIS — E782 Mixed hyperlipidemia: Secondary | ICD-10-CM

## 2024-08-05 MED ORDER — AMPHETAMINE-DEXTROAMPHET ER 20 MG PO CP24: 40.0000 mg | ORAL_CAPSULE | Freq: Every day | ORAL | 0 refills | Status: DC

## 2024-08-05 NOTE — Progress Notes (Signed)
 Blood sugar in pre diabetic range  ldl cholesterol ldl 120  better if lower than 100  If amenable we can try adding zetia 10 mg per day  disp 90 refill x 1  and recheck ;lipid  panel in 4-6 months  .    Rest of labs  stable

## 2024-09-07 ENCOUNTER — Other Ambulatory Visit: Payer: Self-pay | Admitting: Internal Medicine

## 2024-09-07 NOTE — Telephone Encounter (Unsigned)
 Copied from CRM [id number]. Topic: Clinical - Medication Refill >> Sep 07, 2024  1:08 PM Viola F wrote: Medication: amphetamine -dextroamphetamine (ADDERALL XR) 20 MG 24 hr capsule [[id number]]  ENDED  Has the patient contacted their pharmacy? Yes (Agent: If no, request that the patient contact the pharmacy for the refill. If patient does not wish to contact the pharmacy document the reason why and proceed with request.) (Agent: If yes, when and what did the pharmacy advise?)  This is the patient's preferred pharmacy:   [facility] [id number] - [place], [place] - [address] [place] [address] Phone: [phone] Fax: [phone]  [facility] - [place], [place] - [phone] [address] [place] [address] Phone: [phone] Fax: [phone]  Is this the correct pharmacy for this prescription? Yes If no, delete pharmacy and type the correct one.   Has the prescription been filled recently? Yes  Is the patient out of the medication? No, enough until tomorrow  Has the patient been seen for an appointment in the last year OR does the patient have an upcoming appointment? Yes  Can we respond through MyChart? Yes  Agent: Please be advised that Rx refills may take up to 3 business days. We ask that you follow-up with your pharmacy.

## 2024-09-08 MED ORDER — AMPHETAMINE-DEXTROAMPHET ER 20 MG PO CP24: 40.0000 mg | ORAL_CAPSULE | Freq: Every day | ORAL | 0 refills | Status: DC

## 2024-09-22 ENCOUNTER — Other Ambulatory Visit: Payer: Self-pay | Admitting: Family

## 2024-10-08 ENCOUNTER — Other Ambulatory Visit: Payer: Self-pay | Admitting: Internal Medicine

## 2024-10-08 MED ORDER — AMPHETAMINE-DEXTROAMPHET ER 20 MG PO CP24: 40.0000 mg | ORAL_CAPSULE | Freq: Every day | ORAL | 0 refills | Status: DC

## 2024-10-08 NOTE — Telephone Encounter (Unsigned)
 Copied from CRM [id number]. Topic: Clinical - Medication Refill >> Oct 08, 2024  2:25 PM Robinson H wrote: Medication: amphetamine -dextroamphetamine (ADDERALL XR) 20 MG 24 hr capsule  Has the patient contacted their pharmacy? Yes, need provider approval (Agent: If no, request that the patient contact the pharmacy for the refill. If patient does not wish to contact the pharmacy document the reason why and proceed with request.) (Agent: If yes, when and what did the pharmacy advise?)  This is the patient's preferred pharmacy:  [facility] [id number] - [place], [place] - [address] [place] [address] Phone: [phone] Fax: [phone]   Is this the correct pharmacy for this prescription? Yes If no, delete pharmacy and type the correct one.   Has the prescription been filled recently? No  Is the patient out of the medication? No  Has the patient been seen for an appointment in the last year OR does the patient have an upcoming appointment? Yes  Can we respond through MyChart? Yes  Agent: Please be advised that Rx refills may take up to 3 business days. We ask that you follow-up with your pharmacy.

## 2024-10-21 ENCOUNTER — Other Ambulatory Visit: Payer: Self-pay

## 2024-10-21 MED ORDER — FLUOXETINE HCL 20 MG PO CAPS: 40.0000 mg | ORAL_CAPSULE | Freq: Every day | ORAL | 3 refills | Status: AC

## 2024-11-11 ENCOUNTER — Other Ambulatory Visit: Payer: Self-pay | Admitting: Internal Medicine

## 2024-11-11 NOTE — Telephone Encounter (Signed)
 Copied from CRM [id number]. Topic: Clinical - Medication Refill >> Nov 11, 2024  8:31 AM Lonell PEDLAR wrote: Medication: amphetamine -dextroamphetamine (ADDERALL XR) 20 MG 24 hr capsule  Has the patient contacted their pharmacy? No   This is the patient's preferred pharmacy:  [facility] [id number] - [place], [place] - [address] [place] [address] Phone: [phone] Fax: [phone]  Is this the correct pharmacy for this prescription? Yes If no, delete pharmacy and type the correct one.   Has the prescription been filled recently? Yes  Is the patient out of the medication? Yes  Has the patient been seen for an appointment in the last year OR does the patient have an upcoming appointment? Yes  Can we respond through MyChart? Yes  Agent: Please be advised that Rx refills may take up to 3 business days. We ask that you follow-up with your pharmacy.

## 2024-11-12 MED ORDER — AMPHETAMINE-DEXTROAMPHET ER 20 MG PO CP24: 40.0000 mg | ORAL_CAPSULE | Freq: Every day | ORAL | 0 refills | Status: DC

## 2024-12-14 ENCOUNTER — Other Ambulatory Visit: Payer: Self-pay | Admitting: Internal Medicine

## 2024-12-14 DIAGNOSIS — Z1231 Encounter for screening mammogram for malignant neoplasm of breast: Secondary | ICD-10-CM

## 2024-12-17 ENCOUNTER — Other Ambulatory Visit: Payer: Self-pay | Admitting: Internal Medicine

## 2024-12-17 MED ORDER — AMPHETAMINE-DEXTROAMPHET ER 20 MG PO CP24: 40.0000 mg | ORAL_CAPSULE | Freq: Every day | ORAL | 0 refills | Status: DC

## 2024-12-17 NOTE — Telephone Encounter (Signed)
 Copied from CRM #[id number]. Topic: Clinical - Medication Refill >> Dec 17, 2024  9:21 AM Burnard DEL wrote: Medication: amphetamine -dextroamphetamine (ADDERALL XR) 20 MG 24 hr capsule (Expired)  Has the patient contacted their pharmacy? No (Agent: If no, request that the patient contact the pharmacy for the refill. If patient does not wish to contact the pharmacy document the reason why and proceed with request.) (Agent: If yes, when and what did the pharmacy advise?)  This is the patient's preferred pharmacy:  [facility] [id number] - [place], [place] - [address] [place] [address] Phone: [phone] Fax: [phone]    Is this the correct pharmacy for this prescription? Yes If no, delete pharmacy and type the correct one.   Has the prescription been filled recently? No  Is the patient out of the medication? No(3 days left)  Has the patient been seen for an appointment in the last year OR does the patient have an upcoming appointment? Yes  Can we respond through MyChart? Yes  Agent: Please be advised that Rx refills may take up to 3 business days. We ask that you follow-up with your pharmacy.

## 2025-01-05 ENCOUNTER — Ambulatory Visit: Admitting: Internal Medicine

## 2025-01-19 ENCOUNTER — Ambulatory Visit
Admission: RE | Admit: 2025-01-19 | Discharge: 2025-01-19 | Disposition: A | Source: Ambulatory Visit | Attending: Internal Medicine | Admitting: Internal Medicine

## 2025-01-19 DIAGNOSIS — Z1231 Encounter for screening mammogram for malignant neoplasm of breast: Secondary | ICD-10-CM

## 2025-01-21 ENCOUNTER — Ambulatory Visit (INDEPENDENT_AMBULATORY_CARE_PROVIDER_SITE_OTHER): Admitting: Internal Medicine

## 2025-01-21 ENCOUNTER — Encounter: Payer: Self-pay | Admitting: Internal Medicine

## 2025-01-21 VITALS — BP 134/82 | HR 77 | Temp 99.0°F | Ht 65.9 in | Wt 211.6 lb

## 2025-01-21 DIAGNOSIS — F902 Attention-deficit hyperactivity disorder, combined type: Secondary | ICD-10-CM | POA: Diagnosis not present

## 2025-01-21 DIAGNOSIS — E782 Mixed hyperlipidemia: Secondary | ICD-10-CM

## 2025-01-21 DIAGNOSIS — Z820 Family history of epilepsy and other diseases of the nervous system: Secondary | ICD-10-CM

## 2025-01-21 DIAGNOSIS — Z79899 Other long term (current) drug therapy: Secondary | ICD-10-CM | POA: Diagnosis not present

## 2025-01-21 MED ORDER — LEVOTHYROXINE SODIUM 75 MCG PO TABS: 75.0000 ug | ORAL_TABLET | Freq: Every day | ORAL | 3 refills | Status: AC

## 2025-01-21 MED ORDER — ATORVASTATIN CALCIUM 40 MG PO TABS: 40.0000 mg | ORAL_TABLET | Freq: Every day | ORAL | 3 refills | Status: AC

## 2025-01-21 MED ORDER — AMPHETAMINE-DEXTROAMPHET ER 20 MG PO CP24: 40.0000 mg | ORAL_CAPSULE | Freq: Every day | ORAL | 0 refills | Status: AC

## 2025-01-21 NOTE — Patient Instructions (Signed)
 Good to see you   Refill meds  as planned  Continue lifestyle intervention healthy eating and exercise .

## 2025-01-21 NOTE — Progress Notes (Unsigned)
 "  Chief Complaint  Patient presents with   Medical Management of Chronic Issues    HPI: Victoria Carter 60 y.o. come in for Chronic disease Med check  Doing pretty well.  ADDADHD:  still very helpful.  Turns off e mail  at work to avoid distraction.  Optimized work station.  Needs refill Taking action  more involvements  Different church    and  volunteering. Sleep 7 hours d  still tobacco   Father late onset  dementia  Dezarn aricept.   Trying to get  info about dealing with this as family member.  Enjoys seeing grandkids in [name]  ROS: See pertinent positives and negatives per HPI. No cp sob new sx of concern  Past Medical History:  Diagnosis Date   Allergic rhinitis    Depression    Hx of abnormal cervical Pap smear    used cryo when first married and pregnant   Hyperglycemia    Hyperlipidemia    Sinusitis    UTI (lower urinary tract infection)    Vaginitis and vulvovaginitis 11/04/2013   poss early yeast  . rx disc topical otc first and add diflucan  if needed Drug IA discussed     Family History  Problem Relation Age of Onset   Atrial fibrillation Mother    Heart attack Brother        age 51   Diabetes type II Other    Hyperlipidemia Other    Factor V Leiden deficiency Daughter        Also husband    Social History   Socioeconomic History   Marital status: Divorced    Spouse name: Not on file   Number of children: Not on file   Years of education: Not on file   Highest education level: Some college, no degree  Occupational History   Not on file  Tobacco Use   Smoking status: Former    Current packs/day: 0.00    Average packs/day: 0.5 packs/day for 10.0 years (5.0 ttl pk-yrs)    Types: Cigarettes    Start date: 04/27/2012    Quit date: 04/27/2022    Years since quitting: 2.7   Smokeless tobacco: Never   Tobacco comments:    Pt reports still smokes every now and then. During break from work. 06/26/2023. km   Vaping Use   Vaping status: Never Used   Substance and Sexual Activity   Alcohol use: Yes    Alcohol/week: 1.0 standard drink of alcohol    Types: 1 Glasses of wine per week   Drug use: No   Sexual activity: Not on file  Other Topics Concern   Not on file  Social History Narrative   Occupation: 40 per week 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  [employer]  In stress position    Married now separated  02-07-2017  Divorced    Regular exercise- yes  yoga   HH of 1-  girls home from college   Pet dogs passed away 02-08-2016   G2P2   Social Drivers of Health   Tobacco Use: Medium Risk (01/21/2025)   Patient History    Smoking Tobacco Use: Former    Smokeless Tobacco Use: Never    Passive Exposure: Not on file  Financial Resource Strain: Low Risk (01/18/2025)   Overall Financial Resource Strain (CARDIA)    Difficulty of Paying Living Expenses: Not hard at all  Food Insecurity: No Food Insecurity (01/18/2025)   Epic    Worried About Radiation Protection Practitioner of [employer]  in the Last Year: Never true    Ran Out of Food in the Last Year: Never true  Transportation Needs: No Transportation Needs (01/18/2025)   Epic    Lack of Transportation (Medical): No    Lack of Transportation (Non-Medical): No  Physical Activity: Insufficiently Active (01/18/2025)   Exercise Vital Sign    Days of Exercise per Week: 4 days    Minutes of Exercise per Session: 10 min  Stress: No Stress Concern Present (01/18/2025)   [employer] of Occupational Health - Occupational Stress Questionnaire    Feeling of Stress: Only a little  Social Connections: Moderately Integrated (01/18/2025)   Social Connection and Isolation Panel    Frequency of Communication with Friends and Family: Three times a week    Frequency of Social Gatherings with Friends and Family: Once a week    Attends Religious Services: More than 4 times per year    Active Member of Clubs or Organizations: Yes    Attends Banker Meetings: More than 4 times per year    Marital Status: Divorced  Depression (PHQ2-9): Low Risk  (01/21/2025)   Depression (PHQ2-9)    PHQ-2 Score: 1  Alcohol Screen: Low Risk (01/18/2025)   Alcohol Screen    Last Alcohol Screening Score (AUDIT): 4  Housing: Low Risk (01/18/2025)   Epic    Unable to Pay for Housing in the Last Year: No    Number of Times Moved in the Last Year: 0    Homeless in the Last Year: No  Utilities: Not on file  Health Literacy: Not on file    Outpatient Medications Prior to Visit  Medication Sig Dispense Refill   Azelastine HCl 137 MCG/SPRAY SOLN Place into both nostrils. (Patient taking differently: Place into both nostrils as needed.)     Coenzyme Q10 (COQ10) 200 MG CAPS Take by mouth.     FLUoxetine  (PROZAC ) 20 MG capsule Take 2 capsules (40 mg total) by mouth daily. 180 capsule 3   LUTEIN PO Take by mouth.     amphetamine -dextroamphetamine (ADDERALL XR) 20 MG 24 hr capsule Take 2 capsules (40 mg total) by mouth daily. [employer] can do generic  as per insurance 60 capsule 0   atorvastatin  (LIPITOR) 40 MG tablet Take 1 tablet (40 mg total) by mouth daily. 90 tablet 3   levothyroxine  (SYNTHROID ) 75 MCG tablet Take 1 tablet (75 mcg total) by mouth daily with breakfast. 90 tablet 3   Calcium  500 MG CHEW 1 chewable tablet daily (Patient not taking: Reported on 01/21/2025) 30 tablet 6   Multiple Vitamins-Minerals ([employer] W[employer]ENS DAILY GUMMIES) CHEW Chew 2 Units by mouth daily. (Patient not taking: Reported on 01/21/2025) 60 tablet 6   No facility-administered medications prior to visit.     EXAM:  BP 134/82 (BP Location: Left Arm, Patient Position: Sitting, Cuff Size: Large)   Pulse 77   Temp 99 F (37.2 C) (Oral)   Ht 5' 5.9 (1.674 m)   Wt 211 lb 9.6 oz (96 kg)   LMP 04/15/2012   SpO2 97%   BMI 34.26 kg/m   Body mass index is 34.26 kg/m. Wt Readings from Last 3 Encounters:  01/21/25 211 lb 9.6 oz (96 kg)  06/30/24 217 lb 9.6 oz (98.7 kg)  10/29/23 212 lb 12.8 oz (96.5 kg)    GENERAL: vitals reviewed and listed above, alert, oriented,  appears well hydrated and in no acute distress HEENT: atraumatic, conjunctiva  clear, no obvious abnormalities on inspection  of external nose and ears NECK: no obvious masses on inspection palpation  LUNGS: clear to auscultation bilaterally, no wheezes, rales or rhonchi, good air movement CV: HRRR, no clubbing cyanosis or  peripheral edema nl cap refill  MS: moves all extremities without noticeable focal  abnormality PSYCH: pleasant and cooperative, no obvious depression or anxiety Lab Results  Component Value Date   WBC 5.7 07/14/2024   HGB 13.6 07/14/2024   HCT 40.8 07/14/2024   PLT 278.0 07/14/2024   GLUCOSE 112 (H) 07/14/2024   CHOL 195 07/14/2024   TRIG 135.0 07/14/2024   HDL 47.50 07/14/2024   LDLDIRECT 88.6 11/01/2014   LDLCALC 120 (H) 07/14/2024   ALT 20 07/14/2024   AST 20 07/14/2024   NA 140 07/14/2024   K 4.3 07/14/2024   CL 106 07/14/2024   CREATININE 1.07 07/14/2024   BUN 19 07/14/2024   CO2 26 07/14/2024   TSH 3.54 07/14/2024   INR 0.9 07/30/2019   HGBA1C 6.2 07/14/2024   BP Readings from Last 3 Encounters:  01/21/25 134/82  06/30/24 128/74  10/29/23 (!) 144/90    ASSESSMENT AND PLAN:  Discussed the following assessment and plan:  Attention deficit hyperactivity disorder (ADHD), combined type  Medication management  Mixed hyperlipidemia  Family history of Alzheimer disease - father late onset . Benefit more than risk of medications  to continue.Medicaitons   will refill  Glad things are doing better and she has taken action to environment to help her distraction issues and other.  Reminded to  work on [employer]  Last results of labs reviewed . Some info direction on dementia  dsicussion Plan for fu    -Patient advised to return or notify health care team  if  new concerns arise.  Patient Instructions  Good to see you   Refill meds  as planned  Continue lifestyle intervention healthy eating and exercise .      Gratia Disla K. Dorann Davidson M.D. "
